# Patient Record
Sex: Female | Born: 1960
Health system: Southern US, Community
[De-identification: ages and names within clinical notes are randomized; demographics above are authoritative.]

## PROBLEM LIST (undated history)

## (undated) DIAGNOSIS — E079 Disorder of thyroid, unspecified: Secondary | ICD-10-CM

## (undated) DIAGNOSIS — I10 Essential (primary) hypertension: Secondary | ICD-10-CM

## (undated) DIAGNOSIS — I639 Cerebral infarction, unspecified: Secondary | ICD-10-CM

## (undated) DIAGNOSIS — B029 Zoster without complications: Secondary | ICD-10-CM

## (undated) HISTORY — DX: Zoster without complications: B02.9

## (undated) HISTORY — DX: Disorder of thyroid, unspecified: E07.9

## (undated) HISTORY — DX: Essential (primary) hypertension: I10

## (undated) HISTORY — DX: Cerebral infarction, unspecified: I63.9

---

## 2000-09-14 ENCOUNTER — Encounter: Admission: RE | Admit: 2000-09-14 | Discharge: 2000-09-14 | Payer: Self-pay | Admitting: Obstetrics and Gynecology

## 2000-09-14 ENCOUNTER — Encounter: Payer: Self-pay | Admitting: Obstetrics and Gynecology

## 2004-09-22 ENCOUNTER — Encounter: Admission: RE | Admit: 2004-09-22 | Discharge: 2004-09-22 | Payer: Self-pay | Admitting: Obstetrics and Gynecology

## 2005-12-15 ENCOUNTER — Encounter: Admission: RE | Admit: 2005-12-15 | Discharge: 2005-12-15 | Payer: Self-pay | Admitting: Obstetrics and Gynecology

## 2006-01-05 ENCOUNTER — Encounter: Admission: RE | Admit: 2006-01-05 | Discharge: 2006-01-05 | Payer: Self-pay | Admitting: Obstetrics and Gynecology

## 2009-04-22 ENCOUNTER — Encounter: Admission: RE | Admit: 2009-04-22 | Discharge: 2009-04-22 | Payer: Self-pay | Admitting: Obstetrics and Gynecology

## 2010-05-06 ENCOUNTER — Encounter: Admission: RE | Admit: 2010-05-06 | Discharge: 2010-05-06 | Payer: Self-pay | Admitting: Obstetrics and Gynecology

## 2011-01-11 ENCOUNTER — Encounter: Payer: Self-pay | Admitting: Obstetrics and Gynecology

## 2013-01-18 ENCOUNTER — Other Ambulatory Visit: Payer: Self-pay | Admitting: Family Medicine

## 2013-01-18 DIAGNOSIS — Z1231 Encounter for screening mammogram for malignant neoplasm of breast: Secondary | ICD-10-CM

## 2013-03-27 ENCOUNTER — Ambulatory Visit
Admission: RE | Admit: 2013-03-27 | Discharge: 2013-03-27 | Disposition: A | Discharge status: Home / Self Care | Payer: BC Managed Care – PPO | Source: Ambulatory Visit | Attending: Family Medicine | Admitting: Family Medicine

## 2013-03-27 DIAGNOSIS — Z1231 Encounter for screening mammogram for malignant neoplasm of breast: Secondary | ICD-10-CM

## 2013-04-11 ENCOUNTER — Encounter: Payer: Self-pay | Admitting: Nurse Practitioner

## 2013-04-11 ENCOUNTER — Ambulatory Visit (INDEPENDENT_AMBULATORY_CARE_PROVIDER_SITE_OTHER): Payer: BC Managed Care – PPO | Admitting: Nurse Practitioner

## 2013-04-11 VITALS — BP 153/98 | HR 98 | Temp 97.0°F | Ht 62.0 in | Wt 176.0 lb

## 2013-04-11 DIAGNOSIS — Z124 Encounter for screening for malignant neoplasm of cervix: Secondary | ICD-10-CM

## 2013-04-11 DIAGNOSIS — Z139 Encounter for screening, unspecified: Secondary | ICD-10-CM

## 2013-04-11 DIAGNOSIS — Z0000 Encounter for general adult medical examination without abnormal findings: Secondary | ICD-10-CM

## 2013-04-11 DIAGNOSIS — I10 Essential (primary) hypertension: Secondary | ICD-10-CM | POA: Insufficient documentation

## 2013-04-11 LAB — POCT CBC
Granulocyte percent: 72.5 %G (ref 37–80)
HCT, POC: 45 % (ref 37.7–47.9)
Hemoglobin: 15.4 g/dL (ref 12.2–16.2)
MCV: 88.4 fL (ref 80–97)
POC Granulocyte: 7.2 — AB (ref 2–6.9)
RBC: 5.1 M/uL (ref 4.04–5.48)
WBC: 10 10*3/uL (ref 4.6–10.2)

## 2013-04-11 LAB — COMPLETE METABOLIC PANEL WITH GFR
Albumin: 4.7 g/dL (ref 3.5–5.2)
BUN: 11 mg/dL (ref 6–23)
CO2: 28 mEq/L (ref 19–32)
Calcium: 10.2 mg/dL (ref 8.4–10.5)
Chloride: 96 mEq/L (ref 96–112)
GFR, Est Non African American: 81 mL/min
Glucose, Bld: 106 mg/dL — ABNORMAL HIGH (ref 70–99)
Potassium: 4 mEq/L (ref 3.5–5.3)
Sodium: 136 mEq/L (ref 135–145)
Total Protein: 8 g/dL (ref 6.0–8.3)

## 2013-04-11 LAB — THYROID PANEL WITH TSH
Free Thyroxine Index: 1.3 (ref 1.0–3.9)
T3 Uptake: 24.2 % (ref 22.5–37.0)

## 2013-04-11 LAB — POCT UA - MICROSCOPIC ONLY
Crystals, Ur, HPF, POC: NEGATIVE
Mucus, UA: NEGATIVE

## 2013-04-11 LAB — POCT URINALYSIS DIPSTICK
Glucose, UA: NEGATIVE
Ketones, UA: NEGATIVE
Spec Grav, UA: <=1.005
Urobilinogen, UA: NEGATIVE

## 2013-04-11 MED ORDER — HYDROCHLOROTHIAZIDE 25 MG PO TABS: 25.0000 mg | ORAL_TABLET | Freq: Every day | ORAL | Status: DC

## 2013-04-11 NOTE — Patient Instructions (Signed)

## 2013-04-11 NOTE — Progress Notes (Signed)
Subjective:    Patient ID: Brooke Newton, female    DOB: [birth year], 52 y.o.   MRN: [id number]  HPIPatient here today for a CPE. She has had Hypertension in the past and was taking Diazide. Patient lost a bunch of weight several years ago and stopped taking. Has gained some of that weight back B/P has now gone back up.     Review of Systems  Constitutional: Negative.   HENT: Negative.   Eyes:       Lesion right inner canthus \. Has been there for 4 months but has not changed in size.  Respiratory: Negative.   Cardiovascular: Negative.   Genitourinary: Negative.   Musculoskeletal: Negative.   Neurological: Negative.   Psychiatric/Behavioral: Negative.        Objective:   Physical Exam  Constitutional: She is oriented to person, place, and time. She appears well-developed and well-nourished.  HENT:  Head: Normocephalic.  Right Ear: Hearing, tympanic membrane, external ear and ear canal normal.  Left Ear: Hearing, tympanic membrane, external ear and ear canal normal.  Nose: Nose normal.  Mouth/Throat: Uvula is midline and oropharynx is clear and moist.  Eyes: Conjunctivae and EOM are normal. Pupils are equal, round, and reactive to light.  Neck: Normal range of motion and full passive range of motion without pain. Neck supple. No JVD present. Carotid bruit is not present. No mass and no thyromegaly present.  Cardiovascular: Normal rate, normal heart sounds and intact distal pulses.   No murmur heard. Pulmonary/Chest: Effort normal and breath sounds normal. Right breast exhibits no inverted nipple, no mass, no nipple discharge, no skin change and no tenderness. Left breast exhibits no inverted nipple, no mass, no nipple discharge, no skin change and no tenderness.  Abdominal: Soft. Bowel sounds are normal. She exhibits no mass. There is no tenderness.  Genitourinary: Vagina normal and uterus normal. No breast swelling, tenderness, discharge or bleeding.  bimanual exam-No adnexal  masses or tenderness.  Cervical stenosis and pink, no discharge  Musculoskeletal: Normal range of motion.  Lymphadenopathy:    She has no cervical adenopathy.  Neurological: She is alert and oriented to person, place, and time.  Skin: Skin is warm and dry.  Psychiatric: She has a normal mood and affect. Her behavior is normal. Judgment and thought content normal.    BP 153/98  Pulse 98  Temp(Src) 97 F (36.1 C) (Oral)  Ht 5\' 2"  (1.575 m)  Wt 176 lb (79.833 kg)  BMI 32.18 kg/m2 Results for orders placed in visit on 04/11/13  POCT URINALYSIS DIPSTICK      Result Value Range   Color, UA Clear     Clarity, UA Clear     Glucose, UA neg     Bilirubin, UA neg     Ketones, UA neg     Spec Grav, UA <=1.005     Blood, UA trace     pH, UA 7.0     Protein, UA neg     Urobilinogen, UA negative     Nitrite, UA neg     Leukocytes, UA large (3+)    POCT UA - MICROSCOPIC ONLY      Result Value Range   WBC, Ur, HPF, POC 1-5     RBC, urine, microscopic 1-3     Bacteria, U Microscopic occ     Mucus, UA neg     Epithelial cells, urine per micros few     Crystals, Ur, HPF, POC neg  Casts, Ur, LPF, POC neg     Yeast, UA neg           Assessment & Plan:  1. Annual physical exam Diet and exercise encouraged - POCT urinalysis dipstick - POCT UA - Microscopic Only - POCT CBC - COMPLETE METABOLIC PANEL WITH GFR - NMR Lipoprofile with Lipids - Thyroid Panel With TSH - Pap IG w/ reflex to HPV when ASC-U  2. Essential hypertension, benign Low Na+ diet - hydrochlorothiazide (HYDRODIURIL) 25 MG tablet; Take 1 tablet (25 mg total) by mouth daily.  Dispense: 90 tablet; Refill: 1 Colonoscopy scheduled for May 2014 Will schedule Dexa on way out todya Bennie Pierini, FNP

## 2013-04-12 LAB — NMR LIPOPROFILE WITH LIPIDS
HDL Size: 8.6 nm — ABNORMAL LOW (ref >=9.2)
LDL Particle Number: 2199 nmol/L — ABNORMAL HIGH (ref <1000)
Large HDL-P: 3.3 umol/L — ABNORMAL LOW (ref >=4.8)
Large VLDL-P: 4 nmol/L — ABNORMAL HIGH (ref <=2.7)
Small LDL Particle Number: 1155 nmol/L — ABNORMAL HIGH (ref <=527)
Triglycerides: 108 mg/dL (ref <150)
VLDL Size: 49.5 nm — ABNORMAL HIGH (ref <=46.6)

## 2013-04-13 LAB — PAP IG W/ RFLX HPV ASCU

## 2013-04-17 ENCOUNTER — Other Ambulatory Visit: Payer: Self-pay | Admitting: Nurse Practitioner

## 2013-04-17 LAB — HUMAN PAPILLOMAVIRUS, HIGH RISK: HPV DNA High Risk: NOT DETECTED

## 2013-04-17 MED ORDER — LEVOTHYROXINE SODIUM 75 MCG PO TABS: 75.0000 ug | ORAL_TABLET | Freq: Every day | ORAL | Status: DC

## 2013-05-16 ENCOUNTER — Encounter: Payer: Self-pay | Admitting: Physician Assistant

## 2013-05-16 ENCOUNTER — Ambulatory Visit (INDEPENDENT_AMBULATORY_CARE_PROVIDER_SITE_OTHER): Payer: BC Managed Care – PPO | Admitting: Physician Assistant

## 2013-05-16 VITALS — BP 156/87 | HR 81 | Temp 97.2°F | Ht 62.0 in | Wt 180.2 lb

## 2013-05-16 DIAGNOSIS — H9202 Otalgia, left ear: Secondary | ICD-10-CM

## 2013-05-16 DIAGNOSIS — E039 Hypothyroidism, unspecified: Secondary | ICD-10-CM

## 2013-05-16 DIAGNOSIS — H9209 Otalgia, unspecified ear: Secondary | ICD-10-CM

## 2013-05-16 NOTE — Patient Instructions (Signed)
Serous Otitis Media   Serous otitis media is also known as otitis media with effusion (OME). It means there is fluid in the middle ear space. This space contains the bones for hearing and air. Air in the middle ear space helps to transmit sound.   The air gets there through the eustachian tube. This tube goes from the back of the throat to the middle ear space. It keeps the pressure in the middle ear the same as the outside world. It also helps to drain fluid from the middle ear space.  CAUSES   OME occurs when the eustachian tube gets blocked. Blockage can come from:  · Ear infections.  · Colds and other upper respiratory infections.  · Allergies.  · Irritants such as cigarette smoke.  · Sudden changes in air pressure (such as descending in an airplane).  · Enlarged adenoids.  During colds and upper respiratory infections, the middle ear space can become temporarily filled with fluid. This can happen after an ear infection also. Once the infection clears, the fluid will generally drain out of the ear through the eustachian tube. If it does not, then OME occurs.  SYMPTOMS   · Hearing loss.  · A feeling of fullness in the ear  but no pain.  · Young children may not show any symptoms.  DIAGNOSIS   · Diagnosis of OME is made by an ear exam.  · Tests may be done to check on the movement of the eardrum.  · Hearing exams may be done.  TREATMENT   · The fluid most often goes away without treatment.  · If allergy is the cause, allergy treatment may be helpful.  · Fluid that persists for several months may require minor surgery. A small tube is placed in the ear drum to:  · Drain the fluid.  · Restore the air in the middle ear space.  · In certain situations, antibiotics are used to avoid surgery.  · Surgery may be done to remove enlarged adenoids (if this is the cause).  HOME CARE INSTRUCTIONS   · Keep children away from tobacco smoke.  · Be sure to keep follow up appointments, if any.  SEEK MEDICAL CARE IF:   · Hearing is  not better in 3 months.  · Hearing is worse.  · Ear pain.  · Drainage from the ear.  · Dizziness.  Document Released: 02/27/2004 Document Revised: 02/29/2012 Document Reviewed: 12/27/2008  [employer]® Patient Information ©2014 [employer].

## 2013-05-16 NOTE — Progress Notes (Signed)
Subjective:     Patient ID: Brooke Newton, female   DOB: Mar 20, 1961, 52 y.o.   MRN: [id number]  HPI Pt with intermit pain to the L and R ear L>R She states sx happen same time each yr Also with PND, scratchy throat, and dry cough No fever/chills No drainage from the ear  Review of Systems  All other systems reviewed and are negative.       Objective:   Physical Exam  Nursing note and vitals reviewed. HENT:  Right Ear: External ear normal. Tympanic membrane is not retracted and not bulging. A middle ear effusion is present.  Left Ear: External ear normal. Tympanic membrane is not retracted and not bulging. A middle ear effusion is present.  Nose: Nose normal.  Mouth/Throat: Uvula is midline and oropharynx is clear and moist. No oral lesions. No edematous. No oropharyngeal exudate, posterior oropharyngeal edema or posterior oropharyngeal erythema.  Cardiovascular: Normal rate, regular rhythm and normal heart sounds.   Pulmonary/Chest: Effort normal and breath sounds normal.  Lymphadenopathy:    She has no cervical adenopathy.       Assessment:     1. Ear ache, left        Plan:     OTC antihist Fluids rest Short course of Afrin Hold oral decongestants due to HTN F/U prn

## 2013-06-27 ENCOUNTER — Telehealth: Payer: Self-pay | Admitting: Nurse Practitioner

## 2013-06-27 DIAGNOSIS — Z13820 Encounter for screening for osteoporosis: Secondary | ICD-10-CM

## 2013-06-27 NOTE — Telephone Encounter (Signed)
Referral made- someone should be calling you

## 2013-06-28 ENCOUNTER — Other Ambulatory Visit: Payer: BC Managed Care – PPO

## 2013-06-28 ENCOUNTER — Ambulatory Visit: Payer: BC Managed Care – PPO

## 2013-07-05 ENCOUNTER — Encounter: Payer: Self-pay | Admitting: *Deleted

## 2013-07-26 ENCOUNTER — Ambulatory Visit: Payer: BC Managed Care – PPO

## 2013-07-26 ENCOUNTER — Other Ambulatory Visit: Payer: BC Managed Care – PPO

## 2013-08-02 ENCOUNTER — Ambulatory Visit: Payer: Self-pay

## 2013-08-02 ENCOUNTER — Other Ambulatory Visit: Payer: Self-pay

## 2013-08-09 ENCOUNTER — Other Ambulatory Visit: Payer: Self-pay

## 2013-08-09 ENCOUNTER — Ambulatory Visit: Payer: Self-pay

## 2013-09-18 ENCOUNTER — Other Ambulatory Visit: Payer: Self-pay | Admitting: Nurse Practitioner

## 2013-09-26 ENCOUNTER — Other Ambulatory Visit: Payer: Self-pay | Admitting: Nurse Practitioner

## 2013-09-27 NOTE — Telephone Encounter (Signed)
Last TSH on 04/17/13 says to recheck in 6 wks

## 2013-09-28 ENCOUNTER — Inpatient Hospital Stay (HOSPITAL_COMMUNITY)
Admission: EM | Admit: 2013-09-28 | Discharge: 2013-09-30 | DRG: 014 | Disposition: A | Discharge status: Home Health | Payer: BC Managed Care – PPO | Attending: Internal Medicine | Admitting: Internal Medicine

## 2013-09-28 ENCOUNTER — Emergency Department (HOSPITAL_COMMUNITY): Payer: BC Managed Care – PPO

## 2013-09-28 ENCOUNTER — Encounter (HOSPITAL_COMMUNITY): Payer: Self-pay | Admitting: Emergency Medicine

## 2013-09-28 ENCOUNTER — Observation Stay (HOSPITAL_COMMUNITY): Payer: BC Managed Care – PPO

## 2013-09-28 DIAGNOSIS — M6281 Muscle weakness (generalized): Secondary | ICD-10-CM

## 2013-09-28 DIAGNOSIS — I10 Essential (primary) hypertension: Secondary | ICD-10-CM | POA: Diagnosis present

## 2013-09-28 DIAGNOSIS — I639 Cerebral infarction, unspecified: Secondary | ICD-10-CM

## 2013-09-28 DIAGNOSIS — E669 Obesity, unspecified: Secondary | ICD-10-CM | POA: Diagnosis present

## 2013-09-28 DIAGNOSIS — G8190 Hemiplegia, unspecified affecting unspecified side: Secondary | ICD-10-CM | POA: Diagnosis present

## 2013-09-28 DIAGNOSIS — Z8673 Personal history of transient ischemic attack (TIA), and cerebral infarction without residual deficits: Secondary | ICD-10-CM | POA: Diagnosis present

## 2013-09-28 DIAGNOSIS — T502X5A Adverse effect of carbonic-anhydrase inhibitors, benzothiadiazides and other diuretics, initial encounter: Secondary | ICD-10-CM | POA: Diagnosis present

## 2013-09-28 DIAGNOSIS — E785 Hyperlipidemia, unspecified: Secondary | ICD-10-CM | POA: Diagnosis present

## 2013-09-28 DIAGNOSIS — I6350 Cerebral infarction due to unspecified occlusion or stenosis of unspecified cerebral artery: Principal | ICD-10-CM | POA: Diagnosis present

## 2013-09-28 DIAGNOSIS — I6930 Unspecified sequelae of cerebral infarction: Secondary | ICD-10-CM | POA: Diagnosis present

## 2013-09-28 DIAGNOSIS — R531 Weakness: Secondary | ICD-10-CM

## 2013-09-28 DIAGNOSIS — Z79899 Other long term (current) drug therapy: Secondary | ICD-10-CM

## 2013-09-28 DIAGNOSIS — I498 Other specified cardiac arrhythmias: Secondary | ICD-10-CM | POA: Diagnosis present

## 2013-09-28 DIAGNOSIS — E039 Hypothyroidism, unspecified: Secondary | ICD-10-CM | POA: Diagnosis present

## 2013-09-28 DIAGNOSIS — E876 Hypokalemia: Secondary | ICD-10-CM | POA: Diagnosis present

## 2013-09-28 LAB — POCT I-STAT TROPONIN I

## 2013-09-28 LAB — DIFFERENTIAL
Eosinophils Relative: 1 % (ref 0–5)
Lymphocytes Relative: 17 % (ref 12–46)
Lymphs Abs: 2 10*3/uL (ref 0.7–4.0)
Monocytes Absolute: 0.6 10*3/uL (ref 0.1–1.0)
Monocytes Relative: 5 % (ref 3–12)
Neutro Abs: 9 10*3/uL — ABNORMAL HIGH (ref 1.7–7.7)

## 2013-09-28 LAB — COMPREHENSIVE METABOLIC PANEL
ALT: 35 U/L (ref 0–35)
Albumin: 3.8 g/dL (ref 3.5–5.2)
Alkaline Phosphatase: 88 U/L (ref 39–117)
BUN: 8 mg/dL (ref 6–23)
CO2: 26 mEq/L (ref 19–32)
Chloride: 95 mEq/L — ABNORMAL LOW (ref 96–112)
GFR calc non Af Amer: >90 mL/min (ref >90)
Glucose, Bld: 118 mg/dL — ABNORMAL HIGH (ref 70–99)
Potassium: 2.7 mEq/L — CL (ref 3.5–5.1)
Sodium: 135 mEq/L (ref 135–145)
Total Protein: 7.7 g/dL (ref 6.0–8.3)

## 2013-09-28 LAB — CBC
HCT: 38.2 % (ref 36.0–46.0)
Hemoglobin: 12.9 g/dL (ref 12.0–15.0)
MCV: 86.6 fL (ref 78.0–100.0)
WBC: 11.6 10*3/uL — ABNORMAL HIGH (ref 4.0–10.5)

## 2013-09-28 LAB — APTT: aPTT: 26 seconds (ref 24–37)

## 2013-09-28 MED ORDER — ENOXAPARIN SODIUM 40 MG/0.4ML ~~LOC~~ SOLN
40.0000 mg | SUBCUTANEOUS | Status: DC
  Filled 2013-09-28: qty 0.4

## 2013-09-28 MED ORDER — ASPIRIN 325 MG PO TABS
325.0000 mg | ORAL_TABLET | Freq: Every day | ORAL | Status: DC
  Administered 2013-09-29 – 2013-09-30 (×2): 325 mg via ORAL
  Filled 2013-09-28 (×2): qty 1

## 2013-09-28 MED ORDER — ASPIRIN 300 MG RE SUPP: 300.0000 mg | Freq: Every day | RECTAL | Status: DC

## 2013-09-28 MED ORDER — ASPIRIN 325 MG PO TABS
325.0000 mg | ORAL_TABLET | Freq: Once | ORAL | Status: AC
  Administered 2013-09-28: 325 mg via ORAL
  Filled 2013-09-28: qty 1

## 2013-09-28 MED ORDER — POTASSIUM CHLORIDE CRYS ER 20 MEQ PO TBCR
40.0000 meq | EXTENDED_RELEASE_TABLET | Freq: Once | ORAL | Status: AC
  Administered 2013-09-28: 40 meq via ORAL
  Filled 2013-09-28: qty 2

## 2013-09-28 MED ORDER — ASPIRIN 300 MG RE SUPP
300.0000 mg | Freq: Every day | RECTAL | Status: DC
  Filled 2013-09-28 (×3): qty 1

## 2013-09-28 MED ORDER — STUDY - INVESTIGATIONAL DRUG SIMPLE RECORD
75.0000 mg | Freq: Every day | Status: DC
  Administered 2013-09-29 – 2013-09-30 (×2): 75 mg via ORAL
  Filled 2013-09-28 (×2): qty 75

## 2013-09-28 MED ORDER — STUDY - INVESTIGATIONAL DRUG SIMPLE RECORD
600.0000 mg | Status: AC
  Administered 2013-09-28: 600 mg via ORAL
  Filled 2013-09-28: qty 600

## 2013-09-28 MED ORDER — HYDROCHLOROTHIAZIDE 25 MG PO TABS
25.0000 mg | ORAL_TABLET | Freq: Every day | ORAL | Status: DC
  Administered 2013-09-29: 25 mg via ORAL
  Filled 2013-09-28 (×3): qty 1

## 2013-09-28 MED ORDER — SODIUM CHLORIDE 0.9 % IV BOLUS (SEPSIS)
1000.0000 mL | Freq: Once | INTRAVENOUS | Status: AC
  Administered 2013-09-28: 1000 mL via INTRAVENOUS

## 2013-09-28 MED ORDER — ACETAMINOPHEN 650 MG RE SUPP: 650.0000 mg | RECTAL | Status: DC | PRN

## 2013-09-28 MED ORDER — POTASSIUM CHLORIDE 10 MEQ/100ML IV SOLN
10.0000 meq | INTRAVENOUS | Status: AC
  Administered 2013-09-28: 10 meq via INTRAVENOUS
  Filled 2013-09-28: qty 100

## 2013-09-28 MED ORDER — ACETAMINOPHEN 325 MG PO TABS: 650.0000 mg | ORAL_TABLET | ORAL | Status: DC | PRN

## 2013-09-28 MED ORDER — LEVOTHYROXINE SODIUM 75 MCG PO TABS
75.0000 ug | ORAL_TABLET | Freq: Every day | ORAL | Status: DC
  Administered 2013-09-29 – 2013-09-30 (×2): 75 ug via ORAL
  Filled 2013-09-28 (×3): qty 1

## 2013-09-28 MED ORDER — SENNOSIDES-DOCUSATE SODIUM 8.6-50 MG PO TABS: 1.0000 | ORAL_TABLET | Freq: Every evening | ORAL | Status: DC | PRN

## 2013-09-28 MED ORDER — SODIUM CHLORIDE 0.9 % IV SOLN
INTRAVENOUS | Status: DC
  Administered 2013-09-28: 22:00:00 via INTRAVENOUS

## 2013-09-28 NOTE — ED Notes (Signed)
Pt stated her arm hurt from the K infusion, rate changed to 31ml/hr.

## 2013-09-28 NOTE — ED Notes (Signed)
CT read and interpreted by neurologist.

## 2013-09-28 NOTE — ED Notes (Signed)
Neurologist arrived while pt in CT

## 2013-09-28 NOTE — ED Notes (Signed)
Pt arrived in CT 

## 2013-09-28 NOTE — ED Notes (Signed)
Phlebotomist arrived at ED bridge.

## 2013-09-28 NOTE — ED Notes (Signed)
Stroke nurse arrived in pt room

## 2013-09-28 NOTE — ED Notes (Signed)
Pt arrived in room

## 2013-09-28 NOTE — H&P (Signed)
Patient's PCP: Rudi Heap, MD  Chief Complaint: Left-sided weakness and heaviness  History of Present Illness: Brooke Newton is a 52 y.o. Caucasian female with history of hypertension, obesity, and hypothyroidism who presents with the above complaints.  Patient reports that she was in her usual state of health until 4 p.m. today when she noted left-sided weakness and heaviness.  Code stroke was called and she was brought to the emergency department.  Patient was evaluated by neurology, TPA was not given due to her minimal symptoms.  During the emergency department stay, her left-sided weakness and heaviness have improved with still persistent.  Hospitalist service was asked to admit the patient for further care and management.  She denies any recent fevers, chills, nausea, vomiting, chest pain, shortness of breath, abdominal pain, diarrhea, headaches or vision changes.  Patient did not have any slurred speech or difficulty swallowing.  Review of Systems: All systems reviewed with the patient and positive as per history of present illness, otherwise all other systems are negative.  Past Medical History  Diagnosis Date  . Hypertension   . Thyroid disease     hypothyroidism  . Shingles At age 34   Past Surgical History  Procedure Laterality Date  . Cesarean section     Family History  Problem Relation Age of Onset  . Hypertension Mother   . Stroke Mother   . Hypertension Brother    History   Social History  . Marital Status: Married    Spouse Name: N/A    Number of Children: N/A  . Years of Education: N/A   Occupational History  . Not on file.   Social History Main Topics  . Smoking status: Never Smoker   . Smokeless tobacco: Never Used  . Alcohol Use: No  . Drug Use: No  . Sexual Activity: Yes   Other Topics Concern  . Not on file   Social History Narrative  . No narrative on file   Allergies: Review of patient's allergies indicates no known allergies.  Home  Meds: Prior to Admission medications   Medication Sig Start Date End Date Taking? Authorizing Provider  hydrochlorothiazide (HYDRODIURIL) 25 MG tablet TAKE 1 TABLET DAILY 09/18/13   Mary-Margaret Daphine Deutscher, FNP  levothyroxine (SYNTHROID, LEVOTHROID) 75 MCG tablet TAKE 1 TABLET DAILY 09/26/13   Mary-Margaret Daphine Deutscher, FNP    Physical Exam: Blood pressure 130/76, pulse 105, temperature 98.3 F (36.8 C), resp. rate 17, SpO2 98.00%. General: Awake, Oriented x3, No acute distress. HEENT: EOMI, Moist mucous membranes Neck: Supple CV: S1 and S2, slightly tachycardic. Lungs: Clear to ascultation bilaterally Abdomen: Soft, Nontender, Nondistended, +bowel sounds. Ext: Good pulses. Trace edema. No clubbing or cyanosis noted. Neuro: Cranial Nerves II-XII grossly intact. Has 5/5 motor strength in upper and lower extremities, except patient complaining of mild "heaviness"on the left.  Lab results:  Recent Labs  09/28/13 1802  NA 135  K 2.7*  CL 95*  CO2 26  GLUCOSE 118*  BUN 8  CREATININE 0.71  CALCIUM 9.5    Recent Labs  09/28/13 1802  AST 31  ALT 35  ALKPHOS 88  BILITOT 0.3  PROT 7.7  ALBUMIN 3.8   No results found for this basename: LIPASE, AMYLASE,  in the last 72 hours  Recent Labs  09/28/13 1802  WBC 11.6*  NEUTROABS 9.0*  HGB 12.9  HCT 38.2  MCV 86.6  PLT 313    Recent Labs  09/28/13 1743  TROPONINI <0.30   No components found with  this basename: POCBNP,  No results found for this basename: DDIMER,  in the last 72 hours No results found for this basename: HGBA1C,  in the last 72 hours No results found for this basename: CHOL, HDL, LDLCALC, TRIG, CHOLHDL, LDLDIRECT,  in the last 72 hours No results found for this basename: TSH, T4TOTAL, FREET3, T3FREE, THYROIDAB,  in the last 72 hours No results found for this basename: VITAMINB12, FOLATE, FERRITIN, TIBC, IRON, RETICCTPCT,  in the last 72 hours Imaging results:  Ct Head (brain) Wo Contrast  09/28/2013   CLINICAL  DATA:  Left-sided weakness.  Code stroke.  EXAM: CT HEAD WITHOUT CONTRAST  TECHNIQUE: Contiguous axial images were obtained from the base of the skull through the vertex without intravenous contrast.  COMPARISON:  None.  FINDINGS: The ventricles are normal in size and configuration. There are no parenchymal masses or mass effect. Minor periventricular white matter hypoattenuation is noted most consistent with chronic microvascular ischemic change.  There is no evidence of a recent transcortical infarct.  No extra-axial masses or abnormal fluid collections.  There is no intracranial hemorrhage.  The visualized sinuses and mastoid air cells are clear.  IMPRESSION: 1. No acute intracranial abnormalities. No intracranial hemorrhage. 2. Minor chronic microvascular ischemic change. Exam otherwise unremarkable. Critical Value/emergent results were called by telephone at the time of interpretation on 09/28/2013 at 5:47 PM to Dr.Yao, who verbally acknowledged these results.   Electronically Signed   By: Amie Portland M.D.   On: 09/28/2013 17:48   Other results: EKG: Sinus tachycardia with heart rate of 109.  Assessment & Plan by Problem: Left-sided weakness and heaviness Unclear etiology.  Will initiate stroke workup, will get an MRI of the brain, carotid Dopplers, 2-D echocardiogram.  Request physical therapy, occupational therapy and speech therapy consultation.  Start patient on aspirin.  Check lipid panel and hemoglobin A1c.  Continue neuro checks.  Hypertension Blood pressure slightly elevated, allow for permissive hypertension.  Will gently hydrate the patient with normal saline.  Continue hydrochlorothiazide for now.  Tachycardia Likely related to anxiety.  Continue to monitor.  Hypothyroidism Continue levothyroxine.  Check TSH and free T4 in the morning.  Hypokalemia Etiology unclear.  Replace as needed.  May be related to hydrochlorothiazide.  Check magnesium in the  morning.  Prophylaxis Lovenox.  CODE STATUS Full code  Disposition Admit the patient to telemetry as observation.  Time spent on admission, talking to the patient, and coordinating care was: 50 mins.  Dawnna Gritz A, MD 09/28/2013, 7:35 PM

## 2013-09-28 NOTE — ED Provider Notes (Signed)
CSN: [id number]     Arrival date & time 09/28/13  1725 History   First MD Initiated Contact with Patient 09/28/13 1727     Chief Complaint  Patient presents with  . Code Stroke   (Consider location/radiation/quality/duration/timing/severity/associated sxs/prior Treatment) The history is provided by the patient and the EMS personnel.  Brooke Newton is a 52 y.o. female hx of HTN, hypothyroidism here with L sided weakness and numbness. L arm weakness and numbness started acutely at 4:05 pm while she was working. Denies any speech problems. Denies blurry vision. Came in as code stroke. Has family history of stroke but no personal history of stroke.    Past Medical History  Diagnosis Date  . Hypertension   . Thyroid disease     hypothyroidism  . Shingles At age 89   Past Surgical History  Procedure Laterality Date  . Cesarean section     Family History  Problem Relation Age of Onset  . Hypertension Mother   . Stroke Mother   . Hypertension Brother    History  Substance Use Topics  . Smoking status: Never Smoker   . Smokeless tobacco: Never Used  . Alcohol Use: No   OB History   Grav Para Term Preterm Abortions TAB SAB Ect Mult Living                 Review of Systems  Neurological: Positive for weakness and numbness.  All other systems reviewed and are negative.    Allergies  Review of patient's allergies indicates no known allergies.  Home Medications   Current Outpatient Rx  Name  Route  Sig  Dispense  Refill  . hydrochlorothiazide (HYDRODIURIL) 25 MG tablet      TAKE 1 TABLET DAILY   90 tablet   1   . levothyroxine (SYNTHROID, LEVOTHROID) 75 MCG tablet      TAKE 1 TABLET DAILY   90 tablet   0    BP 130/76  Pulse 105  Temp(Src) 98.3 F (36.8 C)  Resp 17  SpO2 98% Physical Exam  Nursing note and vitals reviewed. Constitutional: She is oriented to person, place, and time. She appears well-developed and well-nourished.  HENT:  Head:  Normocephalic.  Mouth/Throat: Oropharynx is clear and moist.  Eyes: Conjunctivae are normal. Pupils are equal, round, and reactive to light.  Neck: Normal range of motion. Neck supple.  Cardiovascular: Normal rate, regular rhythm and normal heart sounds.   Pulmonary/Chest: Effort normal and breath sounds normal. No respiratory distress. She has no wheezes. She has no rales.  Abdominal: Soft. Bowel sounds are normal. She exhibits no distension. There is no tenderness. There is no rebound and no guarding.  Musculoskeletal: Normal range of motion.  Neurological: She is alert and oriented to person, place, and time.  No obvious facial droop. L sided weakness. + L pronator drift. Abnormal finger to nose on L side.   Skin: Skin is warm and dry.  Psychiatric: She has a normal mood and affect. Her behavior is normal. Judgment and thought content normal.    ED Course  Procedures (including critical care time) Labs Review Labs Reviewed  CBC - Abnormal; Notable for the following:    WBC 11.6 (*)    All other components within normal limits  DIFFERENTIAL - Abnormal; Notable for the following:    Neutro Abs 9.0 (*)    All other components within normal limits  COMPREHENSIVE METABOLIC PANEL - Abnormal; Notable for the following:  Potassium 2.7 (*)    Chloride 95 (*)    Glucose, Bld 118 (*)    All other components within normal limits  GLUCOSE, CAPILLARY - Abnormal; Notable for the following:    Glucose-Capillary 110 (*)    All other components within normal limits  PROTIME-INR  APTT  TROPONIN I  LIPID PANEL  HEMOGLOBIN A1C  POCT I-STAT TROPONIN I   Imaging Review Ct Head (brain) Wo Contrast  09/28/2013   CLINICAL DATA:  Left-sided weakness.  Code stroke.  EXAM: CT HEAD WITHOUT CONTRAST  TECHNIQUE: Contiguous axial images were obtained from the base of the skull through the vertex without intravenous contrast.  COMPARISON:  None.  FINDINGS: The ventricles are normal in size and  configuration. There are no parenchymal masses or mass effect. Minor periventricular white matter hypoattenuation is noted most consistent with chronic microvascular ischemic change.  There is no evidence of a recent transcortical infarct.  No extra-axial masses or abnormal fluid collections.  There is no intracranial hemorrhage.  The visualized sinuses and mastoid air cells are clear.  IMPRESSION: 1. No acute intracranial abnormalities. No intracranial hemorrhage. 2. Minor chronic microvascular ischemic change. Exam otherwise unremarkable. Critical Value/emergent results were called by telephone at the time of interpretation on 09/28/2013 at 5:47 PM to Dr.Yao, who verbally acknowledged these results.   Electronically Signed   By: Amie Portland M.D.   On: 09/28/2013 17:48    EKG Interpretation   None       Date: 09/28/2013  Rate: 109   Rhythm: sinus tachycardia  QRS Axis: normal  Intervals: normal  ST/T Wave abnormalities: nonspecific ST changes  Conduction Disutrbances:none  Narrative Interpretation:   Old EKG Reviewed: none available    MDM  No diagnosis found. Brooke Newton is a 52 y.o. female here with L sided weakness. Will need to r/o stroke. Neuro at bedside, CT head unremarkable.   7:34 PM Neuro recommend admission, doesn't think she needs TPA. Admit to Dr. Betti Cruz under tele.     Richardean Canal, MD 09/28/13 Barry Brunner

## 2013-09-28 NOTE — Consult Note (Signed)
Referring Physician: ED    Chief Complaint: CODE STROKE, LEFT SIDED PARESTHESIAS-WEAKNESS.  HPI:                                                                                                                                         Brooke Newton is an 52 y.o. female, right handed, with a past medical history significant for HTN, hypothyroidism, shingles, brought to [facility] ED as a code stroke due to acute onset left arm-leg paresthesias and weakness. Never had similar symptoms before. She was last known well at 4:05 pm today when she was at work and developed sudden onset of left arm tingling-numbness that rapidly traveled to the left leg. She said that the left arm and leg were " heavy and weak but was still able to use my hand". No HA, vertigo, double vision, difficulty swallowing, slurred speech, confusion, imbalance, language or vision impairment. NIHSS 1. CT brain unremarkable.   Date last known well: 09/28/13 Time last known well: 405 pm tPA Given: no, NIHSS 1 NIHSS: 1 MRS: 0  Past Medical History  Diagnosis Date  . Hypertension   . Thyroid disease     hypothyroidism  . Shingles At age 1    Past Surgical History  Procedure Laterality Date  . Cesarean section      Family History  Problem Relation Age of Onset  . Hypertension Mother   . Hypertension Brother    Social History:  reports that she has never smoked. She has never used smokeless tobacco. She reports that she does not drink alcohol or use illicit drugs.  Allergies: No Known Allergies  Medications:                                                                                                                           I have reviewed the patient's current medications.  ROS:  History obtained from the patient  General ROS: negative for - chills, fatigue, fever, night sweats,  weight gain or weight loss Psychological ROS: negative for - behavioral disorder, hallucinations, memory difficulties, mood swings or suicidal ideation Ophthalmic ROS: negative for - blurry vision, double vision, eye pain or loss of vision ENT ROS: negative for - epistaxis, nasal discharge, oral lesions, sore throat, tinnitus or vertigo Allergy and Immunology ROS: negative for - hives or itchy/watery eyes Hematological and Lymphatic ROS: negative for - bleeding problems, bruising or swollen lymph nodes Endocrine ROS: negative for - galactorrhea, hair pattern changes, polydipsia/polyuria or temperature intolerance Respiratory ROS: negative for - cough, hemoptysis, shortness of breath or wheezing Cardiovascular ROS: negative for - chest pain, dyspnea on exertion, edema or irregular heartbeat Gastrointestinal ROS: negative for - abdominal pain, diarrhea, hematemesis, nausea/vomiting or stool incontinence Genito-Urinary ROS: negative for - dysuria, hematuria, incontinence or urinary frequency/urgency Musculoskeletal ROS: negative for - joint swelling or muscular weakness Neurological ROS: as noted in HPI Dermatological ROS: negative for rash and skin lesion changes   Physical exam: pleasant female in no apparent distress. Blood pressure 169/99, pulse 111, resp. rate 21, SpO2 97.00%. Head: normocephalic. Neck: supple, no bruits, no JVD. Cardiac: no murmurs. Lungs: clear. Abdomen: soft, no tender, no mass. Extremities: no edema.    Neurologic Examination:                                                                                                      Mental Status: Alert, oriented, thought content appropriate.  Speech fluent without evidence of aphasia.  Able to follow 3 step commands without difficulty. Cranial Nerves: II: Discs flat bilaterally; Visual fields grossly normal, pupils equal, round, reactive to light and accommodation III,IV, VI: ptosis not present, extra-ocular motions  intact bilaterally V,VII: smile symmetric, facial light touch sensation normal bilaterally VIII: hearing normal bilaterally IX,X: gag reflex present XI: bilateral shoulder shrug XII: midline tongue extension Motor: Right : Upper extremity   5/5    Left:     Upper extremity   5/5  Lower extremity   5/5     Lower extremity   5/5 Tone and bulk:normal tone throughout; no atrophy noted Sensory: Pinprick and light touch intact throughout, bilaterally Deep Tendon Reflexes:  Right: Upper Extremity   Left: Upper extremity   biceps (C-5 to C-6) 2/4   biceps (C-5 to C-6) 2/4 tricep (C7) 2/4    triceps (C7) 2/4 Brachioradialis (C6) 2/4  Brachioradialis (C6) 2/4  Lower Extremity Lower Extremity  quadriceps (L-2 to L-4) 2/4   quadriceps (L-2 to L-4) 2/4 Achilles (S1) 2/4   Achilles (S1) 2/4  Plantars: Right: downgoing   Left: downgoing Cerebellar: normal finger-to-nose and normal heel-to-shin test in the right. Ataxic left upper limb. Gait:  No ataxia. CV: pulses palpable throughout    Results for orders placed during the hospital encounter of 09/28/13 (from the past 48 hour(s))  GLUCOSE, CAPILLARY     Status: Abnormal   Collection Time    09/28/13  5:45 PM      Result Value Range  Glucose-Capillary 110 (*) 70 - 99 mg/dL  POCT I-STAT TROPONIN I     Status: None   Collection Time    09/28/13  5:46 PM      Result Value Range   Troponin i, poc 0.00  0.00 - 0.08 ng/mL   Comment 3            Comment: Due to the release kinetics of cTnI,     a negative result within the first hours     of the onset of symptoms does not rule out     myocardial infarction with certainty.     If myocardial infarction is still suspected,     repeat the test at appropriate intervals.   Ct Head (brain) Wo Contrast  09/28/2013   CLINICAL DATA:  Left-sided weakness.  Code stroke.  EXAM: CT HEAD WITHOUT CONTRAST  TECHNIQUE: Contiguous axial images were obtained from the base of the skull through the vertex  without intravenous contrast.  COMPARISON:  None.  FINDINGS: The ventricles are normal in size and configuration. There are no parenchymal masses or mass effect. Minor periventricular white matter hypoattenuation is noted most consistent with chronic microvascular ischemic change.  There is no evidence of a recent transcortical infarct.  No extra-axial masses or abnormal fluid collections.  There is no intracranial hemorrhage.  The visualized sinuses and mastoid air cells are clear.  IMPRESSION: 1. No acute intracranial abnormalities. No intracranial hemorrhage. 2. Minor chronic microvascular ischemic change. Exam otherwise unremarkable. Critical Value/emergent results were called by telephone at the time of interpretation on 09/28/2013 at 5:47 PM to Dr.Yao, who verbally acknowledged these results.   Electronically Signed   By: Amie Portland M.D.   On: 09/28/2013 17:48     Assessment: 52 y.o. female with acute onset left sided paresthesias and weakness but NIHSS 1 (ataxic left upper limb). CT brain unremarkable. Discussed with research nurse on call: deemed a better candidate for POINT-TRIAL. Admit to medicine and complete stroke work up. Stroke team to resume care in am.  Stroke Risk Factors - HTN  Plan: 1. HgbA1c, fasting lipid panel 2. MRI, MRA  of the brain without contrast 3. Echocardiogram 4. Carotid dopplers 5. Prophylactic therapy-Antiplatelet med: Aspirin 81 MG DAILY 6. Risk factor modification 7. Telemetry monitoring 8. Frequent neuro checks 9. PT/OT SLP    Wyatt Portela, MD [facility] [phone]  09/28/2013, 6:03 PM

## 2013-09-28 NOTE — ED Notes (Addendum)
Attempted to give report nurse unanavilable at this time.

## 2013-09-28 NOTE — ED Notes (Signed)
Per ems, pt at work and c/o left side weakness and numbness in both leg and arm. Left arm drift, pt unable to complete finger to nose. Pt c/o abnormal gait. No facial droop. Pt AOx4. Pt not on blood thinners. Denies dizziness or blurred vision.

## 2013-09-28 NOTE — ED Notes (Signed)
EDP exam/ pt cleared for CT

## 2013-09-29 DIAGNOSIS — I6350 Cerebral infarction due to unspecified occlusion or stenosis of unspecified cerebral artery: Principal | ICD-10-CM

## 2013-09-29 DIAGNOSIS — I6330 Cerebral infarction due to thrombosis of unspecified cerebral artery: Secondary | ICD-10-CM

## 2013-09-29 DIAGNOSIS — I517 Cardiomegaly: Secondary | ICD-10-CM

## 2013-09-29 DIAGNOSIS — I6930 Unspecified sequelae of cerebral infarction: Secondary | ICD-10-CM | POA: Diagnosis present

## 2013-09-29 DIAGNOSIS — Z8673 Personal history of transient ischemic attack (TIA), and cerebral infarction without residual deficits: Secondary | ICD-10-CM | POA: Diagnosis present

## 2013-09-29 LAB — HEMOGLOBIN A1C: Hgb A1c MFr Bld: 6 % — ABNORMAL HIGH (ref <5.7)

## 2013-09-29 LAB — BASIC METABOLIC PANEL
Calcium: 9.7 mg/dL (ref 8.4–10.5)
Chloride: 102 mEq/L (ref 96–112)
Creatinine, Ser: 0.7 mg/dL (ref 0.50–1.10)
GFR calc Af Amer: >90 mL/min (ref >90)
GFR calc non Af Amer: >90 mL/min (ref >90)

## 2013-09-29 LAB — TSH: TSH: 5.986 u[IU]/mL — ABNORMAL HIGH (ref 0.350–4.500)

## 2013-09-29 LAB — LIPID PANEL
HDL: 46 mg/dL (ref >39)
LDL Cholesterol: 126 mg/dL — ABNORMAL HIGH (ref 0–99)
Total CHOL/HDL Ratio: 4.2 RATIO
Triglycerides: 97 mg/dL (ref <150)

## 2013-09-29 LAB — MAGNESIUM: Magnesium: 2 mg/dL (ref 1.5–2.5)

## 2013-09-29 MED ORDER — INFLUENZA VAC SPLIT QUAD 0.5 ML IM SUSP
0.5000 mL | INTRAMUSCULAR | Status: AC
Start: 2013-09-29 — End: 2013-09-29
  Administered 2013-09-29: 0.5 mL via INTRAMUSCULAR
  Filled 2013-09-29 (×2): qty 0.5

## 2013-09-29 MED ORDER — SIMVASTATIN 40 MG PO TABS
40.0000 mg | ORAL_TABLET | Freq: Every day | ORAL | Status: DC
  Administered 2013-09-29: 40 mg via ORAL
  Filled 2013-09-29 (×3): qty 1

## 2013-09-29 NOTE — Progress Notes (Signed)
[facility] PROGRESS NOTE  NYOKA ALCOSER RUE:[id number] DOB: February 20, 1961 DOA: 09/28/2013 PCP: Rudi Heap, MD  Assessment/Plan: 52 y.o. Caucasian female with history of hypertension, obesity, and hypothyroidism who presents with the L sided weakness found to have acute CVA   1. Acute CVA; MRI: Acute right centrum semiovale deep white matter and right lateral  thalamic acute infarction without hemorrhage -improving; started ASA; statin; pend echo, carotids; HA1C-6.0; tele no arrythmia  -appreciate neurology input   2. Hypo K likely diuretic related; replaced K; hold diuretic   3. Hypo thyroidism, pend TSH; cont synthroid   4. HTN hold hctz; permissive htn with CVA   Code Status: full  Family Communication: husband at the bedside  (indicate person spoken with, relationship, and if by phone, the number) Disposition Plan: pend PT eval    Consultants:  Neurology   Procedures:  MRI   Antibiotics:  None   (indicate start date, and stop date if known)  HPI/Subjective: alert  Objective: Filed Vitals:   09/29/13 0700  BP: 114/64  Pulse: 88  Temp: 97.9 F (36.6 C)  Resp: 18    Intake/Output Summary (Last 24 hours) at 09/29/13 0806 Last data filed at 09/28/13 2109  Gross per 24 hour  Intake   1100 ml  Output      0 ml  Net   1100 ml   Filed Weights   09/28/13 2300  Weight: 79.833 kg (176 lb)    Exam:   General:  Alert   Cardiovascular: s1,s2 rrr  Respiratory: cta bl   Abdomen: soft, nt nd   Musculoskeletal: no edema    Data Reviewed: Basic Metabolic Panel:  Recent Labs Lab 09/28/13 1802 09/29/13 0645  NA 135 139  K 2.7* 3.5  CL 95* 102  CO2 26 24  GLUCOSE 118* 102*  BUN 8 6  CREATININE 0.71 0.70  CALCIUM 9.5 9.7  MG  --  2.0   Liver Function Tests:  Recent Labs Lab 09/28/13 1802  AST 31  ALT 35  ALKPHOS 88  BILITOT 0.3  PROT 7.7  ALBUMIN 3.8   No results found for this basename: LIPASE, AMYLASE,  in the last 168  hours No results found for this basename: AMMONIA,  in the last 168 hours CBC:  Recent Labs Lab 09/28/13 1802  WBC 11.6*  NEUTROABS 9.0*  HGB 12.9  HCT 38.2  MCV 86.6  PLT 313   Cardiac Enzymes:  Recent Labs Lab 09/28/13 1743  TROPONINI <0.30   BNP (last 3 results) No results found for this basename: PROBNP,  in the last 8760 hours CBG:  Recent Labs Lab 09/28/13 1745  GLUCAP 110*    No results found for this or any previous visit (from the past 240 hour(s)).   Studies: Dg Chest 2 View  09/29/2013   CLINICAL DATA:  Stroke, left-sided weakness  EXAM: CHEST  2 VIEW  COMPARISON:  None.  FINDINGS: Mild central bronchitic changes. Minimal left basilar atelectasis. The lungs are otherwise clear. Cardiac and mediastinal contours are within normal limits. No pneumothorax or pleural effusion. No edema. No acute osseous abnormality.  IMPRESSION: Subsegmental left basilar atelectasis. Central bronchitic changes. Otherwise, no acute cardiopulmonary process.   Electronically Signed   By: Malachy Moan M.D.   On: 09/29/2013 01:29   Ct Head (brain) Wo Contrast  09/28/2013   CLINICAL DATA:  Left-sided weakness.  Code stroke.  EXAM: CT HEAD WITHOUT CONTRAST  TECHNIQUE: Contiguous axial images were obtained from the base  of the skull through the vertex without intravenous contrast.  COMPARISON:  None.  FINDINGS: The ventricles are normal in size and configuration. There are no parenchymal masses or mass effect. Minor periventricular white matter hypoattenuation is noted most consistent with chronic microvascular ischemic change.  There is no evidence of a recent transcortical infarct.  No extra-axial masses or abnormal fluid collections.  There is no intracranial hemorrhage.  The visualized sinuses and mastoid air cells are clear.  IMPRESSION: 1. No acute intracranial abnormalities. No intracranial hemorrhage. 2. Minor chronic microvascular ischemic change. Exam otherwise unremarkable.  Critical Value/emergent results were called by telephone at the time of interpretation on 09/28/2013 at 5:47 PM to Dr.Yao, who verbally acknowledged these results.   Electronically Signed   By: Amie Portland M.D.   On: 09/28/2013 17:48   Mr Maxine Glenn Head Wo Contrast  09/28/2013   CLINICAL DATA:  Left-sided weakness and numbness. No speech problems. Stroke risk factors include hypertension.  EXAM: MRI HEAD WITHOUT CONTRAST  MRA HEAD WITHOUT CONTRAST  TECHNIQUE: Multiplanar, multiecho pulse sequences of the brain and surrounding structures were obtained without intravenous contrast. Angiographic images of the head were obtained using MRA technique without contrast.  COMPARISON:  CT head 09/28/2013.  FINDINGS: MRI HEAD FINDINGS  Acute right centrum semiovale deep white matter and right lateral thalamus acute infarction lies within the right MCA lenticulostriate territory. No visible acute hemorrhage.  Normal cerebral volume. Prominent perivascular spaces. No large vessel infarct.  Premature white matter signal abnormality, periventricular greater than subcortical in location, most consistent with chronic microvascular ischemic change in this patient with hypertension. No foci of chronic hemorrhage.  Calvarium, skull base, and upper cervical region unremarkable. Scalp and extracranial soft tissues within normal limits  Negative orbits. Mild paranasal sinus disease in the ethmoids without air-fluid level. No acute mastoid fluid.  Compared with the prior CT, the infarct is not visible.  MRA HEAD FINDINGS  The skull base, petrous, cavernous, and supraclinoid internal carotid arteries are widely patent. The right A1 anterior cerebral artery is diffusely diseased. The left A1 ACA is widely patent.  Both proximal middle cerebral arteries are widely patent without stenosis or irregularity. No MCA branch occlusion.  The basilar artery is widely patent with the left vertebral dominant. Right vertebral ends in PICA.  The right  posterior cerebral artery originates from the basilar, but the left originates from the carotid resulting in mild basilar hypoplasia.  Mild irregularity of the distal ACA, MCA, and PCA vessels consistent with intracranial atherosclerotic change.  No intracranial aneurysm. No cerebellar branch occlusion.  IMPRESSION: MRI HEAD IMPRESSION  Acute right centrum semiovale deep white matter and right lateral thalamic acute infarction without hemorrhage.  Premature white matter T2 signal abnormality, likely small vessel disease.  MRA HEAD IMPRESSION  Mild intracranial atherosclerotic change without visible proximal stenosis; specifically right ICA and right MCA appear normal.   Electronically Signed   By: Davonna Belling M.D.   On: 09/28/2013 20:21   Mr Brain Wo Contrast  09/28/2013   CLINICAL DATA:  Left-sided weakness and numbness. No speech problems. Stroke risk factors include hypertension.  EXAM: MRI HEAD WITHOUT CONTRAST  MRA HEAD WITHOUT CONTRAST  TECHNIQUE: Multiplanar, multiecho pulse sequences of the brain and surrounding structures were obtained without intravenous contrast. Angiographic images of the head were obtained using MRA technique without contrast.  COMPARISON:  CT head 09/28/2013.  FINDINGS: MRI HEAD FINDINGS  Acute right centrum semiovale deep white matter and right lateral thalamus acute infarction  lies within the right MCA lenticulostriate territory. No visible acute hemorrhage.  Normal cerebral volume. Prominent perivascular spaces. No large vessel infarct.  Premature white matter signal abnormality, periventricular greater than subcortical in location, most consistent with chronic microvascular ischemic change in this patient with hypertension. No foci of chronic hemorrhage.  Calvarium, skull base, and upper cervical region unremarkable. Scalp and extracranial soft tissues within normal limits  Negative orbits. Mild paranasal sinus disease in the ethmoids without air-fluid level. No acute mastoid  fluid.  Compared with the prior CT, the infarct is not visible.  MRA HEAD FINDINGS  The skull base, petrous, cavernous, and supraclinoid internal carotid arteries are widely patent. The right A1 anterior cerebral artery is diffusely diseased. The left A1 ACA is widely patent.  Both proximal middle cerebral arteries are widely patent without stenosis or irregularity. No MCA branch occlusion.  The basilar artery is widely patent with the left vertebral dominant. Right vertebral ends in PICA.  The right posterior cerebral artery originates from the basilar, but the left originates from the carotid resulting in mild basilar hypoplasia.  Mild irregularity of the distal ACA, MCA, and PCA vessels consistent with intracranial atherosclerotic change.  No intracranial aneurysm. No cerebellar branch occlusion.  IMPRESSION: MRI HEAD IMPRESSION  Acute right centrum semiovale deep white matter and right lateral thalamic acute infarction without hemorrhage.  Premature white matter T2 signal abnormality, likely small vessel disease.  MRA HEAD IMPRESSION  Mild intracranial atherosclerotic change without visible proximal stenosis; specifically right ICA and right MCA appear normal.   Electronically Signed   By: Davonna Belling M.D.   On: 09/28/2013 20:21    Scheduled Meds: . aspirin  300 mg Rectal Daily   Or  . aspirin  325 mg Oral Daily  . hydrochlorothiazide  25 mg Oral Daily  . influenza vac split quadrivalent PF  0.5 mL Intramuscular Tomorrow-1000  . levothyroxine  75 mcg Oral QAC breakfast  . POINT study - Placebo / clopidogrel (daily dose)  (PI-Sethi)  75 mg Oral Q breakfast   Continuous Infusions: . sodium chloride 75 mL/hr at 09/28/13 2224    Principal Problem:   Left-sided weakness Active Problems:   Essential hypertension, benign   Unspecified hypothyroidism   Hypokalemia    Time spent: > 35 minutes     Esperanza Sheets  [facility] Pager [phone]. If 7PM-7AM, please contact night-coverage  at [email or web address], password [email or web address] 09/29/2013, 8:06 AM  LOS: 1 day

## 2013-09-29 NOTE — Evaluation (Addendum)
Physical Therapy Evaluation Patient Details Name: Brooke Newton MRN: [id number] DOB: 10-13-1961 Today's Date: 09/29/2013 Time: 1041-1100 PT Time Calculation (min): 19 min  PT Assessment / Plan / Recommendation History of Present Illness  pt presents with R Centrum Semiovale and R Lateral Thalmic Infarcts.    Clinical Impression  Pt presents with L sided deficits.  Pt would be great candidate for CIR to maximize independence prior to returning home with family.  Gave pt L LE there ex and coordination exercises.  Will continue to follow.      PT Assessment  Patient needs continued PT services    Follow Up Recommendations  CIR    Does the patient have the potential to tolerate intense rehabilitation      Barriers to Discharge        Equipment Recommendations  None recommended by PT    Recommendations for Other Services Rehab consult   Frequency Min 4X/week    Precautions / Restrictions Precautions Precautions: Fall Restrictions Weight Bearing Restrictions: No   Pertinent Vitals/Pain Denied pain.        Mobility  Bed Mobility Bed Mobility: Supine to Sit;Sitting - Scoot to Edge of Bed Supine to Sit: 5: Supervision;With rails;HOB elevated Sitting - Scoot to Edge of Bed: 5: Supervision Details for Bed Mobility Assistance: pt requires increased time and use of rail to complete bed mobility without physical A.   Transfers Transfers: Sit to Stand;Stand to Sit Sit to Stand: 4: Min assist;With upper extremity assist;From bed Stand to Sit: 4: Min assist;With upper extremity assist;To chair/3-in-1 Details for Transfer Assistance: cues for use of Bil UEs and positioning bil LEs.   Ambulation/Gait Ambulation/Gait Assistance: 3: Mod assist Ambulation Distance (Feet): 30 Feet Assistive device: 1 person hand held assist Ambulation/Gait Assistance Details: pt L LE buckling and fatigues quickly.  L foot supinates and decreased heel strike.   Gait Pattern: Step-to  pattern;Decreased step length - right;Decreased stance time - left;Decreased dorsiflexion - left;Decreased weight shift to left;Ataxic Stairs: No Wheelchair Mobility Wheelchair Mobility: No Modified Rankin (Stroke Patients Only) Pre-Morbid Rankin Score: No symptoms Modified Rankin: Moderately severe disability    Exercises     PT Diagnosis: Difficulty walking;Hemiplegia non-dominant side  PT Problem List: Decreased strength;Decreased activity tolerance;Decreased balance;Decreased mobility;Decreased coordination;Decreased knowledge of use of DME PT Treatment Interventions: DME instruction;Gait training;Functional mobility training;Stair training;Therapeutic activities;Therapeutic exercise;Balance training;Neuromuscular re-education;Patient/family education     PT Goals(Current goals can be found in the care plan section) Acute Rehab PT Goals Patient Stated Goal: Home with grandchild PT Goal Formulation: With patient Time For Goal Achievement: 10/13/13 Potential to Achieve Goals: Good  Visit Information  Last PT Received On: 09/29/13 Assistance Needed: +1 Reason Eval/Treat Not Completed: Patient at procedure or test/unavailable History of Present Illness: pt presents with R Centrum Semiovale and R Lateral Thalmic Infarcts.         Prior Functioning  Home Living Family/patient expects to be discharged to:: Private residence Living Arrangements: Spouse/significant other;Children Available Help at Discharge: Family;Available 24 hours/day Type of Home: House Home Access: Stairs to enter [employer] of Steps: 3 Entrance Stairs-Rails: None Home Layout: Two level (split level) Alternate Level Stairs-Number of Steps: 6 Alternate Level Stairs-Rails: Right Home Equipment: None Prior Function Level of Independence: Independent Communication Communication: No difficulties Dominant Hand: Right    Cognition  Cognition Arousal/Alertness: Awake/alert Behavior During  Therapy: WFL for tasks assessed/performed Overall Cognitive Status: Within Functional Limits for tasks assessed    Extremity/Trunk Assessment Upper Extremity Assessment Upper Extremity  Assessment: Defer to OT evaluation Lower Extremity Assessment Lower Extremity Assessment: LLE deficits/detail LLE Deficits / Details: Hip/knee strength grossly 4-/5 with ankle/foot 3/5.   LLE Coordination: decreased fine motor;decreased gross motor   Balance Balance Balance Assessed: Yes Static Standing Balance Static Standing - Balance Support: No upper extremity supported Static Standing - Level of Assistance: 4: Min assist  End of Session PT - End of Session Equipment Utilized During Treatment: Gait belt Activity Tolerance: Patient tolerated treatment well Patient left: in chair;with call bell/phone within reach;with family/visitor present Nurse Communication: Mobility status  GP     Sunny Schlein, [place] [phone] 09/29/2013, 11:10 AM

## 2013-09-29 NOTE — Progress Notes (Signed)
Utilization review completed. Madge Therrien, RN, BSN. 

## 2013-09-29 NOTE — Progress Notes (Cosign Needed)
PT/OT Cancellation Note  Patient Details Name: Brooke Newton MRN: [id number] DOB: 11-29-1961   Cancelled Treatment:    Reason Eval/Treat Not Completed: Patient at procedure or test/unavailable.  Pt currently at Echo.  Will try another time.     Sunny Schlein, [place] [phone] 09/29/2013, 10:06 AM

## 2013-09-29 NOTE — Progress Notes (Addendum)
Stroke Team Progress Note  HISTORY Brooke Newton is an 52 y.o. female, right handed, with a past medical history significant for HTN, hypothyroidism, shingles, brought to [facility] ED as a code stroke due to acute onset left arm-leg paresthesias and weakness.  Never had similar symptoms before.   She was last known well at 4:05 pm today when she was at work and developed sudden onset of left arm tingling-numbness that rapidly traveled to the left leg. She said that the left arm and leg were " heavy and weak but was still able to use my hand".  No HA, vertigo, double vision, difficulty swallowing, slurred speech, confusion, imbalance, language or vision impairment.  NIHSS 1.   CT brain unremarkable.   Date last known well: 09/28/13  Time last known well: 405 pm  tPA Given: no, NIHSS 1  NIHSS: 1  MRS: 0   Patient was not a TPA candidate secondary to rapidly improving symptoms. She was admitted for further evaluation and treatment.  SUBJECTIVE Her family is at the bedside.  Overall she feels her condition is rapidly improving.   OBJECTIVE Most recent Vital Signs: Filed Vitals:   09/28/13 2239 09/28/13 2300 09/29/13 0200 09/29/13 0700  BP: 152/92  126/79 114/64  Pulse: 103  73 88  Temp: 98.3 F (36.8 C)  98.4 F (36.9 C) 97.9 F (36.6 C)  TempSrc: Oral  Oral Oral  Resp: 18  19 18   Height:  5\' 2"  (1.575 m)    Weight:  79.833 kg (176 lb)    SpO2: 96%  97% 98%   CBG (last 3)   Recent Labs  09/28/13 1745  GLUCAP 110*    IV Fluid Intake:     MEDICATIONS  . aspirin  300 mg Rectal Daily   Or  . aspirin  325 mg Oral Daily  . influenza vac split quadrivalent PF  0.5 mL Intramuscular Tomorrow-1000  . levothyroxine  75 mcg Oral QAC breakfast  . POINT study - Placebo / clopidogrel (daily dose)  (PI-Sethi)  75 mg Oral Q breakfast  . simvastatin  40 mg Oral q1800   PRN:  acetaminophen, acetaminophen, senna-docusate  Diet:  Cardiac thin liquids Activity: out of bed with  assist DVT Prophylaxis:  SCD  CLINICALLY SIGNIFICANT STUDIES Basic Metabolic Panel:  Recent Labs Lab 09/28/13 1802 09/29/13 0645  NA 135 139  K 2.7* 3.5  CL 95* 102  CO2 26 24  GLUCOSE 118* 102*  BUN 8 6  CREATININE 0.71 0.70  CALCIUM 9.5 9.7  MG  --  2.0   Liver Function Tests:  Recent Labs Lab 09/28/13 1802  AST 31  ALT 35  ALKPHOS 88  BILITOT 0.3  PROT 7.7  ALBUMIN 3.8   CBC:  Recent Labs Lab 09/28/13 1802  WBC 11.6*  NEUTROABS 9.0*  HGB 12.9  HCT 38.2  MCV 86.6  PLT 313   Coagulation:  Recent Labs Lab 09/28/13 1802  LABPROT 13.0  INR 1.00   Cardiac Enzymes:  Recent Labs Lab 09/28/13 1743  TROPONINI <0.30   Urinalysis: No results found for this basename: COLORURINE, APPERANCEUR, LABSPEC, PHURINE, GLUCOSEU, HGBUR, BILIRUBINUR, KETONESUR, PROTEINUR, UROBILINOGEN, NITRITE, LEUKOCYTESUR,  in the last 168 hours Lipid Panel    Component Value Date/Time   CHOL 191 09/29/2013 0645   TRIG 97 09/29/2013 0645   TRIG 108 04/11/2013 1205   HDL 46 09/29/2013 0645   CHOLHDL 4.2 09/29/2013 0645   VLDL 19 09/29/2013 0645   LDLCALC 126* 09/29/2013  0645   LDLCALC 159* 04/11/2013 1205   HgbA1C  Lab Results  Component Value Date   HGBA1C 6.0* 09/28/2013    Urine Drug Screen:   No results found for this basename: labopia, cocainscrnur, labbenz, amphetmu, thcu, labbarb    Alcohol Level: No results found for this basename: ETH,  in the last 168 hours  Dg Chest 2 View  09/29/2013   CLINICAL DATA:  Stroke, left-sided weakness  EXAM: CHEST  2 VIEW  COMPARISON:  None.  FINDINGS: Mild central bronchitic changes. Minimal left basilar atelectasis. The lungs are otherwise clear. Cardiac and mediastinal contours are within normal limits. No pneumothorax or pleural effusion. No edema. No acute osseous abnormality.  IMPRESSION: Subsegmental left basilar atelectasis. Central bronchitic changes. Otherwise, no acute cardiopulmonary process.   Electronically Signed   By:  Malachy Moan M.D.   On: 09/29/2013 01:29   Ct Head (brain) Wo Contrast  09/28/2013   CLINICAL DATA:  Left-sided weakness.  Code stroke.  EXAM: CT HEAD WITHOUT CONTRAST  TECHNIQUE: Contiguous axial images were obtained from the base of the skull through the vertex without intravenous contrast.  COMPARISON:  None.  FINDINGS: The ventricles are normal in size and configuration. There are no parenchymal masses or mass effect. Minor periventricular white matter hypoattenuation is noted most consistent with chronic microvascular ischemic change.  There is no evidence of a recent transcortical infarct.  No extra-axial masses or abnormal fluid collections.  There is no intracranial hemorrhage.  The visualized sinuses and mastoid air cells are clear.  IMPRESSION: 1. No acute intracranial abnormalities. No intracranial hemorrhage. 2. Minor chronic microvascular ischemic change. Exam otherwise unremarkable. Critical Value/emergent results were called by telephone at the time of interpretation on 09/28/2013 at 5:47 PM to Dr.Yao, who verbally acknowledged these results.   Electronically Signed   By: Amie Portland M.D.   On: 09/28/2013 17:48   Mr Maxine Glenn Head Wo Contrast  09/28/2013   CLINICAL DATA:  Left-sided weakness and numbness. No speech problems. Stroke risk factors include hypertension.  EXAM: MRI HEAD WITHOUT CONTRAST  MRA HEAD WITHOUT CONTRAST  TECHNIQUE: Multiplanar, multiecho pulse sequences of the brain and surrounding structures were obtained without intravenous contrast. Angiographic images of the head were obtained using MRA technique without contrast.  COMPARISON:  CT head 09/28/2013.  FINDINGS: MRI HEAD FINDINGS  Acute right centrum semiovale deep white matter and right lateral thalamus acute infarction lies within the right MCA lenticulostriate territory. No visible acute hemorrhage.  Normal cerebral volume. Prominent perivascular spaces. No large vessel infarct.  Premature white matter signal  abnormality, periventricular greater than subcortical in location, most consistent with chronic microvascular ischemic change in this patient with hypertension. No foci of chronic hemorrhage.  Calvarium, skull base, and upper cervical region unremarkable. Scalp and extracranial soft tissues within normal limits  Negative orbits. Mild paranasal sinus disease in the ethmoids without air-fluid level. No acute mastoid fluid.  Compared with the prior CT, the infarct is not visible.  MRA HEAD FINDINGS  The skull base, petrous, cavernous, and supraclinoid internal carotid arteries are widely patent. The right A1 anterior cerebral artery is diffusely diseased. The left A1 ACA is widely patent.  Both proximal middle cerebral arteries are widely patent without stenosis or irregularity. No MCA branch occlusion.  The basilar artery is widely patent with the left vertebral dominant. Right vertebral ends in PICA.  The right posterior cerebral artery originates from the basilar, but the left originates from the carotid resulting in mild basilar  hypoplasia.  Mild irregularity of the distal ACA, MCA, and PCA vessels consistent with intracranial atherosclerotic change.  No intracranial aneurysm. No cerebellar branch occlusion.  IMPRESSION: MRI HEAD IMPRESSION  Acute right centrum semiovale deep white matter and right lateral thalamic acute infarction without hemorrhage.  Premature white matter T2 signal abnormality, likely small vessel disease.  MRA HEAD IMPRESSION  Mild intracranial atherosclerotic change without visible proximal stenosis; specifically right ICA and right MCA appear normal.   Electronically Signed   By: Davonna Belling M.D.   On: 09/28/2013 20:21   Mr Brain Wo Contrast  09/28/2013   CLINICAL DATA:  Left-sided weakness and numbness. No speech problems. Stroke risk factors include hypertension.  EXAM: MRI HEAD WITHOUT CONTRAST  MRA HEAD WITHOUT CONTRAST  TECHNIQUE: Multiplanar, multiecho pulse sequences of the brain  and surrounding structures were obtained without intravenous contrast. Angiographic images of the head were obtained using MRA technique without contrast.  COMPARISON:  CT head 09/28/2013.  FINDINGS: MRI HEAD FINDINGS  Acute right centrum semiovale deep white matter and right lateral thalamus acute infarction lies within the right MCA lenticulostriate territory. No visible acute hemorrhage.  Normal cerebral volume. Prominent perivascular spaces. No large vessel infarct.  Premature white matter signal abnormality, periventricular greater than subcortical in location, most consistent with chronic microvascular ischemic change in this patient with hypertension. No foci of chronic hemorrhage.  Calvarium, skull base, and upper cervical region unremarkable. Scalp and extracranial soft tissues within normal limits  Negative orbits. Mild paranasal sinus disease in the ethmoids without air-fluid level. No acute mastoid fluid.  Compared with the prior CT, the infarct is not visible.  MRA HEAD FINDINGS  The skull base, petrous, cavernous, and supraclinoid internal carotid arteries are widely patent. The right A1 anterior cerebral artery is diffusely diseased. The left A1 ACA is widely patent.  Both proximal middle cerebral arteries are widely patent without stenosis or irregularity. No MCA branch occlusion.  The basilar artery is widely patent with the left vertebral dominant. Right vertebral ends in PICA.  The right posterior cerebral artery originates from the basilar, but the left originates from the carotid resulting in mild basilar hypoplasia.  Mild irregularity of the distal ACA, MCA, and PCA vessels consistent with intracranial atherosclerotic change.  No intracranial aneurysm. No cerebellar branch occlusion.  IMPRESSION: MRI HEAD IMPRESSION  Acute right centrum semiovale deep white matter and right lateral thalamic acute infarction without hemorrhage.  Premature white matter T2 signal abnormality, likely small vessel  disease.  MRA HEAD IMPRESSION  Mild intracranial atherosclerotic change without visible proximal stenosis; specifically right ICA and right MCA appear normal.   Electronically Signed   By: Davonna Belling M.D.   On: 09/28/2013 20:21   2D Echocardiogram    Carotid Doppler  Bilateral carotid artery duplex: 1-39% ICA stenosis. Vertebral artery flow is antegrade.   EKG  normal sinus rhythm.   Therapy Recommendations CIR  Physical Exam   Mental Status:  Alert, oriented, thought content appropriate. Speech fluent without evidence of aphasia. Able to follow 3 step commands without difficulty.  Cranial Nerves:  II: Discs flat bilaterally; Visual fields grossly normal, pupils equal, round, reactive to light and accommodation  III,IV, VI: ptosis not present, extra-ocular motions intact bilaterally  V,VII: smile symmetric, facial light touch sensation normal bilaterally  VIII: hearing normal bilaterally  IX,X: gag reflex present  XI: bilateral shoulder shrug  XII: midline tongue extension  Motor:  Right : Upper extremity 5/5 Left: Upper extremity 5/5  Lower  extremity 5/5 Lower extremity 5/5  Tone and bulk:normal tone throughout; no atrophy noted  Sensory: Pinprick and light touch intact throughout, bilaterally  Deep Tendon Reflexes:  Right: Upper Extremity Left: Upper extremity  biceps (C-5 to C-6) 2/4 biceps (C-5 to C-6) 2/4  tricep (C7) 2/4 triceps (C7) 2/4  Brachioradialis (C6) 2/4 Brachioradialis (C6) 2/4  Lower Extremity Lower Extremity  quadriceps (L-2 to L-4) 2/4 quadriceps (L-2 to L-4) 2/4  Achilles (S1) 2/4 Achilles (S1) 2/4  Plantars:  Right: downgoing Left: downgoing  Cerebellar:  normal finger-to-nose and normal heel-to-shin test in the right. Ataxic left upper limb.  Gait:  No ataxia.  CV: pulses palpable throughout   NIHSS 1  ASSESSMENT Brooke Newton is a 52 y.o. female presenting with left hemiparesis/hemi-sensory loss. Imaging confirms a right thalamic infarct.  Infarct felt to be embolic secondary to unknown source.  On no antithrombotics prior to admission. Now on aspirin 325 mg orally every day and POINT study for secondary stroke prevention. Patient with resultant left hemiparesis. Work up underway.   Hyperlipidemia, LDL 159, goal < 100, add statin  HGB A1C   6.0  Hypertension, accelerated  Hospital day # 1  TREATMENT/PLAN  Continue aspirin 325 mg orally every day and POINT STUDY DRUG for secondary stroke prevention.  POINT STUDY  Statin added  Echo pending  Risk factor modification  CIR recommended  Gwendolyn Lima. Manson Passey, [facility], MBA, MHA [facility] Pager: [phone] 09/29/2013 1:57 PM  I have personally obtained a history, examined the patient, evaluated imaging results, and formulated the assessment and plan of care. I agree with the above. Delia Heady, MD

## 2013-09-29 NOTE — Progress Notes (Signed)
Pt prefers d/c home per Dr. Riley Kill. I  Will follow up Monday if pt still in house. Any inpt rehab admission would have to be approved by [facility]. [phone]

## 2013-09-29 NOTE — Evaluation (Signed)
Occupational Therapy Evaluation Patient Details Name: Brooke Newton MRN: [id number] DOB: [birth year] Today's Date: 09/29/2013 Time: 1545-1610 OT Time Calculation (min): 25 min  OT Assessment / Plan / Recommendation History of present illness pt presents with R Centrum Semiovale and R Lateral Thalmic Infarcts.     Clinical Impression   Pt admitted with above. Presents with left side weakness resulting in decreased independence with ADLs and mobility.  Pt is very motivated to work with therapy.  Will continue to follow acutely in order to address below problem list. Recommending CIR to further progress rehab before pt eventually returns home with family.    OT Assessment  Patient needs continued OT Services    Follow Up Recommendations  CIR    Barriers to Discharge      Equipment Recommendations  3 in 1 bedside comode    Recommendations for Other Services Rehab consult  Frequency  Min 3X/week    Precautions / Restrictions Precautions Precautions: Fall   Pertinent Vitals/Pain See vitals    ADL  Eating/Feeding: Performed;Modified independent Where Assessed - Eating/Feeding: Chair Grooming: Performed;Wash/dry hands;Brushing hair;Minimal assistance (using LUE) Where Assessed - Grooming: Supported standing;Unsupported sitting Upper Body Bathing: Simulated;Moderate assistance Where Assessed - Upper Body Bathing: Unsupported sitting Lower Body Bathing: Simulated;Moderate assistance Where Assessed - Lower Body Bathing: Supported sit to stand Upper Body Dressing: Simulated;Moderate assistance Where Assessed - Upper Body Dressing: Unsupported sitting Lower Body Dressing: Moderate assistance;Simulated Where Assessed - Lower Body Dressing: Supported sit to Pharmacist, hospital: Simulated;Moderate assistance Toilet Transfer Method: Stand pivot Toilet Transfer Equipment:  (bed<>chair) Equipment Used: Gait belt Transfers/Ambulation Related to ADLs: Mod assist to prevent left knee  buckling and to provide support/balance while reaching for chair with RUE. 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  Encouraged pt to continue using LUE for ADL tasks as well as for using tablet and phone.  Pt stood at sink (with chair behind her) and bil UEs supported on countertop while performing weight shifting exercises.  Pt then reached for several objects with LUE while standing at countertop.  Pt requires minimal support at proximally on LUE when reaching for items and performing gross motor tasks.    OT Diagnosis: Generalized weakness;Paresis  OT Problem List: Decreased strength;Decreased activity tolerance;Impaired balance (sitting and/or standing);Decreased knowledge of use of DME or AE;Impaired UE functional use;Impaired sensation OT Treatment Interventions: Self-care/ADL training;Therapeutic exercise;Neuromuscular education;DME and/or AE instruction;Therapeutic activities;Patient/family education;Balance training   OT Goals(Current goals can be found in the care plan section) Acute Rehab OT Goals Patient Stated Goal: Home with grandchild OT Goal Formulation: With patient Time For Goal Achievement: 10/13/13 Potential to Achieve Goals: Good  Visit Information  Last OT Received On: 09/29/13 Assistance Needed: +1 History of Present Illness: pt presents with R Centrum Semiovale and R Lateral Thalmic Infarcts.         Prior Functioning     Home Living Family/patient expects to be discharged to:: Private residence Living Arrangements: Spouse/significant other;Children Available Help at Discharge: Family;Available 24 hours/day Type of Home: House Home Access: Stairs to enter [employer] of Steps: 3 Entrance Stairs-Rails: None Home Layout: Two level Alternate Level Stairs-Number of Steps: 6 Alternate Level Stairs-Rails: Right Home Equipment: None Prior Function Level of Independence:  Independent Comments: Pt works as a Producer, television/film/video. Communication Communication: No difficulties Dominant Hand: Right         Vision/Perception Vision - History Baseline  Vision: No visual deficits Patient Visual Report: No change from baseline   Cognition  Cognition Arousal/Alertness: Awake/alert Behavior During Therapy: WFL for tasks assessed/performed Overall Cognitive Status: Within Functional Limits for tasks assessed    Extremity/Trunk Assessment Upper Extremity Assessment Upper Extremity Assessment: LUE deficits/detail LUE Deficits / Details: 3/5 in hand, wrist, elbow. 2+/5 in shoulder. LUE Sensation: decreased light touch;decreased proprioception LUE Coordination: decreased fine motor;decreased gross motor     Mobility Bed Mobility Bed Mobility: Supine to Sit;Sitting - Scoot to Edge of Bed Supine to Sit: 5: Supervision;With rails Sitting - Scoot to Edge of Bed: 5: Supervision Details for Bed Mobility Assistance: Incr time and heavy reliance on rail. Transfers Transfers: Sit to Stand;Stand to Sit Sit to Stand: 4: Min assist;From chair/3-in-1;From bed;With upper extremity assist Stand to Sit: 4: Min assist;To chair/3-in-1;With armrests;With upper extremity assist Details for Transfer Assistance: Cues for use of bil UEs. Assist to block Left knee and to prevent buckling.     Exercise     Balance Balance Balance Assessed: Yes Static Sitting Balance Static Sitting - Balance Support: No upper extremity supported;Feet supported Static Sitting - Level of Assistance: 5: Stand by assistance Static Standing Balance Static Standing - Balance Support: Bilateral upper extremity supported Static Standing - Level of Assistance: 4: Min assist Static Standing - Comment/# of Minutes: 2 minutes Dynamic Standing Balance Dynamic Standing - Balance Support: Right upper extremity supported;During functional activity Dynamic Standing - Level of Assistance: 4: Min assist Dynamic  Standing - Balance Activities: Lateral lean/weight shifting;Forward lean/weight shifting;Reaching for objects Dynamic Standing - Comments: 3 minutes   End of Session OT - End of Session Equipment Utilized During Treatment: Gait belt Activity Tolerance: Patient tolerated treatment well Patient left: in chair;with call bell/phone within reach Nurse Communication: Mobility status  GO   09/29/2013 Cipriano Mile OTR/L Pager [phone] Office [phone]   Cipriano Mile 09/29/2013, 4:52 PM

## 2013-09-29 NOTE — Progress Notes (Signed)
  Echocardiogram 2D Echocardiogram has been performed.  Brooke Newton 09/29/2013, 10:05 AM

## 2013-09-29 NOTE — Progress Notes (Signed)
Bilateral carotid artery duplex:  1-39% ICA stenosis.  Vertebral artery flow is antegrade.     

## 2013-09-29 NOTE — Progress Notes (Signed)
Rehab Admissions Coordinator Note:  Patient was screened by Clois Dupes for appropriateness for an Inpatient Acute Rehab Consult.  At this time, we are recommending Inpatient Rehab consult. I will order.  Clois Dupes 09/29/2013, 11:14 AM  I can be reached at [phone].

## 2013-09-29 NOTE — Consult Note (Signed)
Physical Medicine and Rehabilitation Consult Reason for Consult: CVA Referring Physician: [facility]   HPI: Brooke Newton is a 52 y.o. right-handed Caucasian female with history of hypertension. Admitted 09/28/2013 with left-sided weakness. MRI of the brain showed acute right centrum semi-ovale deep white matter and right lateral thalamus acute infarcts within the right MCA territory. MRA of the head without stenosis or occlusion. Carotid Dopplers with no ICA stenosis. Echocardiogram pending. Patient did not receive TPA. Neurology services consulted placed on aspirin for CVA prophylaxis. Patient is on a regular consistency diet. Physical therapy evaluation completed 09/29/2013 with recommendations for physical medicine rehabilitation consult to consider inpatient rehabilitation services.   Review of Systems  Gastrointestinal: Positive for constipation.  Musculoskeletal: Positive for myalgias.  All other systems reviewed and are negative.   Past Medical History  Diagnosis Date  . Hypertension   . Thyroid disease     hypothyroidism  . Shingles At age 64   Past Surgical History  Procedure Laterality Date  . Cesarean section     Family History  Problem Relation Age of Onset  . Hypertension Mother   . Stroke Mother   . Hypertension Brother    Social History:  reports that she has never smoked. She has never used smokeless tobacco. She reports that she does not drink alcohol or use illicit drugs. Allergies: No Known Allergies Medications Prior to Admission  Medication Sig Dispense Refill  . hydrochlorothiazide (HYDRODIURIL) 25 MG tablet TAKE 1 TABLET DAILY  90 tablet  1  . levothyroxine (SYNTHROID, LEVOTHROID) 75 MCG tablet Take 75 mcg by mouth daily before breakfast.        Home: Home Living Family/patient expects to be discharged to:: Private residence Living Arrangements: Spouse/significant other;Children Available Help at Discharge: Family;Available 24 hours/day Type of Home:  House Home Access: Stairs to enter [employer] of Steps: 3 Entrance Stairs-Rails: None Home Layout: Two level (split level) Alternate Level Stairs-Number of Steps: 6 Alternate Level Stairs-Rails: Right Home Equipment: None  Functional History:   Functional Status:  Mobility: Bed Mobility Bed Mobility: Supine to Sit;Sitting - Scoot to Edge of Bed Supine to Sit: 5: Supervision;With rails;HOB elevated Sitting - Scoot to Edge of Bed: 5: Supervision Transfers Transfers: Sit to Stand;Stand to Sit Sit to Stand: 4: Min assist;With upper extremity assist;From bed Stand to Sit: 4: Min assist;With upper extremity assist;To chair/3-in-1 Ambulation/Gait Ambulation/Gait Assistance: 3: Mod assist Ambulation Distance (Feet): 30 Feet Assistive device: 1 person hand held assist Ambulation/Gait Assistance Details: pt L LE buckling and fatigues quickly.  L foot supinates and decreased heel strike.   Gait Pattern: Step-to pattern;Decreased step length - right;Decreased stance time - left;Decreased dorsiflexion - left;Decreased weight shift to left;Ataxic Stairs: No Wheelchair Mobility Wheelchair Mobility: No  ADL:    Cognition: Cognition Overall Cognitive Status: Within Functional Limits for tasks assessed Orientation Level: Oriented X4 Cognition Arousal/Alertness: Awake/alert Behavior During Therapy: WFL for tasks assessed/performed Overall Cognitive Status: Within Functional Limits for tasks assessed  Blood pressure 147/87, pulse 77, temperature 98.4 F (36.9 C), temperature source Oral, resp. rate 18, height 5\' 2"  (1.575 m), weight 79.833 kg (176 lb), SpO2 98.00%. Physical Exam  Constitutional: She is oriented to person, place, and time. She appears well-developed.  HENT:  Head: Normocephalic.  Eyes: EOM are normal.  Neck: Normal range of motion. Neck supple. No thyromegaly present.  Cardiovascular: Normal rate and regular rhythm.   Respiratory: Effort normal and breath  sounds normal. No respiratory distress.  GI: Soft. Bowel sounds are  normal. She exhibits no distension.  Neurological: She is alert and oriented to person, place, and time. She displays normal reflexes. She exhibits normal muscle tone.  Follows commands. Speech is mildly dysarthric but fully intelligible. No focal CN deficits. Left pronator drift and ataxia of the LUE and LLE. Strength 3+ to 4- LUE and 2+ to 3/5 LLE with some apraxia noted. Cognitively has excellent insight and awareness.   Skin: Skin is warm and dry.  Psychiatric: She has a normal mood and affect. Her behavior is normal. Judgment and thought content normal.    Results for orders placed during the hospital encounter of 09/28/13 (from the past 24 hour(s))  TROPONIN I     Status: None   Collection Time    09/28/13  5:43 PM      Result Value Range   Troponin I <0.30  <0.30 ng/mL  GLUCOSE, CAPILLARY     Status: Abnormal   Collection Time    09/28/13  5:45 PM      Result Value Range   Glucose-Capillary 110 (*) 70 - 99 mg/dL  POCT I-STAT TROPONIN I     Status: None   Collection Time    09/28/13  5:46 PM      Result Value Range   Troponin i, poc 0.00  0.00 - 0.08 ng/mL   Comment 3           PROTIME-INR     Status: None   Collection Time    09/28/13  6:02 PM      Result Value Range   Prothrombin Time 13.0  11.6 - 15.2 seconds   INR 1.00  0.00 - 1.49  APTT     Status: None   Collection Time    09/28/13  6:02 PM      Result Value Range   aPTT 26  24 - 37 seconds  CBC     Status: Abnormal   Collection Time    09/28/13  6:02 PM      Result Value Range   WBC 11.6 (*) 4.0 - 10.5 K/uL   RBC 4.41  3.87 - 5.11 MIL/uL   Hemoglobin 12.9  12.0 - 15.0 g/dL   HCT 16.1  09.6 - 04.5 %   MCV 86.6  78.0 - 100.0 fL   MCH 29.3  26.0 - 34.0 pg   MCHC 33.8  30.0 - 36.0 g/dL   RDW 40.9  81.1 - 91.4 %   Platelets 313  150 - 400 K/uL  DIFFERENTIAL     Status: Abnormal   Collection Time    09/28/13  6:02 PM      Result Value Range    Neutrophils Relative % 77  43 - 77 %   Neutro Abs 9.0 (*) 1.7 - 7.7 K/uL   Lymphocytes Relative 17  12 - 46 %   Lymphs Abs 2.0  0.7 - 4.0 K/uL   Monocytes Relative 5  3 - 12 %   Monocytes Absolute 0.6  0.1 - 1.0 K/uL   Eosinophils Relative 1  0 - 5 %   Eosinophils Absolute 0.1  0.0 - 0.7 K/uL   Basophils Relative 0  0 - 1 %   Basophils Absolute 0.0  0.0 - 0.1 K/uL  COMPREHENSIVE METABOLIC PANEL     Status: Abnormal   Collection Time    09/28/13  6:02 PM      Result Value Range   Sodium 135  135 - 145 mEq/L   Potassium  2.7 (*) 3.5 - 5.1 mEq/L   Chloride 95 (*) 96 - 112 mEq/L   CO2 26  19 - 32 mEq/L   Glucose, Bld 118 (*) 70 - 99 mg/dL   BUN 8  6 - 23 mg/dL   Creatinine, Ser 4.09  0.50 - 1.10 mg/dL   Calcium 9.5  8.4 - 81.1 mg/dL   Total Protein 7.7  6.0 - 8.3 g/dL   Albumin 3.8  3.5 - 5.2 g/dL   AST 31  0 - 37 U/L   ALT 35  0 - 35 U/L   Alkaline Phosphatase 88  39 - 117 U/L   Total Bilirubin 0.3  0.3 - 1.2 mg/dL   GFR calc non Af Amer >90  >90 mL/min   GFR calc Af Amer >90  >90 mL/min  HEMOGLOBIN A1C     Status: Abnormal   Collection Time    09/28/13  6:02 PM      Result Value Range   Hemoglobin A1C 6.0 (*) <5.7 %   Mean Plasma Glucose 126 (*) <117 mg/dL  LIPID PANEL     Status: Abnormal   Collection Time    09/29/13  6:45 AM      Result Value Range   Cholesterol 191  0 - 200 mg/dL   Triglycerides 97  <914 mg/dL   HDL 46  >78 mg/dL   Total CHOL/HDL Ratio 4.2     VLDL 19  0 - 40 mg/dL   LDL Cholesterol 295 (*) 0 - 99 mg/dL  MAGNESIUM     Status: None   Collection Time    09/29/13  6:45 AM      Result Value Range   Magnesium 2.0  1.5 - 2.5 mg/dL  BASIC METABOLIC PANEL     Status: Abnormal   Collection Time    09/29/13  6:45 AM      Result Value Range   Sodium 139  135 - 145 mEq/L   Potassium 3.5  3.5 - 5.1 mEq/L   Chloride 102  96 - 112 mEq/L   CO2 24  19 - 32 mEq/L   Glucose, Bld 102 (*) 70 - 99 mg/dL   BUN 6  6 - 23 mg/dL   Creatinine, Ser 6.21  0.50 -  1.10 mg/dL   Calcium 9.7  8.4 - 30.8 mg/dL   GFR calc non Af Amer >90  >90 mL/min   GFR calc Af Amer >90  >90 mL/min   Dg Chest 2 View  09/29/2013   CLINICAL DATA:  Stroke, left-sided weakness  EXAM: CHEST  2 VIEW  COMPARISON:  None.  FINDINGS: Mild central bronchitic changes. Minimal left basilar atelectasis. The lungs are otherwise clear. Cardiac and mediastinal contours are within normal limits. No pneumothorax or pleural effusion. No edema. No acute osseous abnormality.  IMPRESSION: Subsegmental left basilar atelectasis. Central bronchitic changes. Otherwise, no acute cardiopulmonary process.   Electronically Signed   By: Malachy Moan M.D.   On: 09/29/2013 01:29   Ct Head (brain) Wo Contrast  09/28/2013   CLINICAL DATA:  Left-sided weakness.  Code stroke.  EXAM: CT HEAD WITHOUT CONTRAST  TECHNIQUE: Contiguous axial images were obtained from the base of the skull through the vertex without intravenous contrast.  COMPARISON:  None.  FINDINGS: The ventricles are normal in size and configuration. There are no parenchymal masses or mass effect. Minor periventricular white matter hypoattenuation is noted most consistent with chronic microvascular ischemic change.  There is no evidence of  a recent transcortical infarct.  No extra-axial masses or abnormal fluid collections.  There is no intracranial hemorrhage.  The visualized sinuses and mastoid air cells are clear.  IMPRESSION: 1. No acute intracranial abnormalities. No intracranial hemorrhage. 2. Minor chronic microvascular ischemic change. Exam otherwise unremarkable. Critical Value/emergent results were called by telephone at the time of interpretation on 09/28/2013 at 5:47 PM to Dr.Yao, who verbally acknowledged these results.   Electronically Signed   By: Amie Portland M.D.   On: 09/28/2013 17:48   Mr Maxine Glenn Head Wo Contrast  09/28/2013   CLINICAL DATA:  Left-sided weakness and numbness. No speech problems. Stroke risk factors include hypertension.   EXAM: MRI HEAD WITHOUT CONTRAST  MRA HEAD WITHOUT CONTRAST  TECHNIQUE: Multiplanar, multiecho pulse sequences of the brain and surrounding structures were obtained without intravenous contrast. Angiographic images of the head were obtained using MRA technique without contrast.  COMPARISON:  CT head 09/28/2013.  FINDINGS: MRI HEAD FINDINGS  Acute right centrum semiovale deep white matter and right lateral thalamus acute infarction lies within the right MCA lenticulostriate territory. No visible acute hemorrhage.  Normal cerebral volume. Prominent perivascular spaces. No large vessel infarct.  Premature white matter signal abnormality, periventricular greater than subcortical in location, most consistent with chronic microvascular ischemic change in this patient with hypertension. No foci of chronic hemorrhage.  Calvarium, skull base, and upper cervical region unremarkable. Scalp and extracranial soft tissues within normal limits  Negative orbits. Mild paranasal sinus disease in the ethmoids without air-fluid level. No acute mastoid fluid.  Compared with the prior CT, the infarct is not visible.  MRA HEAD FINDINGS  The skull base, petrous, cavernous, and supraclinoid internal carotid arteries are widely patent. The right A1 anterior cerebral artery is diffusely diseased. The left A1 ACA is widely patent.  Both proximal middle cerebral arteries are widely patent without stenosis or irregularity. No MCA branch occlusion.  The basilar artery is widely patent with the left vertebral dominant. Right vertebral ends in PICA.  The right posterior cerebral artery originates from the basilar, but the left originates from the carotid resulting in mild basilar hypoplasia.  Mild irregularity of the distal ACA, MCA, and PCA vessels consistent with intracranial atherosclerotic change.  No intracranial aneurysm. No cerebellar branch occlusion.  IMPRESSION: MRI HEAD IMPRESSION  Acute right centrum semiovale deep white matter and right  lateral thalamic acute infarction without hemorrhage.  Premature white matter T2 signal abnormality, likely small vessel disease.  MRA HEAD IMPRESSION  Mild intracranial atherosclerotic change without visible proximal stenosis; specifically right ICA and right MCA appear normal.   Electronically Signed   By: Davonna Belling M.D.   On: 09/28/2013 20:21   Mr Brain Wo Contrast  09/28/2013   CLINICAL DATA:  Left-sided weakness and numbness. No speech problems. Stroke risk factors include hypertension.  EXAM: MRI HEAD WITHOUT CONTRAST  MRA HEAD WITHOUT CONTRAST  TECHNIQUE: Multiplanar, multiecho pulse sequences of the brain and surrounding structures were obtained without intravenous contrast. Angiographic images of the head were obtained using MRA technique without contrast.  COMPARISON:  CT head 09/28/2013.  FINDINGS: MRI HEAD FINDINGS  Acute right centrum semiovale deep white matter and right lateral thalamus acute infarction lies within the right MCA lenticulostriate territory. No visible acute hemorrhage.  Normal cerebral volume. Prominent perivascular spaces. No large vessel infarct.  Premature white matter signal abnormality, periventricular greater than subcortical in location, most consistent with chronic microvascular ischemic change in this patient with hypertension. No foci of chronic hemorrhage.  Calvarium, skull base, and upper cervical region unremarkable. Scalp and extracranial soft tissues within normal limits  Negative orbits. Mild paranasal sinus disease in the ethmoids without air-fluid level. No acute mastoid fluid.  Compared with the prior CT, the infarct is not visible.  MRA HEAD FINDINGS  The skull base, petrous, cavernous, and supraclinoid internal carotid arteries are widely patent. The right A1 anterior cerebral artery is diffusely diseased. The left A1 ACA is widely patent.  Both proximal middle cerebral arteries are widely patent without stenosis or irregularity. No MCA branch occlusion.  The  basilar artery is widely patent with the left vertebral dominant. Right vertebral ends in PICA.  The right posterior cerebral artery originates from the basilar, but the left originates from the carotid resulting in mild basilar hypoplasia.  Mild irregularity of the distal ACA, MCA, and PCA vessels consistent with intracranial atherosclerotic change.  No intracranial aneurysm. No cerebellar branch occlusion.  IMPRESSION: MRI HEAD IMPRESSION  Acute right centrum semiovale deep white matter and right lateral thalamic acute infarction without hemorrhage.  Premature white matter T2 signal abnormality, likely small vessel disease.  MRA HEAD IMPRESSION  Mild intracranial atherosclerotic change without visible proximal stenosis; specifically right ICA and right MCA appear normal.   Electronically Signed   By: Davonna Belling M.D.   On: 09/28/2013 20:21    Assessment/Plan: Diagnosis: right MCA infarcts 1. Does the need for close, 24 hr/day medical supervision in concert with the patient's rehab needs make it unreasonable for this patient to be served in a less intensive setting? Yes 2. Co-Morbidities requiring supervision/potential complications: htn, hypothyroid 3. Due to bladder management, bowel management, safety, skin/wound care, disease management, medication administration, pain management and patient education, does the patient require 24 hr/day rehab nursing? Yes 4. Does the patient require coordinated care of a physician, rehab nurse, PT (1-2 hrs/day, 5 days/week) and OT (1-2 hrs/day, 5 days/week) to address physical and functional deficits in the context of the above medical diagnosis(es)? Potentially Addressing deficits in the following areas: balance, endurance, locomotion, strength, transferring, bowel/bladder control, bathing, dressing, feeding, grooming, toileting and psychosocial support 5. Can the patient actively participate in an intensive therapy program of at least 3 hrs of therapy per day at  least 5 days per week? Yes 6. The potential for patient to make measurable gains while on inpatient rehab is good 7. Anticipated functional outcomes upon discharge from inpatient rehab are mod I to supervision with PT, mod I to supervision with OT, n./a with SLP. 8. Estimated rehab length of stay to reach the above functional goals is: 7 days 9. Does the patient have adequate social supports to accommodate these discharge functional goals? Yes 10. Anticipated D/C setting: Home 11. Anticipated post D/C treatments: HH therapy 12. Overall Rehab/Functional Prognosis: excellent  RECOMMENDATIONS: This patient's condition is appropriate for continued rehabilitative care in the following setting: brief CIR (vs home with outpt or HH therapies) Patient has agreed to participate in recommended program. No and Potentially Note that insurance prior authorization may be required for reimbursement for recommended care.  Comment: The patient prefers to go home. If the family is available and she shows further improvement over the weekend, this is potentially possible. (Although, if she goes home I would recommend outpt therapies.)---We will follow up Monday.   Ranelle Oyster, MD, [facility] Physical Medicine & Rehabilitation     09/29/2013

## 2013-09-30 MED ORDER — ASPIRIN EC 325 MG PO TBEC: 325.0000 mg | DELAYED_RELEASE_TABLET | Freq: Every day | ORAL | Status: DC

## 2013-09-30 MED ORDER — STUDY - INVESTIGATIONAL DRUG SIMPLE RECORD: 75.0000 mg | Freq: Every day | Status: DC

## 2013-09-30 MED ORDER — SIMVASTATIN 40 MG PO TABS: 40.0000 mg | ORAL_TABLET | Freq: Every day | ORAL | Status: DC

## 2013-09-30 MED ORDER — LEVOTHYROXINE SODIUM 75 MCG PO TABS: 75.0000 ug | ORAL_TABLET | Freq: Every day | ORAL | Status: DC

## 2013-09-30 NOTE — Progress Notes (Signed)
Pt prefers to go home rather than CIR, PT re-evaluated the patient and recommend a cane to take home, also a HH PT & OT. Gave all d/c instructions, prescriptions and the [facility] medicine for 30 days (got it from [facility] 2nd [facility]). No concerns. Pt is waiting for equipments to take home.

## 2013-09-30 NOTE — Progress Notes (Signed)
   CARE MANAGEMENT NOTE 09/30/2013  Patient:  Brooke Newton, Brooke Newton   Account Number:  [id number]  Date Initiated:  09/30/2013  Documentation initiated by:  [facility]  Subjective/Objective Assessment:   adm: Left-sided weakness     Action/Plan:   dishcartge planning   Anticipated DC Date:  09/30/2013   Anticipated DC Plan:  HOME W HOME HEALTH SERVICES      DC Planning Services  CM consult      [facility] Choice  HOME HEALTH   Choice offered to / List presented to:  C-1 Patient   DME arranged  CANE      DME agency  [facility]     HH arranged  HH-2 PT  HH-3 OT      [facility] agency  [facility]   Status of service:  Completed, signed off Medicare Important Message given?   (If response is "NO", the following Medicare IM given date fields will be blank) Date Medicare IM given:   Date Additional Medicare IM given:    Discharge Disposition:  HOME W HOME HEALTH SERVICES  Per UR Regulation:    If discussed at Long Length of Stay Meetings, dates discussed:    Comments:  09/30/13 10:11 CM spoke with pt in room for choice and pt chose [facility] for her HHPT/OT needs.  Address and contact numbers were verified.  Cane to be brought to room prior to discharge.  Referral faxed to [facility] for HHPT/OT.  No other CM needs communicated.  Freddy Jaksch, BSN, CM [phone].

## 2013-09-30 NOTE — Progress Notes (Signed)
Physical Therapy Treatment Patient Details Name: Brooke Newton MRN: [id number] DOB: [birth year] Today's Date: 09/30/2013 Time: 4098-1191 PT Time Calculation (min): 24 min  PT Assessment / Plan / Recommendation  History of Present Illness pt presents with R Centrum Semiovale and R Lateral Thalmic Infarcts.     PT Comments   Pt refusing to go to inpatient rehab therefore updated d/c plans to HHPT/OT.  Pt able to perform stair negotiation with family/caregivers. Educated pt on the importance of having assistance initially with ambulation and transfers due to high fall risk.   Follow Up Recommendations  Home health PT;Supervision/Assistance - 24 hour     Equipment Recommendations  Cane    Frequency Min 4X/week   Progress towards PT Goals Progress towards PT goals: Progressing toward goals  Plan Discharge plan needs to be updated    Precautions / Restrictions Precautions Precautions: Fall Restrictions Weight Bearing Restrictions: No   Pertinent Vitals/Pain No c/o pain    Mobility  Bed Mobility Bed Mobility: Supine to Sit;Sitting - Scoot to Edge of Bed Supine to Sit: 5: Supervision;With rails Sitting - Scoot to Edge of Bed: 5: Supervision Details for Bed Mobility Assistance: Incr time and heavy reliance on rail. Transfers Transfers: Sit to Stand;Stand to Sit Sit to Stand: 4: Min assist;From chair/3-in-1;From bed;With upper extremity assist Stand to Sit: 4: Min assist;To chair/3-in-1;With armrests;With upper extremity assist Details for Transfer Assistance: Cues for use of bil UEs. Assist to block Left knee and to prevent buckling. Ambulation/Gait Ambulation/Gait Assistance: 4: Min guard;4: Min Environmental consultant (Feet): 100 Feet (50' x 2) Assistive device: Straight cane Ambulation/Gait Assistance Details: Cues for [facility] placement and proper step sequence. Educated family on proper guarding technique during ambulation.  Gait Pattern: Step-to pattern;Decreased step  length - right;Decreased stance time - left;Decreased dorsiflexion - left;Decreased weight shift to left;Ataxic Gait velocity: decreased Stairs: Yes Stairs Assistance: 4: Min assist Stairs Assistance Details (indicate cue type and reason): (A) to maintain balance with max cues for step sequence and SPC placement Stair Management Technique: One rail Right;Forwards Number of Stairs: 6 (3 steps x 2) Wheelchair Mobility Wheelchair Mobility: No Modified Rankin (Stroke Patients Only) Pre-Morbid Rankin Score: No symptoms Modified Rankin: Moderately severe disability    Exercises     PT Diagnosis:    PT Problem List:   PT Treatment Interventions:     PT Goals (current goals can now be found in the care plan section) Acute Rehab PT Goals Patient Stated Goal: Home with grandchild PT Goal Formulation: With patient Time For Goal Achievement: 10/13/13 Potential to Achieve Goals: Good  Visit Information  Last PT Received On: 09/30/13 Assistance Needed: +1 History of Present Illness: pt presents with R Centrum Semiovale and R Lateral Thalmic Infarcts.      Subjective Data  Subjective: "I'm going home today." Patient Stated Goal: Home with grandchild   Cognition  Cognition Arousal/Alertness: Awake/alert Behavior During Therapy: WFL for tasks assessed/performed Overall Cognitive Status: Within Functional Limits for tasks assessed    Balance  Balance Balance Assessed: Yes Static Sitting Balance Static Sitting - Balance Support: No upper extremity supported;Feet supported Static Sitting - Level of Assistance: 5: Stand by assistance Static Standing Balance Static Standing - Balance Support: Bilateral upper extremity supported Static Standing - Level of Assistance: 4: Min assist Dynamic Standing Balance Dynamic Standing - Balance Support: Right upper extremity supported;During functional activity Dynamic Standing - Level of Assistance: 4: Min assist Dynamic Standing - Balance  Activities: Lateral lean/weight shifting;Forward lean/weight shifting;Reaching  for objects  End of Session PT - End of Session Equipment Utilized During Treatment: Gait belt Activity Tolerance: Patient tolerated treatment well Patient left: in chair;with call bell/phone within reach;with family/visitor present Nurse Communication: Mobility status   GP     Aliese Brannum 09/30/2013, 1:00 PM  Jake Shark, PT DPT [phone]

## 2013-09-30 NOTE — Progress Notes (Signed)
Stroke Team Progress Note  HISTORY Brooke Newton is a 52 y.o. female, right handed, with a past medical history significant for HTN, hypothyroidism, shingles, brought to [facility] ED as a code stroke, 09/28/2013, due to acute onset left arm-leg paresthesias and weakness.  She never had similar symptoms before.   She was last known well at 4:05 pm 09/28/2013 when she was at work and developed sudden onset of left arm tingling-numbness that rapidly traveled to the left leg. She said that the left arm and leg were " heavy and weak but was still able to use my hand".  No HA, vertigo, double vision, difficulty swallowing, slurred speech, confusion, imbalance, language or vision impairment.  NIHSS 1.   CT brain unremarkable.   Date last known well: 09/28/13  Time last known well: 405 pm  tPA Given: no, NIHSS 1  NIHSS: 1  MRS: 0  Patient was not a TPA candidate secondary to rapidly improving symptoms. She was admitted for further evaluation and treatment.  SUBJECTIVE The patient is without complaints. Her family is here and she is rated for discharge.  OBJECTIVE Most recent Vital Signs: Filed Vitals:   09/29/13 1812 09/29/13 2149 09/30/13 0120 09/30/13 0600  BP: 117/70 120/80 133/69 107/68  Pulse: 72 66 80 63  Temp: 97.8 F (36.6 C) 98.3 F (36.8 C) 98.5 F (36.9 C) 98 F (36.7 C)  TempSrc: Oral Oral Oral Oral  Resp: 18 18 18 18   Height:      Weight:      SpO2: 98% 97% 98% 97%   CBG (last 3)   Recent Labs  09/28/13 1745  GLUCAP 110*    IV Fluid Intake:     MEDICATIONS  . aspirin  300 mg Rectal Daily   Or  . aspirin  325 mg Oral Daily  . levothyroxine  75 mcg Oral QAC breakfast  . POINT study - Placebo / clopidogrel (daily dose)  (PI-Sethi)  75 mg Oral Q breakfast  . simvastatin  40 mg Oral q1800   PRN:  acetaminophen, acetaminophen, senna-docusate  Diet:  Cardiac thin liquids Activity: out of bed with assist DVT Prophylaxis:  SCD  CLINICALLY SIGNIFICANT  STUDIES Basic Metabolic Panel:   Recent Labs Lab 09/28/13 1802 09/29/13 0645  NA 135 139  K 2.7* 3.5  CL 95* 102  CO2 26 24  GLUCOSE 118* 102*  BUN 8 6  CREATININE 0.71 0.70  CALCIUM 9.5 9.7  MG  --  2.0   Liver Function Tests:   Recent Labs Lab 09/28/13 1802  AST 31  ALT 35  ALKPHOS 88  BILITOT 0.3  PROT 7.7  ALBUMIN 3.8   CBC:   Recent Labs Lab 09/28/13 1802  WBC 11.6*  NEUTROABS 9.0*  HGB 12.9  HCT 38.2  MCV 86.6  PLT 313   Coagulation:   Recent Labs Lab 09/28/13 1802  LABPROT 13.0  INR 1.00   Cardiac Enzymes:   Recent Labs Lab 09/28/13 1743  TROPONINI <0.30   Urinalysis: No results found for this basename: COLORURINE, APPERANCEUR, LABSPEC, PHURINE, GLUCOSEU, HGBUR, BILIRUBINUR, KETONESUR, PROTEINUR, UROBILINOGEN, NITRITE, LEUKOCYTESUR,  in the last 168 hours Lipid Panel    Component Value Date/Time   CHOL 191 09/29/2013 0645   TRIG 97 09/29/2013 0645   TRIG 108 04/11/2013 1205   HDL 46 09/29/2013 0645   CHOLHDL 4.2 09/29/2013 0645   VLDL 19 09/29/2013 0645   LDLCALC 126* 09/29/2013 0645   LDLCALC 159* 04/11/2013 1205   HgbA1C  Lab Results  Component Value Date   HGBA1C 6.0* 09/28/2013    Urine Drug Screen:   No results found for this basename: labopia,  cocainscrnur,  labbenz,  amphetmu,  thcu,  labbarb    Alcohol Level: No results found for this basename: ETH,  in the last 168 hours  Dg Chest 2 View 09/29/2013    Subsegmental left basilar atelectasis. Central bronchitic changes. Otherwise, no acute cardiopulmonary process.      Ct Head (brain) Wo Contrast 09/28/2013    1. No acute intracranial abnormalities. No intracranial hemorrhage. 2. Minor chronic microvascular ischemic change. Exam otherwise unremarkable.  Mra Head Wo Contrast 09/28/2013    Mild intracranial atherosclerotic change without visible proximal stenosis; specifically right ICA and right MCA appear normal.     Mr Brain Wo Contrast 09/28/2013    Acute right  centrum semiovale deep white matter and right lateral thalamic acute infarction without hemorrhage.  Premature white matter T2 signal abnormality, likely small vessel disease.   2D Echocardiogram  - ejection fraction 55-60%. No cardiac source of emboli identified.  Carotid Doppler  Bilateral carotid artery duplex: 1-39% ICA stenosis. Vertebral artery flow is antegrade.   EKG  normal sinus rhythm.   Therapy Recommendations CIR  Physical Exam   Mental Status:  Alert, oriented, thought content appropriate. Speech fluent without evidence of aphasia. Able to follow 3 step commands without difficulty.  Cranial Nerves:  II: Discs flat bilaterally; Visual fields grossly normal, pupils equal, round, reactive to light and accommodation  III,IV, VI: ptosis not present, extra-ocular motions intact bilaterally  V,VII: smile symmetric, facial light touch sensation normal bilaterally  VIII: hearing normal bilaterally  IX,X: gag reflex present  XI: bilateral shoulder shrug  XII: midline tongue extension  Motor:  Right : Upper extremity 5/5 Left: Upper extremity 5/5  Lower extremity 5/5 Lower extremity 5/5  Tone and bulk:normal tone throughout; no atrophy noted  Sensory: Pinprick and light touch intact throughout, bilaterally  Deep Tendon Reflexes:  Right: Upper Extremity Left: Upper extremity  biceps (C-5 to C-6) 2/4 biceps (C-5 to C-6) 2/4  tricep (C7) 2/4 triceps (C7) 2/4  Brachioradialis (C6) 2/4 Brachioradialis (C6) 2/4  Lower Extremity Lower Extremity  quadriceps (L-2 to L-4) 2/4 quadriceps (L-2 to L-4) 2/4  Achilles (S1) 2/4 Achilles (S1) 2/4  Plantars:  Right: downgoing Left: downgoing  Cerebellar:  normal finger-to-nose and normal heel-to-shin test in the right. Ataxic left upper limb.  Gait:  No ataxia.  CV: pulses palpable throughout   NIHSS 1  ASSESSMENT Ms. Brooke Newton is a 52 y.o. female presenting with left hemiparesis/hemi-sensory loss. Imaging confirms a right  thalamic infarct. Infarct felt to be embolic secondary to unknown source.  On no antithrombotics prior to admission. Now on aspirin 325 mg orally every day and POINT study for secondary stroke prevention. Patient with resultant left hemiparesis. Work up underway.   Hyperlipidemia, LDL 159, goal < 100, add statin  HGB A1C   6.0  Hypertension, accelerated  Hospital day # 2  TREATMENT/PLAN  Continue aspirin 325 mg orally every day and POINT STUDY DRUG for secondary stroke prevention.  POINT STUDY medicine one tablet daily x 3 months and f/u in office in 3 months  Statin added  Risk factor modification  CIR recommended - patient prefers discharge to home as opposed to inpatient rehabilitation.  Hypertension now controlled.  D/W patient and husband  Hassel Neth [facility] Pager [phone] 09/30/2013, 8:06 AM  I have  personally obtained a history, examined the patient, evaluated imaging results, and formulated the assessment and plan of care. I agree with the above.  Antony Contras, MD

## 2013-09-30 NOTE — Discharge Summary (Signed)
Physician Discharge Summary  Brooke Newton ZOX:[id number] DOB: 09/13/1961 DOA: 09/28/2013  PCP: Rudi Heap, MD  Admit date: 09/28/2013 Discharge date: 09/30/2013  Time spent: >35 minutes  Recommendations for Outpatient Follow-up:  F/u with neurologist in 4 weeks  F/u with PCP in 1-2 weeks Discharge Diagnoses:  Principal Problem:   Left-sided weakness Active Problems:   Acute CVA (cerebrovascular accident)   Essential hypertension, benign   Unspecified hypothyroidism   Hypokalemia   Discharge Condition: stable   Diet recommendation: heart healthy   Filed Weights   09/28/13 2300  Weight: 79.833 kg (176 lb)    History of present illness:  52 y.o. Caucasian female with history of hypertension, obesity, and hypothyroidism who presents with the L sided weakness found to have acute CVA   Hospital Course:  1. Acute CVA; MRI: Acute right centrum semiovale deep white matter and right lateral thalamic acute infarction without hemorrhage  -improving; started ASA+point study; statin; echo: LVEF 60%, carotids: no significant obstruction; HA1C-6.0; tele no arrythmia  -PT/OT recommended CIR, but patient refusing wants to go home;  2. Hypo K likely diuretic related; replaced K; hold diuretic  3. Hypo thyroidism, TSH: 5.9; cont synthroid  4. HTN hold hctz; BP stable off meds; reevaluate with PCP in 1 week     Procedures:  MRI (i.e. Studies not automatically included, echos, thoracentesis, etc; not x-rays)  Consultations:  Neurology   Discharge Exam: Filed Vitals:   09/30/13 0930  BP: 141/72  Pulse: 68  Temp: 98.3 F (36.8 C)  Resp: 18    General: alert Cardiovascular: s1,s2 rrr Respiratory: CTA bl   Discharge Instructions  Discharge Orders   Future Appointments Provider Department Dept Phone   10/04/2013 8:00 AM [facility] [phone]   10/04/2013 8:30 AM [facility] Pharmacist [facility]  [phone]   10/10/2013 4:15 PM Mary-Margaret Daphine Deutscher, FNP [facility] [phone]   Future Orders Complete By Expires   Diet - low sodium heart healthy  As directed    Discharge instructions  As directed    Comments:     Please follow up with Dr. Pearlean Brownie neurologist  in 4 weeks   Increase activity slowly  As directed        Medication List    STOP taking these medications       hydrochlorothiazide 25 MG tablet  Commonly known as:  HYDRODIURIL      TAKE these medications       aspirin EC 325 MG tablet  Take 1 tablet (325 mg total) by mouth daily.     INVESTIGATIONAL DRUG SIMPLE RECORD  Take 75 mg by mouth daily with breakfast.     levothyroxine 75 MCG tablet  Commonly known as:  SYNTHROID, LEVOTHROID  Take 1 tablet (75 mcg total) by mouth daily before breakfast.     simvastatin 40 MG tablet  Commonly known as:  ZOCOR  Take 1 tablet (40 mg total) by mouth daily at 6 PM.       No Known Allergies     Follow-up Information   Follow up with Rudi Heap, MD. Schedule an appointment as soon as possible for a visit in 1 week.   Specialty:  Family Medicine   Contact information:   [address] [place] [address] [phone]       Follow up with Gates Rigg, MD In 1 month.   Specialties:  Neurology, Radiology   Contact information:   [phone] [facility]  [address] [place] [address] [phone]        The results of significant diagnostics from this hospitalization (including imaging, microbiology, ancillary and laboratory) are listed below for reference.    Significant Diagnostic Studies: Dg Chest 2 View  09/29/2013   CLINICAL DATA:  Stroke, left-sided weakness  EXAM: CHEST  2 VIEW  COMPARISON:  None.  FINDINGS: Mild central bronchitic changes. Minimal left basilar atelectasis. The lungs are otherwise clear. Cardiac and mediastinal contours are within normal limits. No pneumothorax or pleural effusion. No edema. No acute  osseous abnormality.  IMPRESSION: Subsegmental left basilar atelectasis. Central bronchitic changes. Otherwise, no acute cardiopulmonary process.   Electronically Signed   By: Malachy Moan M.D.   On: 09/29/2013 01:29   Ct Head (brain) Wo Contrast  09/28/2013   CLINICAL DATA:  Left-sided weakness.  Code stroke.  EXAM: CT HEAD WITHOUT CONTRAST  TECHNIQUE: Contiguous axial images were obtained from the base of the skull through the vertex without intravenous contrast.  COMPARISON:  None.  FINDINGS: The ventricles are normal in size and configuration. There are no parenchymal masses or mass effect. Minor periventricular white matter hypoattenuation is noted most consistent with chronic microvascular ischemic change.  There is no evidence of a recent transcortical infarct.  No extra-axial masses or abnormal fluid collections.  There is no intracranial hemorrhage.  The visualized sinuses and mastoid air cells are clear.  IMPRESSION: 1. No acute intracranial abnormalities. No intracranial hemorrhage. 2. Minor chronic microvascular ischemic change. Exam otherwise unremarkable. Critical Value/emergent results were called by telephone at the time of interpretation on 09/28/2013 at 5:47 PM to Dr.Yao, who verbally acknowledged these results.   Electronically Signed   By: Amie Portland M.D.   On: 09/28/2013 17:48   Mr Maxine Glenn Head Wo Contrast  09/28/2013   CLINICAL DATA:  Left-sided weakness and numbness. No speech problems. Stroke risk factors include hypertension.  EXAM: MRI HEAD WITHOUT CONTRAST  MRA HEAD WITHOUT CONTRAST  TECHNIQUE: Multiplanar, multiecho pulse sequences of the brain and surrounding structures were obtained without intravenous contrast. Angiographic images of the head were obtained using MRA technique without contrast.  COMPARISON:  CT head 09/28/2013.  FINDINGS: MRI HEAD FINDINGS  Acute right centrum semiovale deep white matter and right lateral thalamus acute infarction lies within the right MCA  lenticulostriate territory. No visible acute hemorrhage.  Normal cerebral volume. Prominent perivascular spaces. No large vessel infarct.  Premature white matter signal abnormality, periventricular greater than subcortical in location, most consistent with chronic microvascular ischemic change in this patient with hypertension. No foci of chronic hemorrhage.  Calvarium, skull base, and upper cervical region unremarkable. Scalp and extracranial soft tissues within normal limits  Negative orbits. Mild paranasal sinus disease in the ethmoids without air-fluid level. No acute mastoid fluid.  Compared with the prior CT, the infarct is not visible.  MRA HEAD FINDINGS  The skull base, petrous, cavernous, and supraclinoid internal carotid arteries are widely patent. The right A1 anterior cerebral artery is diffusely diseased. The left A1 ACA is widely patent.  Both proximal middle cerebral arteries are widely patent without stenosis or irregularity. No MCA branch occlusion.  The basilar artery is widely patent with the left vertebral dominant. Right vertebral ends in PICA.  The right posterior cerebral artery originates from the basilar, but the left originates from the carotid resulting in mild basilar hypoplasia.  Mild irregularity of the distal ACA, MCA, and PCA vessels consistent with intracranial atherosclerotic change.  No intracranial aneurysm. No  cerebellar branch occlusion.  IMPRESSION: MRI HEAD IMPRESSION  Acute right centrum semiovale deep white matter and right lateral thalamic acute infarction without hemorrhage.  Premature white matter T2 signal abnormality, likely small vessel disease.  MRA HEAD IMPRESSION  Mild intracranial atherosclerotic change without visible proximal stenosis; specifically right ICA and right MCA appear normal.   Electronically Signed   By: Davonna Belling M.D.   On: 09/28/2013 20:21   Mr Brain Wo Contrast  09/28/2013   CLINICAL DATA:  Left-sided weakness and numbness. No speech  problems. Stroke risk factors include hypertension.  EXAM: MRI HEAD WITHOUT CONTRAST  MRA HEAD WITHOUT CONTRAST  TECHNIQUE: Multiplanar, multiecho pulse sequences of the brain and surrounding structures were obtained without intravenous contrast. Angiographic images of the head were obtained using MRA technique without contrast.  COMPARISON:  CT head 09/28/2013.  FINDINGS: MRI HEAD FINDINGS  Acute right centrum semiovale deep white matter and right lateral thalamus acute infarction lies within the right MCA lenticulostriate territory. No visible acute hemorrhage.  Normal cerebral volume. Prominent perivascular spaces. No large vessel infarct.  Premature white matter signal abnormality, periventricular greater than subcortical in location, most consistent with chronic microvascular ischemic change in this patient with hypertension. No foci of chronic hemorrhage.  Calvarium, skull base, and upper cervical region unremarkable. Scalp and extracranial soft tissues within normal limits  Negative orbits. Mild paranasal sinus disease in the ethmoids without air-fluid level. No acute mastoid fluid.  Compared with the prior CT, the infarct is not visible.  MRA HEAD FINDINGS  The skull base, petrous, cavernous, and supraclinoid internal carotid arteries are widely patent. The right A1 anterior cerebral artery is diffusely diseased. The left A1 ACA is widely patent.  Both proximal middle cerebral arteries are widely patent without stenosis or irregularity. No MCA branch occlusion.  The basilar artery is widely patent with the left vertebral dominant. Right vertebral ends in PICA.  The right posterior cerebral artery originates from the basilar, but the left originates from the carotid resulting in mild basilar hypoplasia.  Mild irregularity of the distal ACA, MCA, and PCA vessels consistent with intracranial atherosclerotic change.  No intracranial aneurysm. No cerebellar branch occlusion.  IMPRESSION: MRI HEAD IMPRESSION   Acute right centrum semiovale deep white matter and right lateral thalamic acute infarction without hemorrhage.  Premature white matter T2 signal abnormality, likely small vessel disease.  MRA HEAD IMPRESSION  Mild intracranial atherosclerotic change without visible proximal stenosis; specifically right ICA and right MCA appear normal.   Electronically Signed   By: Davonna Belling M.D.   On: 09/28/2013 20:21    Microbiology: No results found for this or any previous visit (from the past 240 hour(s)).   Labs: Basic Metabolic Panel:  Recent Labs Lab 09/28/13 1802 09/29/13 0645  NA 135 139  K 2.7* 3.5  CL 95* 102  CO2 26 24  GLUCOSE 118* 102*  BUN 8 6  CREATININE 0.71 0.70  CALCIUM 9.5 9.7  MG  --  2.0   Liver Function Tests:  Recent Labs Lab 09/28/13 1802  AST 31  ALT 35  ALKPHOS 88  BILITOT 0.3  PROT 7.7  ALBUMIN 3.8   No results found for this basename: LIPASE, AMYLASE,  in the last 168 hours No results found for this basename: AMMONIA,  in the last 168 hours CBC:  Recent Labs Lab 09/28/13 1802  WBC 11.6*  NEUTROABS 9.0*  HGB 12.9  HCT 38.2  MCV 86.6  PLT 313   Cardiac Enzymes:  Recent Labs Lab 09/28/13 1743  TROPONINI <0.30   BNP: BNP (last 3 results) No results found for this basename: PROBNP,  in the last 8760 hours CBG:  Recent Labs Lab 09/28/13 1745  GLUCAP 110*       Signed:  Oscar Hank N  [facility] 09/30/2013, 9:45 AM

## 2013-09-30 NOTE — Progress Notes (Signed)
[facility] PROGRESS NOTE  Brooke Newton ZOX:[id number] DOB: June 22, 1961 DOA: 09/28/2013 PCP: Rudi Heap, MD  Assessment/Plan: 52 y.o. Caucasian female with history of hypertension, obesity, and hypothyroidism who presents with the L sided weakness found to have acute CVA   1. Acute CVA; MRI: Acute right centrum semiovale deep white matter and right lateral  thalamic acute infarction without hemorrhage -improving; started ASA+point study; statin; echo: LVEF 60%, carotids: no significant obstruction; HA1C-6.0; tele no arrythmia  -PT/OT recommended CIR, but patient refusing wants to go home;   2. Hypo K likely diuretic related; replaced K; hold diuretic   3. Hypo thyroidism, TSH: 5.9; cont synthroid   4. HTN hold hctz; permissive htn with CVA   Code Status: full  Family Communication: husband at the bedside  (indicate person spoken with, relationship, and if by phone, the number) Disposition Plan: recommended rehab, but patient wants to go home    Consultants:  Neurology   Procedures:  MRI   Antibiotics:  None   (indicate start date, and stop date if known)  HPI/Subjective: alert  Objective: Filed Vitals:   09/30/13 0600  BP: 107/68  Pulse: 63  Temp: 98 F (36.7 C)  Resp: 18    Intake/Output Summary (Last 24 hours) at 09/30/13 0800 Last data filed at 09/29/13 1300  Gross per 24 hour  Intake      1 ml  Output      0 ml  Net      1 ml   Filed Weights   09/28/13 2300  Weight: 79.833 kg (176 lb)    Exam:   General:  Alert   Cardiovascular: s1,s2 rrr  Respiratory: cta bl   Abdomen: soft, nt nd   Musculoskeletal: no edema    Data Reviewed: Basic Metabolic Panel:  Recent Labs Lab 09/28/13 1802 09/29/13 0645  NA 135 139  K 2.7* 3.5  CL 95* 102  CO2 26 24  GLUCOSE 118* 102*  BUN 8 6  CREATININE 0.71 0.70  CALCIUM 9.5 9.7  MG  --  2.0   Liver Function Tests:  Recent Labs Lab 09/28/13 1802  AST 31  ALT 35  ALKPHOS 88   BILITOT 0.3  PROT 7.7  ALBUMIN 3.8   No results found for this basename: LIPASE, AMYLASE,  in the last 168 hours No results found for this basename: AMMONIA,  in the last 168 hours CBC:  Recent Labs Lab 09/28/13 1802  WBC 11.6*  NEUTROABS 9.0*  HGB 12.9  HCT 38.2  MCV 86.6  PLT 313   Cardiac Enzymes:  Recent Labs Lab 09/28/13 1743  TROPONINI <0.30   BNP (last 3 results) No results found for this basename: PROBNP,  in the last 8760 hours CBG:  Recent Labs Lab 09/28/13 1745  GLUCAP 110*    No results found for this or any previous visit (from the past 240 hour(s)).   Studies: Dg Chest 2 View  09/29/2013   CLINICAL DATA:  Stroke, left-sided weakness  EXAM: CHEST  2 VIEW  COMPARISON:  None.  FINDINGS: Mild central bronchitic changes. Minimal left basilar atelectasis. The lungs are otherwise clear. Cardiac and mediastinal contours are within normal limits. No pneumothorax or pleural effusion. No edema. No acute osseous abnormality.  IMPRESSION: Subsegmental left basilar atelectasis. Central bronchitic changes. Otherwise, no acute cardiopulmonary process.   Electronically Signed   By: Malachy Moan M.D.   On: 09/29/2013 01:29   Ct Head (brain) Wo Contrast  09/28/2013   CLINICAL  DATA:  Left-sided weakness.  Code stroke.  EXAM: CT HEAD WITHOUT CONTRAST  TECHNIQUE: Contiguous axial images were obtained from the base of the skull through the vertex without intravenous contrast.  COMPARISON:  None.  FINDINGS: The ventricles are normal in size and configuration. There are no parenchymal masses or mass effect. Minor periventricular white matter hypoattenuation is noted most consistent with chronic microvascular ischemic change.  There is no evidence of a recent transcortical infarct.  No extra-axial masses or abnormal fluid collections.  There is no intracranial hemorrhage.  The visualized sinuses and mastoid air cells are clear.  IMPRESSION: 1. No acute intracranial  abnormalities. No intracranial hemorrhage. 2. Minor chronic microvascular ischemic change. Exam otherwise unremarkable. Critical Value/emergent results were called by telephone at the time of interpretation on 09/28/2013 at 5:47 PM to Dr.Yao, who verbally acknowledged these results.   Electronically Signed   By: Amie Portland M.D.   On: 09/28/2013 17:48   Mr Maxine Glenn Head Wo Contrast  09/28/2013   CLINICAL DATA:  Left-sided weakness and numbness. No speech problems. Stroke risk factors include hypertension.  EXAM: MRI HEAD WITHOUT CONTRAST  MRA HEAD WITHOUT CONTRAST  TECHNIQUE: Multiplanar, multiecho pulse sequences of the brain and surrounding structures were obtained without intravenous contrast. Angiographic images of the head were obtained using MRA technique without contrast.  COMPARISON:  CT head 09/28/2013.  FINDINGS: MRI HEAD FINDINGS  Acute right centrum semiovale deep white matter and right lateral thalamus acute infarction lies within the right MCA lenticulostriate territory. No visible acute hemorrhage.  Normal cerebral volume. Prominent perivascular spaces. No large vessel infarct.  Premature white matter signal abnormality, periventricular greater than subcortical in location, most consistent with chronic microvascular ischemic change in this patient with hypertension. No foci of chronic hemorrhage.  Calvarium, skull base, and upper cervical region unremarkable. Scalp and extracranial soft tissues within normal limits  Negative orbits. Mild paranasal sinus disease in the ethmoids without air-fluid level. No acute mastoid fluid.  Compared with the prior CT, the infarct is not visible.  MRA HEAD FINDINGS  The skull base, petrous, cavernous, and supraclinoid internal carotid arteries are widely patent. The right A1 anterior cerebral artery is diffusely diseased. The left A1 ACA is widely patent.  Both proximal middle cerebral arteries are widely patent without stenosis or irregularity. No MCA branch  occlusion.  The basilar artery is widely patent with the left vertebral dominant. Right vertebral ends in PICA.  The right posterior cerebral artery originates from the basilar, but the left originates from the carotid resulting in mild basilar hypoplasia.  Mild irregularity of the distal ACA, MCA, and PCA vessels consistent with intracranial atherosclerotic change.  No intracranial aneurysm. No cerebellar branch occlusion.  IMPRESSION: MRI HEAD IMPRESSION  Acute right centrum semiovale deep white matter and right lateral thalamic acute infarction without hemorrhage.  Premature white matter T2 signal abnormality, likely small vessel disease.  MRA HEAD IMPRESSION  Mild intracranial atherosclerotic change without visible proximal stenosis; specifically right ICA and right MCA appear normal.   Electronically Signed   By: Davonna Belling M.D.   On: 09/28/2013 20:21   Mr Brain Wo Contrast  09/28/2013   CLINICAL DATA:  Left-sided weakness and numbness. No speech problems. Stroke risk factors include hypertension.  EXAM: MRI HEAD WITHOUT CONTRAST  MRA HEAD WITHOUT CONTRAST  TECHNIQUE: Multiplanar, multiecho pulse sequences of the brain and surrounding structures were obtained without intravenous contrast. Angiographic images of the head were obtained using MRA technique without contrast.  COMPARISON:  CT head 09/28/2013.  FINDINGS: MRI HEAD FINDINGS  Acute right centrum semiovale deep white matter and right lateral thalamus acute infarction lies within the right MCA lenticulostriate territory. No visible acute hemorrhage.  Normal cerebral volume. Prominent perivascular spaces. No large vessel infarct.  Premature white matter signal abnormality, periventricular greater than subcortical in location, most consistent with chronic microvascular ischemic change in this patient with hypertension. No foci of chronic hemorrhage.  Calvarium, skull base, and upper cervical region unremarkable. Scalp and extracranial soft tissues  within normal limits  Negative orbits. Mild paranasal sinus disease in the ethmoids without air-fluid level. No acute mastoid fluid.  Compared with the prior CT, the infarct is not visible.  MRA HEAD FINDINGS  The skull base, petrous, cavernous, and supraclinoid internal carotid arteries are widely patent. The right A1 anterior cerebral artery is diffusely diseased. The left A1 ACA is widely patent.  Both proximal middle cerebral arteries are widely patent without stenosis or irregularity. No MCA branch occlusion.  The basilar artery is widely patent with the left vertebral dominant. Right vertebral ends in PICA.  The right posterior cerebral artery originates from the basilar, but the left originates from the carotid resulting in mild basilar hypoplasia.  Mild irregularity of the distal ACA, MCA, and PCA vessels consistent with intracranial atherosclerotic change.  No intracranial aneurysm. No cerebellar branch occlusion.  IMPRESSION: MRI HEAD IMPRESSION  Acute right centrum semiovale deep white matter and right lateral thalamic acute infarction without hemorrhage.  Premature white matter T2 signal abnormality, likely small vessel disease.  MRA HEAD IMPRESSION  Mild intracranial atherosclerotic change without visible proximal stenosis; specifically right ICA and right MCA appear normal.   Electronically Signed   By: Davonna Belling M.D.   On: 09/28/2013 20:21    Scheduled Meds: . aspirin  300 mg Rectal Daily   Or  . aspirin  325 mg Oral Daily  . levothyroxine  75 mcg Oral QAC breakfast  . POINT study - Placebo / clopidogrel (daily dose)  (PI-Sethi)  75 mg Oral Q breakfast  . simvastatin  40 mg Oral q1800   Continuous Infusions:    Principal Problem:   Left-sided weakness Active Problems:   Acute CVA (cerebrovascular accident)   Essential hypertension, benign   Unspecified hypothyroidism   Hypokalemia    Time spent: > 35 minutes     Esperanza Sheets  [facility] Pager [phone]. If  7PM-7AM, please contact night-coverage at [email or web address], password [email or web address] 09/30/2013, 8:00 AM  LOS: 2 days

## 2013-10-03 ENCOUNTER — Telehealth: Payer: Self-pay | Admitting: Nurse Practitioner

## 2013-10-04 ENCOUNTER — Other Ambulatory Visit: Payer: Self-pay

## 2013-10-04 ENCOUNTER — Ambulatory Visit: Payer: BC Managed Care – PPO

## 2013-10-05 NOTE — Telephone Encounter (Signed)
Question, how long to fast before lab draw? 8 hours.

## 2013-10-10 ENCOUNTER — Encounter: Payer: Self-pay | Admitting: Nurse Practitioner

## 2013-10-10 ENCOUNTER — Ambulatory Visit (INDEPENDENT_AMBULATORY_CARE_PROVIDER_SITE_OTHER): Payer: BC Managed Care – PPO | Admitting: Nurse Practitioner

## 2013-10-10 VITALS — BP 158/96 | HR 88 | Temp 97.1°F | Ht 62.0 in | Wt 177.0 lb

## 2013-10-10 DIAGNOSIS — I639 Cerebral infarction, unspecified: Secondary | ICD-10-CM

## 2013-10-10 DIAGNOSIS — E785 Hyperlipidemia, unspecified: Secondary | ICD-10-CM | POA: Insufficient documentation

## 2013-10-10 DIAGNOSIS — E039 Hypothyroidism, unspecified: Secondary | ICD-10-CM

## 2013-10-10 DIAGNOSIS — I6350 Cerebral infarction due to unspecified occlusion or stenosis of unspecified cerebral artery: Secondary | ICD-10-CM

## 2013-10-10 DIAGNOSIS — I10 Essential (primary) hypertension: Secondary | ICD-10-CM

## 2013-10-10 NOTE — Progress Notes (Signed)
Subjective:    Patient ID: Brooke Newton, female    DOB: 11-25-1961, 52 y.o.   MRN: [id number]  Hypertension This is a chronic problem. The current episode started more than 1 year ago. The problem is unchanged. The problem is uncontrolled. Pertinent negatives include no blurred vision, chest pain, headaches, neck pain, orthopnea, palpitations, peripheral edema or shortness of breath. There are no associated agents to hypertension. Risk factors for coronary artery disease include dyslipidemia, obesity and post-menopausal state. Past treatments include nothing. The current treatment provides moderate improvement. Compliance problems include diet and exercise.  Hypertensive end-organ damage includes CVA (2 weeks ago) and a thyroid problem.  Hyperlipidemia This is a chronic problem. The current episode started more than 1 year ago. The problem is uncontrolled. Recent lipid tests were reviewed and are high. Exacerbating diseases include hypothyroidism and obesity. She has no history of diabetes. There are no known factors aggravating her hyperlipidemia. Pertinent negatives include no chest pain or shortness of breath. Current antihyperlipidemic treatment includes statins. The current treatment provides moderate improvement of lipids. Compliance problems include adherence to diet and adherence to exercise.  Risk factors for coronary artery disease include hypertension, post-menopausal and family history.  Thyroid Problem Presents for follow-up (hypothyroidism) visit. Patient reports no anxiety, cold intolerance, constipation, depressed mood, diaphoresis, diarrhea, dry skin, heat intolerance, hoarse voice, leg swelling, menstrual problem, palpitations, visual change, weight gain or weight loss. Her past medical history is significant for hyperlipidemia. There is no history of diabetes.  CVA-  Light stroke- No residual effects- doing well- PT at home but is ready to be released   Review of Systems   Constitutional: Negative for weight loss, weight gain and diaphoresis.  HENT: Negative for hoarse voice.   Eyes: Negative for blurred vision.  Respiratory: Negative for shortness of breath.   Cardiovascular: Negative for chest pain, palpitations and orthopnea.  Gastrointestinal: Negative for diarrhea and constipation.  Endocrine: Negative for cold intolerance and heat intolerance.  Genitourinary: Negative for menstrual problem.  Musculoskeletal: Negative for neck pain.  Neurological: Negative for headaches.       Objective:   Physical Exam  Constitutional: She is oriented to person, place, and time. She appears well-developed and well-nourished.  HENT:  Nose: Nose normal.  Mouth/Throat: Oropharynx is clear and moist.  Eyes: EOM are normal.  Neck: Trachea normal, normal range of motion and full passive range of motion without pain. Neck supple. No JVD present. Carotid bruit is not present. No thyromegaly present.  Cardiovascular: Normal rate, regular rhythm, normal heart sounds and intact distal pulses.  Exam reveals no gallop and no friction rub.   No murmur heard. Pulmonary/Chest: Effort normal and breath sounds normal.  Abdominal: Soft. Bowel sounds are normal. She exhibits no distension and no mass. There is no tenderness.  Musculoskeletal: Normal range of motion.  Lymphadenopathy:    She has no cervical adenopathy.  Neurological: She is alert and oriented to person, place, and time. She has normal reflexes.  Skin: Skin is warm and dry.  Psychiatric: She has a normal mood and affect. Her behavior is normal. Judgment and thought content normal.    BP 158/96  Pulse 88  Temp(Src) 97.1 F (36.2 C) (Oral)  Ht 5\' 2"  (1.575 m)  Wt 177 lb (80.287 kg)  BMI 32.37 kg/m2       Assessment & Plan:   1. Hyperlipidemia LDL goal < 100   2. Essential hypertension, benign   3. CVA (cerebral infarction)   4.  Hypothyroidism    Orders Placed This Encounter  Procedures  .  CMP14+EGFR  . NMR, lipoprofile  . Thyroid Panel With TSH   Released to drive Keep diary of blood pressure at home Continue all meds Labs pending Diet and exercise encouraged Health maintenance reviewed Follow up in 3 months  Mary-Margaret Daphine Deutscher, FNP

## 2013-10-10 NOTE — Patient Instructions (Signed)

## 2013-10-12 LAB — CMP14+EGFR
Albumin: 4.2 g/dL (ref 3.5–5.5)
Alkaline Phosphatase: 86 IU/L (ref 39–117)
BUN/Creatinine Ratio: 14 (ref 9–23)
BUN: 10 mg/dL (ref 6–24)
CO2: 26 mmol/L (ref 18–29)
Creatinine, Ser: 0.74 mg/dL (ref 0.57–1.00)
GFR calc Af Amer: 106 mL/min/{1.73_m2} (ref >59)
Globulin, Total: 2.9 g/dL (ref 1.5–4.5)
Glucose: 91 mg/dL (ref 65–99)
Total Protein: 7.1 g/dL (ref 6.0–8.5)

## 2013-10-12 LAB — NMR, LIPOPROFILE
Cholesterol: 129 mg/dL (ref <200)
HDL Cholesterol by NMR: 53 mg/dL (ref >=40)
HDL Particle Number: 38.8 umol/L (ref >=30.5)
LDLC SERPL CALC-MCNC: 61 mg/dL (ref <100)
LP-IR Score: 52 — ABNORMAL HIGH (ref <=45)
Small LDL Particle Number: 953 nmol/L — ABNORMAL HIGH (ref <=527)
Triglycerides by NMR: 76 mg/dL (ref <150)

## 2013-10-12 LAB — THYROID PANEL WITH TSH
Free Thyroxine Index: 3 (ref 1.2–4.9)
T3 Uptake Ratio: 25 % (ref 24–39)

## 2013-11-19 DIAGNOSIS — E039 Hypothyroidism, unspecified: Secondary | ICD-10-CM

## 2013-11-19 DIAGNOSIS — I5022 Chronic systolic (congestive) heart failure: Secondary | ICD-10-CM

## 2013-11-19 DIAGNOSIS — Z5189 Encounter for other specified aftercare: Secondary | ICD-10-CM

## 2013-11-19 DIAGNOSIS — I10 Essential (primary) hypertension: Secondary | ICD-10-CM

## 2013-12-07 ENCOUNTER — Encounter (INDEPENDENT_AMBULATORY_CARE_PROVIDER_SITE_OTHER): Payer: Self-pay

## 2013-12-07 ENCOUNTER — Ambulatory Visit (INDEPENDENT_AMBULATORY_CARE_PROVIDER_SITE_OTHER): Payer: BC Managed Care – PPO | Admitting: Neurology

## 2013-12-07 ENCOUNTER — Encounter: Payer: Self-pay | Admitting: Neurology

## 2013-12-07 VITALS — BP 178/96 | HR 81 | Ht 62.0 in | Wt 168.0 lb

## 2013-12-07 DIAGNOSIS — I6350 Cerebral infarction due to unspecified occlusion or stenosis of unspecified cerebral artery: Secondary | ICD-10-CM

## 2013-12-07 NOTE — Progress Notes (Signed)
[facility] [address] [place]. [place] [address] [phone]       OFFICE FOLLOW-UP NOTE  Ms. Brooke Newton Date of Birth:  10/22/1961 Medical Record Number:  [id number]   HPI: 10 year caucasian lady seen for first office followup visit after hospital admission for stroke on 09/28/13. She presented with sudden onset of left-sided weakness and beyond time in the fall intervention. CT of the head was unremarkable but MRI scan showed a right corona radiata and thalamic lacunar infarct. MRA of the brain showed mild intracranial atherosclerotic changes without large vessel stenosis. Carotid ultrasound showed no significant extracranial stenosis. Lipid profile was significant for total cholesterol 191, triglycerides 97, LDL 126 mg percent. Hemoglobin A1c was 6.0. Transthoracic echo showed normal ejection fraction. Patient had mild left-sided weakness which improved during the hospitalization. She was discharged home on aspirin and POINT study medication( placebo versus plavix) after she decided to participate in the  Study for stroke prevention. She was also started on Pravachol for elevated lipids. She states she done well since discharge she finished home therapy. She's made full recovery. She states her blood pressure medication was discontinued after cultures found to be in the 120s and 130s by primary physician. Brooke Newton her blood pressure is elevated in office today at 178/96. She complains of muscle aches and pains in her calves and legs from the protocol. She is tolerating aspirin and the POINT study medication without significant bleeding, bruising. 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  ROS:   14 system review of systems is positive for calf pain, leg pain anxiety. And all other systems negative  PMH:  Past Medical History  Diagnosis Date  . Hypertension   . Thyroid disease     hypothyroidism  . Shingles At age 48    . Stroke     Social History:  History   Social History  . Marital Status: Married    Spouse Name: Brooke Newton    Number of Children: 2  . Years of Education: 12   Occupational History  . self employed    Social History Main Topics  . Smoking status: Never Smoker   . Smokeless tobacco: Never Used  . Alcohol Use: No  . Drug Use: No  . Sexual Activity: Yes   Other Topics Concern  . Not on file   Social History Narrative  . No narrative on file    Medications:   Current Outpatient Prescriptions on File Prior to Visit  Medication Sig Dispense Refill  . aspirin EC 325 MG tablet Take 1 tablet (325 mg total) by mouth daily.  30 tablet  0  . INVESTIGATIONAL DRUG SIMPLE RECORD Take 75 mg by mouth daily with breakfast.  30 mg  1  . levothyroxine (SYNTHROID, LEVOTHROID) 75 MCG tablet Take 1 tablet (75 mcg total) by mouth daily before breakfast.  30 tablet  0  . simvastatin (ZOCOR) 40 MG tablet Take 1 tablet (40 mg total) by mouth daily at 6 PM.  30 tablet  1   No current facility-administered medications on file prior to visit.    Allergies:  No Known Allergies  Physical Exam General: well developed, well nourished, seated, in no evident distress Head: head normocephalic and atraumatic. Orohparynx benign Neck: supple with no carotid or supraclavicular bruits Cardiovascular: regular rate and rhythm, no murmurs Musculoskeletal: no deformity Skin:  no rash/petichiae Vascular:  Normal pulses all extremities Filed Vitals:   12/07/13  1458  BP: 178/96  Pulse: 81    Neurologic Exam Mental Status: Awake and fully alert. Oriented to place and time. Recent and remote memory intact. Attention span, concentration and fund of knowledge appropriate. Mood and affect appropriate.  Cranial Nerves: Fundoscopic exam reveals sharp disc margins. Pupils equal, briskly reactive to light. Extraocular movements full without nystagmus. Visual fields full to confrontation. Hearing intact. Facial  sensation intact. Face, tongue, palate moves normally and symmetrically.  Motor: Normal bulk and tone. Normal strength in all tested extremity muscles. Diminished fine finger movements on the left. Orbits right over left upper extremity Sensory.: intact to touch and pinprick and vibratory sensation.  Coordination: Rapid alternating movements normal in all extremities. Finger-to-nose and heel-to-shin performed accurately bilaterally. Gait and Station: Arises from chair without difficulty. Stance is normal. Gait demonstrates normal stride length and balance . Able to heel, toe and tandem walk without difficulty.  Reflexes: 1+ and symmetric. Toes downgoing.   NIHSS 0 Modified Rankin  1   ASSESSMENT: 52 year patient with right subcortical and thalamic infarct from small vessel disease in October 2014 with vascular risk factors of hypertension only.    PLAN: Continue aspirin daily and POINT study medication daily. Keep upcoming research end of study visit in one month. Monitor blood pressure and consider treatment if consistently above 130/90. Add coenzyme Q 10 200 mg twice daily to help with statin related myalgias. Followup with Brooke Fife Lam,NP in 3 months.

## 2013-12-07 NOTE — Patient Instructions (Addendum)
Continue aspirin daily and POINT study medication daily. Keep upcoming research end of study visit in one month. Monitor blood pressure and consider treatment if consistently above 130/90. Add coenzyme Q 10 200 mg twice daily to help with statin related myalgias. Followup with Larita Fife Lam,NP in 3 months.

## 2013-12-18 ENCOUNTER — Telehealth: Payer: Self-pay | Admitting: *Deleted

## 2013-12-25 ENCOUNTER — Other Ambulatory Visit: Payer: Self-pay | Admitting: Nurse Practitioner

## 2013-12-26 ENCOUNTER — Other Ambulatory Visit: Payer: Self-pay

## 2013-12-26 ENCOUNTER — Other Ambulatory Visit: Payer: Self-pay | Admitting: Family Medicine

## 2013-12-26 MED ORDER — ASPIRIN EC 325 MG PO TBEC: 325.0000 mg | DELAYED_RELEASE_TABLET | Freq: Every day | ORAL | Status: DC

## 2014-01-01 ENCOUNTER — Encounter (INDEPENDENT_AMBULATORY_CARE_PROVIDER_SITE_OTHER): Payer: Self-pay

## 2014-01-01 DIAGNOSIS — Z0289 Encounter for other administrative examinations: Secondary | ICD-10-CM

## 2014-01-15 ENCOUNTER — Ambulatory Visit: Payer: BC Managed Care – PPO | Admitting: Nurse Practitioner

## 2014-02-05 ENCOUNTER — Ambulatory Visit: Payer: BC Managed Care – PPO | Admitting: Nurse Practitioner

## 2014-02-20 ENCOUNTER — Ambulatory Visit (INDEPENDENT_AMBULATORY_CARE_PROVIDER_SITE_OTHER): Payer: BC Managed Care – PPO | Admitting: Family Medicine

## 2014-02-20 ENCOUNTER — Encounter: Payer: Self-pay | Admitting: Family Medicine

## 2014-02-20 VITALS — BP 146/85 | HR 94 | Temp 99.3°F | Ht 62.0 in | Wt 163.4 lb

## 2014-02-20 DIAGNOSIS — J029 Acute pharyngitis, unspecified: Secondary | ICD-10-CM

## 2014-02-20 DIAGNOSIS — J329 Chronic sinusitis, unspecified: Secondary | ICD-10-CM

## 2014-02-20 DIAGNOSIS — J111 Influenza due to unidentified influenza virus with other respiratory manifestations: Secondary | ICD-10-CM

## 2014-02-20 DIAGNOSIS — R509 Fever, unspecified: Secondary | ICD-10-CM

## 2014-02-20 DIAGNOSIS — R52 Pain, unspecified: Secondary | ICD-10-CM

## 2014-02-20 LAB — POCT INFLUENZA A/B
Influenza A, POC: NEGATIVE
Influenza B, POC: POSITIVE

## 2014-02-20 LAB — POCT RAPID STREP A (OFFICE): Rapid Strep A Screen: NEGATIVE

## 2014-02-20 MED ORDER — OSELTAMIVIR PHOSPHATE 75 MG PO CAPS: 75.0000 mg | ORAL_CAPSULE | Freq: Two times a day (BID) | ORAL | Status: DC

## 2014-02-20 MED ORDER — AZITHROMYCIN 250 MG PO TABS: ORAL_TABLET | ORAL | Status: DC

## 2014-02-20 NOTE — Progress Notes (Signed)
   Subjective:    Patient ID: Brooke Newton, female    DOB: Oct 11, 1961, 53 y.o.   MRN: [id number]  HPI This 53 y.o. female presents for evaluation of fever, sinus congestion, and uri sx's.   Review of Systems No chest pain, SOB, HA, dizziness, vision change, N/V, diarrhea, constipation, dysuria, urinary urgency or frequency, myalgias, arthralgias or rash.     Objective:   Physical Exam  Vital signs noted  Well developed well nourished female.  HEENT - Head atraumatic Normocephalic                Eyes - PERRLA, Conjuctiva - clear Sclera- Clear EOMI                Ears - EAC's Wnl TM's Wnl Gross Hearing WNL                Nose - Nares patent                 Throat - oropharanx wnl Respiratory - Lungs CTA bilateral Cardiac - RRR S1 and S2 without murmur GI - Abdomen soft Nontender and bowel sounds active x 4 Extremities - No edema. Neuro - Grossly intact.      Results for orders placed in visit on 02/20/14  POCT INFLUENZA A/B      Result Value Ref Range   Influenza A, POC Negative     Influenza B, POC Positive    POCT RAPID STREP A (OFFICE)      Result Value Ref Range   Rapid Strep A Screen Negative  Negative   Assessment & Plan:  Sinusitis - Plan: POCT Influenza A/B, POCT rapid strep A, azithromycin (ZITHROMAX) 250 MG tablet  Sore throat - Plan: POCT Influenza A/B, POCT rapid strep A, azithromycin (ZITHROMAX) 250 MG tablet, oseltamivir (TAMIFLU) 75 MG capsule  Fever - Plan: POCT Influenza A/B, POCT rapid strep A, azithromycin (ZITHROMAX) 250 MG tablet, oseltamivir (TAMIFLU) 75 MG capsule  Body aches - Plan: POCT Influenza A/B, POCT rapid strep A, azithromycin (ZITHROMAX) 250 MG tablet, oseltamivir (TAMIFLU) 75 MG capsule  Influenza with other respiratory manifestations - Plan: oseltamivir (TAMIFLU) 75 MG capsule  Push po fluids, rest, tylenol and motrin otc prn as directed for fever, arthralgias, and myalgias.  Follow up prn if sx's continue or persist.  Deatra CanterWilliam J  [place] FNP

## 2014-03-03 ENCOUNTER — Other Ambulatory Visit: Payer: Self-pay | Admitting: Nurse Practitioner

## 2014-03-05 ENCOUNTER — Encounter (INDEPENDENT_AMBULATORY_CARE_PROVIDER_SITE_OTHER): Payer: Self-pay

## 2014-03-05 ENCOUNTER — Encounter: Payer: Self-pay | Admitting: Nurse Practitioner

## 2014-03-05 ENCOUNTER — Ambulatory Visit (INDEPENDENT_AMBULATORY_CARE_PROVIDER_SITE_OTHER): Payer: BC Managed Care – PPO | Admitting: Nurse Practitioner

## 2014-03-05 VITALS — BP 150/88 | HR 76 | Temp 97.1°F | Ht 62.0 in | Wt 162.0 lb

## 2014-03-05 DIAGNOSIS — I10 Essential (primary) hypertension: Secondary | ICD-10-CM

## 2014-03-05 DIAGNOSIS — E039 Hypothyroidism, unspecified: Secondary | ICD-10-CM

## 2014-03-05 DIAGNOSIS — I6350 Cerebral infarction due to unspecified occlusion or stenosis of unspecified cerebral artery: Secondary | ICD-10-CM

## 2014-03-05 DIAGNOSIS — I639 Cerebral infarction, unspecified: Secondary | ICD-10-CM

## 2014-03-05 DIAGNOSIS — E785 Hyperlipidemia, unspecified: Secondary | ICD-10-CM

## 2014-03-05 DIAGNOSIS — E876 Hypokalemia: Secondary | ICD-10-CM

## 2014-03-05 MED ORDER — LISINOPRIL 10 MG PO TABS: 10.0000 mg | ORAL_TABLET | Freq: Every day | ORAL | Status: DC

## 2014-03-05 NOTE — Patient Instructions (Signed)

## 2014-03-05 NOTE — Progress Notes (Signed)
Subjective:    Patient ID: Brooke Newton, female    DOB: 1961-09-28, 53 y.o.   MRN: [id number]  Patient  Here today for follow up of chronic medical problems. SHe is doing well- Blood pressure elevated today- At home it runs 704-888 syatolic and she has lost 16lbs.  Hypertension This is a chronic problem. The current episode started more than 1 year ago. The problem is unchanged. The problem is uncontrolled. Pertinent negatives include no blurred vision, chest pain, headaches, neck pain, orthopnea, palpitations, peripheral edema or shortness of breath. There are no associated agents to hypertension. Risk factors for coronary artery disease include dyslipidemia, obesity and post-menopausal state. Past treatments include nothing. The current treatment provides moderate improvement. Compliance problems include diet and exercise.  Hypertensive end-organ damage includes CVA (2 weeks ago) and a thyroid problem.  Hyperlipidemia This is a chronic problem. The current episode started more than 1 year ago. The problem is uncontrolled. Recent lipid tests were reviewed and are high. Exacerbating diseases include hypothyroidism and obesity. She has no history of diabetes. There are no known factors aggravating her hyperlipidemia. Pertinent negatives include no chest pain or shortness of breath. Current antihyperlipidemic treatment includes statins. The current treatment provides moderate improvement of lipids. Compliance problems include adherence to diet and adherence to exercise.  Risk factors for coronary artery disease include hypertension, post-menopausal and family history.  Thyroid Problem Presents for follow-up (hypothyroidism) visit. Patient reports no anxiety, cold intolerance, constipation, depressed mood, diaphoresis, diarrhea, dry skin, heat intolerance, hoarse voice, leg swelling, menstrual problem, palpitations, visual change, weight gain or weight loss. Her past medical history is significant for  hyperlipidemia. There is no history of diabetes.  CVA-  Light stroke- No residual effects- doing well- PT at home but is ready to be released   Review of Systems  Constitutional: Negative for weight loss, weight gain and diaphoresis.  HENT: Negative for hoarse voice.   Eyes: Negative for blurred vision.  Respiratory: Negative for shortness of breath.   Cardiovascular: Negative for chest pain, palpitations and orthopnea.  Gastrointestinal: Negative for diarrhea and constipation.  Endocrine: Negative for cold intolerance and heat intolerance.  Genitourinary: Negative for menstrual problem.  Musculoskeletal: Negative for neck pain.  Neurological: Negative for headaches.       Objective:   Physical Exam  Constitutional: She is oriented to person, place, and time. She appears well-developed and well-nourished.  HENT:  Nose: Nose normal.  Mouth/Throat: Oropharynx is clear and moist.  Eyes: EOM are normal.  Neck: Trachea normal, normal range of motion and full passive range of motion without pain. Neck supple. No JVD present. Carotid bruit is not present. No thyromegaly present.  Cardiovascular: Normal rate, regular rhythm, normal heart sounds and intact distal pulses.  Exam reveals no gallop and no friction rub.   No murmur heard. Pulmonary/Chest: Effort normal and breath sounds normal.  Abdominal: Soft. Bowel sounds are normal. She exhibits no distension and no mass. There is no tenderness.  Musculoskeletal: Normal range of motion.  Lymphadenopathy:    She has no cervical adenopathy.  Neurological: She is alert and oriented to person, place, and time. She has normal reflexes.  Skin: Skin is warm and dry.  Psychiatric: She has a normal mood and affect. Her behavior is normal. Judgment and thought content normal.    BP 173/93  Pulse 76  Temp(Src) 97.1 F (36.2 C) (Oral)  Ht _0  (1.575 m)  Wt 162 lb (73.483 kg)  BMI 29.62 kg/m2  Assessment & Plan:    1. Unspecified  hypothyroidism   2. Hypokalemia   3. Hyperlipidemia LDL goal < 100   4. Essential hypertension, benign   5. Acute CVA (cerebrovascular accident)    Orders Placed This Encounter  Procedures  . CMP14+EGFR  . NMR, lipoprofile  . Thyroid Panel With TSH   Meds ordered this encounter  Medications  . lisinopril (PRINIVIL,ZESTRIL) 10 MG tablet    Sig: Take 1 tablet (10 mg total) by mouth daily.    Dispense:  90 tablet    Refill:  3    Order Specific Question:  Supervising Provider    Answer:  Chipper Herb [[id number]]  add lisinopril to meds for blood pressure Keep diary of blood pressure Labs pending Health maintenance reviewed Diet and exercise encouraged Continue all meds Follow up  In 3 months   [place], FNP

## 2014-03-06 LAB — NMR, LIPOPROFILE
Cholesterol: 136 mg/dL (ref <200)
HDL Cholesterol by NMR: 45 mg/dL (ref >=40)
HDL Particle Number: 29.3 umol/L — ABNORMAL LOW (ref >=30.5)
LDL Particle Number: 1088 nmol/L — ABNORMAL HIGH (ref <1000)
LDL SIZE: 20.6 nm (ref >20.5)
LDLC SERPL CALC-MCNC: 75 mg/dL (ref <100)
LP-IR SCORE: 57 — AB (ref <=45)
Small LDL Particle Number: 590 nmol/L — ABNORMAL HIGH (ref <=527)
TRIGLYCERIDES BY NMR: 81 mg/dL (ref <150)

## 2014-03-06 LAB — CMP14+EGFR
ALT: 23 IU/L (ref 0–32)
AST: 24 IU/L (ref 0–40)
Albumin/Globulin Ratio: 1.7 (ref 1.1–2.5)
Albumin: 4.3 g/dL (ref 3.5–5.5)
Alkaline Phosphatase: 93 IU/L (ref 39–117)
BILIRUBIN TOTAL: 0.5 mg/dL (ref 0.0–1.2)
BUN/Creatinine Ratio: 13 (ref 9–23)
BUN: 10 mg/dL (ref 6–24)
CHLORIDE: 103 mmol/L (ref 97–108)
CO2: 24 mmol/L (ref 18–29)
Calcium: 9.9 mg/dL (ref 8.7–10.2)
Creatinine, Ser: 0.75 mg/dL (ref 0.57–1.00)
GFR, EST AFRICAN AMERICAN: 105 mL/min/{1.73_m2} (ref >59)
GFR, EST NON AFRICAN AMERICAN: 91 mL/min/{1.73_m2} (ref >59)
GLUCOSE: 103 mg/dL — AB (ref 65–99)
Globulin, Total: 2.6 g/dL (ref 1.5–4.5)
POTASSIUM: 4.9 mmol/L (ref 3.5–5.2)
SODIUM: 141 mmol/L (ref 134–144)
TOTAL PROTEIN: 6.9 g/dL (ref 6.0–8.5)

## 2014-03-06 LAB — THYROID PANEL WITH TSH
Free Thyroxine Index: 2.8 (ref 1.2–4.9)
T3 Uptake Ratio: 26 % (ref 24–39)
T4 TOTAL: 10.7 ug/dL (ref 4.5–12.0)
TSH: 4.88 u[IU]/mL — ABNORMAL HIGH (ref 0.450–4.500)

## 2014-03-16 ENCOUNTER — Ambulatory Visit: Payer: BC Managed Care – PPO | Admitting: Nurse Practitioner

## 2014-03-19 ENCOUNTER — Ambulatory Visit: Payer: BC Managed Care – PPO | Admitting: Nurse Practitioner

## 2014-04-03 NOTE — Telephone Encounter (Signed)
Closing encounter

## 2014-06-04 ENCOUNTER — Other Ambulatory Visit: Payer: Self-pay | Admitting: Nurse Practitioner

## 2014-06-11 ENCOUNTER — Encounter: Payer: Self-pay | Admitting: Nurse Practitioner

## 2014-06-11 ENCOUNTER — Ambulatory Visit (INDEPENDENT_AMBULATORY_CARE_PROVIDER_SITE_OTHER): Payer: BC Managed Care – PPO | Admitting: Nurse Practitioner

## 2014-06-11 VITALS — BP 138/86 | HR 75 | Temp 97.6°F | Ht 62.0 in | Wt 159.0 lb

## 2014-06-11 DIAGNOSIS — Z6829 Body mass index (BMI) 29.0-29.9, adult: Secondary | ICD-10-CM

## 2014-06-11 DIAGNOSIS — I10 Essential (primary) hypertension: Secondary | ICD-10-CM

## 2014-06-11 DIAGNOSIS — E039 Hypothyroidism, unspecified: Secondary | ICD-10-CM

## 2014-06-11 DIAGNOSIS — E876 Hypokalemia: Secondary | ICD-10-CM

## 2014-06-11 DIAGNOSIS — E785 Hyperlipidemia, unspecified: Secondary | ICD-10-CM

## 2014-06-11 DIAGNOSIS — Z6830 Body mass index (BMI) 30.0-30.9, adult: Secondary | ICD-10-CM | POA: Insufficient documentation

## 2014-06-11 DIAGNOSIS — Z713 Dietary counseling and surveillance: Secondary | ICD-10-CM

## 2014-06-11 MED ORDER — LOSARTAN POTASSIUM 50 MG PO TABS: 50.0000 mg | ORAL_TABLET | Freq: Every day | ORAL | Status: DC

## 2014-06-11 NOTE — Patient Instructions (Signed)

## 2014-06-11 NOTE — Progress Notes (Signed)
Subjective:  ;od  Patient ID: Brooke Newton, female    DOB: 04/19/1961, 53 y.o.   MRN: [id number]  Patient here today for follow up of chronic medical problems.- Only complaint today is an irritating dry cough. Seh started on ACE 3 months ago.  Hypertension This is a chronic problem. The current episode started more than 1 year ago. The problem is unchanged. The problem is uncontrolled. Pertinent negatives include no blurred vision, chest pain, headaches, neck pain, orthopnea, palpitations, peripheral edema or shortness of breath. There are no associated agents to hypertension. Risk factors for coronary artery disease include dyslipidemia, obesity and post-menopausal state. Past treatments include nothing. The current treatment provides moderate improvement. Compliance problems include diet and exercise.  Hypertensive end-organ damage includes CVA (2 weeks ago) and a thyroid problem.  Hyperlipidemia This is a chronic problem. The current episode started more than 1 year ago. The problem is uncontrolled. Recent lipid tests were reviewed and are high. Exacerbating diseases include hypothyroidism and obesity. She has no history of diabetes. There are no known factors aggravating her hyperlipidemia. Pertinent negatives include no chest pain or shortness of breath. Current antihyperlipidemic treatment includes statins. The current treatment provides moderate improvement of lipids. Compliance problems include adherence to diet and adherence to exercise.  Risk factors for coronary artery disease include hypertension, post-menopausal and family history.  Thyroid Problem Presents for follow-up (hypothyroidism) visit. Patient reports no anxiety, cold intolerance, constipation, depressed mood, diaphoresis, diarrhea, dry skin, heat intolerance, hoarse voice, leg swelling, menstrual problem, palpitations, visual change, weight gain or weight loss. Her past medical history is significant for hyperlipidemia. There is  no history of diabetes.  CVA-  Light stroke- No residual effects- doing well-   Review of Systems  Constitutional: Negative for weight loss, weight gain and diaphoresis.  HENT: Negative for hoarse voice.   Eyes: Negative for blurred vision.  Respiratory: Negative for shortness of breath.   Cardiovascular: Negative for chest pain, palpitations and orthopnea.  Gastrointestinal: Negative for diarrhea and constipation.  Endocrine: Negative for cold intolerance and heat intolerance.  Genitourinary: Negative for menstrual problem.  Musculoskeletal: Negative for neck pain.  Neurological: Negative for headaches.       Objective:   Physical Exam  Constitutional: She is oriented to person, place, and time. She appears well-developed and well-nourished.  HENT:  Nose: Nose normal.  Mouth/Throat: Oropharynx is clear and moist.  Eyes: EOM are normal.  Neck: Trachea normal, normal range of motion and full passive range of motion without pain. Neck supple. No JVD present. Carotid bruit is not present. No thyromegaly present.  Cardiovascular: Normal rate, regular rhythm, normal heart sounds and intact distal pulses.  Exam reveals no gallop and no friction rub.   No murmur heard. Varicose veins right ankle  Pulmonary/Chest: Effort normal and breath sounds normal.  Abdominal: Soft. Bowel sounds are normal. She exhibits no distension and no mass. There is no tenderness.  Musculoskeletal: Normal range of motion.  Lymphadenopathy:    She has no cervical adenopathy.  Neurological: She is alert and oriented to person, place, and time. She has normal reflexes.  Skin: Skin is warm and dry.  Psychiatric: She has a normal mood and affect. Her behavior is normal. Judgment and thought content normal.    BP 138/86  Pulse 75  Temp(Src) 97.6 F (36.4 C) (Oral)  Ht 5' 2" (1.575 m)  Wt 159 lb (72.122 kg)  BMI 29.07 kg/m2       Assessment & Plan:     1. Unspecified hypothyroidism   2. Hypokalemia    3. Hyperlipidemia with target LDL less than 100   4. Essential hypertension, benign   5. BMI 29.0-29.9,adult   6. Weight loss counseling, encounter for    Orders Placed This Encounter  Procedures  . CMP14+EGFR  . NMR, lipoprofile  . Thyroid Panel With TSH   Meds ordered this encounter  Medications  . losartan (COZAAR) 50 MG tablet    Sig: Take 1 tablet (50 mg total) by mouth daily.    Dispense:  30 tablet    Refill:  5    Order Specific Question:  Supervising Provider    Answer:  Chipper Herb [[id number]]  changed lisinopril to losartan to see if helps with cough hemoccult cards given to patient with directions Labs pending Health maintenance reviewed Diet and exercise encouraged Continue all meds Follow up  In 3 months   [place], FNP

## 2014-06-12 LAB — CMP14+EGFR
ALBUMIN: 4.4 g/dL (ref 3.5–5.5)
ALK PHOS: 86 IU/L (ref 39–117)
ALT: 27 IU/L (ref 0–32)
AST: 24 IU/L (ref 0–40)
Albumin/Globulin Ratio: 1.8 (ref 1.1–2.5)
BILIRUBIN TOTAL: 0.3 mg/dL (ref 0.0–1.2)
BUN / CREAT RATIO: 17 (ref 9–23)
BUN: 12 mg/dL (ref 6–24)
CHLORIDE: 102 mmol/L (ref 97–108)
CO2: 25 mmol/L (ref 18–29)
Calcium: 9.7 mg/dL (ref 8.7–10.2)
Creatinine, Ser: 0.72 mg/dL (ref 0.57–1.00)
GFR calc non Af Amer: 95 mL/min/{1.73_m2} (ref >59)
GFR, EST AFRICAN AMERICAN: 109 mL/min/{1.73_m2} (ref >59)
Globulin, Total: 2.4 g/dL (ref 1.5–4.5)
Glucose: 109 mg/dL — ABNORMAL HIGH (ref 65–99)
Potassium: 4.5 mmol/L (ref 3.5–5.2)
SODIUM: 140 mmol/L (ref 134–144)
Total Protein: 6.8 g/dL (ref 6.0–8.5)

## 2014-06-12 LAB — NMR, LIPOPROFILE
CHOLESTEROL: 137 mg/dL (ref 100–199)
HDL Cholesterol by NMR: 52 mg/dL (ref >39)
HDL PARTICLE NUMBER: 35.6 umol/L (ref >=30.5)
LDL PARTICLE NUMBER: 905 nmol/L (ref <1000)
LDL SIZE: 21.2 nm (ref >20.5)
LDLC SERPL CALC-MCNC: 73 mg/dL (ref 0–99)
LP-IR Score: 39 (ref <=45)
Small LDL Particle Number: 414 nmol/L (ref <=527)
Triglycerides by NMR: 58 mg/dL (ref 0–149)

## 2014-06-12 LAB — THYROID PANEL WITH TSH
Free Thyroxine Index: 2.8 (ref 1.2–4.9)
T3 UPTAKE RATIO: 27 % (ref 24–39)
T4, Total: 10.4 ug/dL (ref 4.5–12.0)
TSH: 4.21 u[IU]/mL (ref 0.450–4.500)

## 2014-06-20 ENCOUNTER — Encounter: Payer: Self-pay | Admitting: Nurse Practitioner

## 2014-06-20 ENCOUNTER — Ambulatory Visit (INDEPENDENT_AMBULATORY_CARE_PROVIDER_SITE_OTHER): Payer: BC Managed Care – PPO | Admitting: Nurse Practitioner

## 2014-06-20 VITALS — BP 144/85 | HR 71 | Wt 157.4 lb

## 2014-06-20 DIAGNOSIS — R531 Weakness: Secondary | ICD-10-CM

## 2014-06-20 DIAGNOSIS — M6281 Muscle weakness (generalized): Secondary | ICD-10-CM

## 2014-06-20 NOTE — Patient Instructions (Signed)
Continue aspirin daily and POINT study medication daily.   Monitor blood pressure and consider treatment if consistently above 130/90.  Add coenzyme Q 10 200 mg twice daily to help with statin related myalgias.  Followup with in 6 months, sooner as needed.  Stroke Prevention Some medical conditions and behaviors are associated with an increased chance of having a stroke. You may prevent a stroke by making healthy choices and managing medical conditions. HOW CAN I REDUCE MY RISK OF HAVING A STROKE?   Stay physically active. Get at least 30 minutes of activity on most or all days.  Do not smoke. It may also be helpful to avoid exposure to secondhand smoke.  Limit alcohol use. Moderate alcohol use is considered to be:  No more than 2 drinks per day for men.  No more than 1 drink per day for nonpregnant women.  Eat healthy foods. This involves  Eating 5 or more servings of fruits and vegetables a day.  Following a diet that addresses high blood pressure (hypertension), high cholesterol, diabetes, or obesity.  Manage your cholesterol levels.  A diet low in saturated fat, trans fat, and cholesterol and high in fiber may control cholesterol levels.  Take any prescribed medicines to control cholesterol as directed by your health care provider.  Manage your diabetes.  A controlled-carbohydrate, controlled-sugar diet is recommended to manage diabetes.  Take any prescribed medicines to control diabetes as directed by your health care provider.  Control your hypertension.  A low-salt (sodium), low-saturated fat, low-trans fat, and low-cholesterol diet is recommended to manage hypertension.  Take any prescribed medicines to control hypertension as directed by your health care provider.  Maintain a healthy weight.  A reduced-calorie, low-sodium, low-saturated fat, low-trans fat, low-cholesterol diet is recommended to manage weight.  Stop drug abuse.  Avoid taking birth control  pills.  Talk to your health care provider about the risks of taking birth control pills if you are over 271 years old, smoke, get migraines, or have ever had a blood clot.  Get evaluated for sleep disorders (sleep apnea).  Talk to your health care provider about getting a sleep evaluation if you snore a lot or have excessive sleepiness.  Take medicines as directed by your health care provider.  For some people, aspirin or blood thinners (anticoagulants) are helpful in reducing the risk of forming abnormal blood clots that can lead to stroke. If you have the irregular heart rhythm of atrial fibrillation, you should be on a blood thinner unless there is a good reason you cannot take them.  Understand all your medicine instructions.  Make sure that other other conditions (such as anemia or atherosclerosis) are addressed. SEEK IMMEDIATE MEDICAL CARE IF:   You have sudden weakness or numbness of the face, arm, or leg, especially on one side of the body.  Your face or eyelid droops to one side.  You have sudden confusion.  You have trouble speaking (aphasia) or understanding.  You have sudden trouble seeing in one or both eyes.  You have sudden trouble walking.  You have dizziness.  You have a loss of balance or coordination.  You have a sudden, severe headache with no known cause.  You have new chest pain or an irregular heartbeat. Any of these symptoms may represent a serious problem that is an emergency. Do not wait to see if the symptoms will go away. Get medical help at once. Call your local emergency services  (911 in [place] Do not drive yourself  to the hospital. Document Released: 01/14/2005 Document Revised: 09/27/2013 Document Reviewed: 06/09/2013 [facility] Patient Information 2015 [employer] [place], [place]. This information is not intended to replace advice given to you by your health care provider. Make sure you discuss any questions you have with your health care provider.

## 2014-06-20 NOTE — Progress Notes (Signed)
PATIENT: Brooke Newton DOB: [birth year]  REASON FOR VISIT: routine follow up for stroke HISTORY FROM: patient  HISTORY OF PRESENT ILLNESS: [age] year caucasian lady returns for 1687-month followup visit after hospital admission for stroke on 09/28/13. She presented with sudden onset of left-sided weakness and beyond time for intervention. CT of the head was unremarkable but MRI scan showed a right corona radiata and thalamic lacunar infarct. MRA of the brain showed mild intracranial atherosclerotic changes without large vessel stenosis. Carotid ultrasound showed no significant extracranial stenosis. Lipid profile was significant for total cholesterol 191, triglycerides 97, LDL 126 mg percent. Hemoglobin A1c was 6.0. Transthoracic echo showed normal ejection fraction. Patient had mild left-sided weakness which improved during the hospitalization. She was discharged home on aspirin and POINT study medication (placebo versus plavix) after she decided to participate in the Study for stroke prevention. She was also started on Pravachol for elevated lipids. She states she done well since discharge she finished home therapy. She's made full recovery. Her blood pressure is mildly elevated in office today at 144/85.  She was recently changed to Losartan from lisinopril due to cough.  She finished the POINT study months ago. She is tolerating aspirin without significant bleeding, bruising. 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   ROS:  14 system review of systems is positive for nothing and all systems negative.  ALLERGIES: No Known Allergies  HOME MEDICATIONS: Outpatient Prescriptions Prior to Visit  Medication Sig Dispense Refill  . aspirin EC 325 MG tablet TAKE 1 TABLET DAILY  90 tablet  2  . levothyroxine (SYNTHROID, LEVOTHROID) 75 MCG tablet TAKE 1 TABLET DAILY  90 tablet  2  . losartan (COZAAR) 50 MG tablet Take 1 tablet (50 mg total) by mouth daily.   30 tablet  5  . simvastatin (ZOCOR) 40 MG tablet TAKE 1 TABLET BY MOUTH AT 6PM  30 tablet  0   No facility-administered medications prior to visit.    PHYSICAL EXAM Filed Vitals:   06/20/14 1540  BP: 144/85  Pulse: 71  Weight: 157 lb 6.4 oz (71.396 kg)   Body mass index is 28.78 kg/(m^2). No exam data present No flowsheet data found.  No flowsheet data found.   Physical Exam  General: well developed, well nourished, seated, in no evident distress  Head: head normocephalic and atraumatic. Orohparynx benign  Neck: supple with no carotid or supraclavicular bruits  Cardiovascular: regular rate and rhythm, no murmurs  Musculoskeletal: no deformity  Skin: no rash/petichiae  Vascular: Normal pulses all extremities   Neurologic Exam  Mental Status: Awake and fully alert. Oriented to place and time. Recent and remote memory intact. Attention span, concentration and fund of knowledge appropriate. Mood and affect appropriate.  Cranial Nerves: Fundoscopic exam reveals sharp disc margins. Pupils equal, briskly reactive to light. Extraocular movements full without nystagmus. Visual fields full to confrontation. Hearing intact. Facial sensation intact. Face, tongue, palate moves normally and symmetrically.  Motor: Normal bulk and tone. Normal strength in all tested extremity muscles. Diminished fine finger movements on the left.   Sensory: intact to touch and pinprick and vibratory sensation.  Coordination: Rapid alternating movements normal in all extremities. Finger-to-nose and heel-to-shin performed accurately bilaterally.  Gait and Station: Arises from chair without difficulty. Stance is normal. Gait demonstrates normal stride length and balance. Able to heel, toe and tandem walk without difficulty.  Reflexes: 1+ and symmetric. Toes downgoing.   ASSESSMENT: 53 year-old  Caucasian female with right subcortical and thalamic infarct from small vessel disease in October 2014 with vascular risk  factors of hypertension only. She has made a full functional recovery.  PLAN:  Continue aspirin 325 mg orally every day  for secondary stroke prevention and maintain strict control of hypertension with blood pressure goal below 140/90, diabetes with hemoglobin A1c goal below 6.5% and lipids with LDL cholesterol goal below 100 mg/dL.  Followup with in 6 months, sooner as needed.  Brooke FearLYNN E. Arianna Delsanto, MSN, NP-C 06/20/2014, 3:47 PM [facility] [address], [address] [place], [place] [address] [phone]  Note: This document was prepared with digital dictation and possible smart phrase technology. Any transcriptional errors that result from this process are unintentional.

## 2014-06-22 NOTE — Progress Notes (Signed)
I agree with above 

## 2014-08-30 ENCOUNTER — Other Ambulatory Visit: Payer: Self-pay | Admitting: Nurse Practitioner

## 2014-09-20 ENCOUNTER — Other Ambulatory Visit: Payer: Self-pay | Admitting: *Deleted

## 2014-09-20 NOTE — Telephone Encounter (Signed)
Notifed at last refill that NTBS. Please advise on refill for 90 day

## 2014-09-23 NOTE — Telephone Encounter (Signed)
Refill denied   Patient NTBS for follow up and lab work   

## 2014-09-24 ENCOUNTER — Ambulatory Visit (INDEPENDENT_AMBULATORY_CARE_PROVIDER_SITE_OTHER): Payer: BC Managed Care – PPO | Admitting: Nurse Practitioner

## 2014-09-24 ENCOUNTER — Other Ambulatory Visit: Payer: Self-pay | Admitting: *Deleted

## 2014-09-24 ENCOUNTER — Encounter: Payer: Self-pay | Admitting: Nurse Practitioner

## 2014-09-24 VITALS — BP 132/86 | HR 84 | Temp 97.0°F | Ht 62.0 in | Wt 152.6 lb

## 2014-09-24 DIAGNOSIS — I10 Essential (primary) hypertension: Secondary | ICD-10-CM

## 2014-09-24 DIAGNOSIS — E039 Hypothyroidism, unspecified: Secondary | ICD-10-CM

## 2014-09-24 DIAGNOSIS — Z23 Encounter for immunization: Secondary | ICD-10-CM

## 2014-09-24 DIAGNOSIS — E785 Hyperlipidemia, unspecified: Secondary | ICD-10-CM

## 2014-09-24 MED ORDER — SIMVASTATIN 40 MG PO TABS: ORAL_TABLET | ORAL | Status: DC

## 2014-09-24 NOTE — Patient Instructions (Signed)

## 2014-09-24 NOTE — Progress Notes (Signed)
Subjective:  ;od  Patient ID: Brooke Newton, female    DOB: 1961/05/17, 53 y.o.   MRN: [id number]  Patient here today for follow up of chronic medical problems.  Hypertension This is a chronic problem. The current episode started more than 1 year ago. The problem is unchanged. The problem is uncontrolled. Pertinent negatives include no blurred vision, chest pain, headaches, neck pain, orthopnea, palpitations, peripheral edema or shortness of breath. There are no associated agents to hypertension. Risk factors for coronary artery disease include dyslipidemia, obesity and post-menopausal state. Past treatments include nothing. The current treatment provides moderate improvement. Compliance problems include diet and exercise.  Hypertensive end-organ damage includes CVA (2 weeks ago) and a thyroid problem.  Hyperlipidemia This is a chronic problem. The current episode started more than 1 year ago. The problem is uncontrolled. Recent lipid tests were reviewed and are high. Exacerbating diseases include hypothyroidism and obesity. She has no history of diabetes. There are no known factors aggravating her hyperlipidemia. Pertinent negatives include no chest pain or shortness of breath. Current antihyperlipidemic treatment includes statins. The current treatment provides moderate improvement of lipids. Compliance problems include adherence to diet and adherence to exercise.  Risk factors for coronary artery disease include hypertension, post-menopausal and family history.  Thyroid Problem Presents for follow-up (hypothyroidism) visit. Patient reports no anxiety, cold intolerance, constipation, depressed mood, diaphoresis, diarrhea, dry skin, heat intolerance, hoarse voice, leg swelling, menstrual problem, palpitations, visual change, weight gain or weight loss. Her past medical history is significant for hyperlipidemia. There is no history of diabetes.  CVA-  Light stroke- No residual effects- doing  well-   Review of Systems  Constitutional: Negative for weight loss, weight gain and diaphoresis.  HENT: Negative for hoarse voice.   Eyes: Negative for blurred vision.  Respiratory: Negative for shortness of breath.   Cardiovascular: Negative for chest pain, palpitations and orthopnea.  Gastrointestinal: Negative for diarrhea and constipation.  Endocrine: Negative for cold intolerance and heat intolerance.  Genitourinary: Negative for menstrual problem.  Musculoskeletal: Negative for neck pain.  Neurological: Negative for headaches.       Objective:   Physical Exam  Constitutional: She is oriented to person, place, and time. She appears well-developed and well-nourished.  HENT:  Nose: Nose normal.  Mouth/Throat: Oropharynx is clear and moist.  Eyes: EOM are normal.  Neck: Trachea normal, normal range of motion and full passive range of motion without pain. Neck supple. No JVD present. Carotid bruit is not present. No thyromegaly present.  Cardiovascular: Normal rate, regular rhythm, normal heart sounds and intact distal pulses.  Exam reveals no gallop and no friction rub.   No murmur heard. Varicose veins right ankle  Pulmonary/Chest: Effort normal and breath sounds normal.  Abdominal: Soft. Bowel sounds are normal. She exhibits no distension and no mass. There is no tenderness.  Musculoskeletal: Normal range of motion.  Lymphadenopathy:    She has no cervical adenopathy.  Neurological: She is alert and oriented to person, place, and time. She has normal reflexes.  Skin: Skin is warm and dry.  Psychiatric: She has a normal mood and affect. Her behavior is normal. Judgment and thought content normal.    BP 155/91  Pulse 84  Temp(Src) 97 F (36.1 C) (Oral)  Ht '5\' 2"'  (1.575 m)  Wt 152 lb 9.6 oz (69.219 kg)  BMI 27.90 kg/m2       Assessment & Plan:  1. Hyperlipidemia with target LDL less than 100 - NMR, lipoprofile  2. Essential  hypertension, benign -  BMP8+EGFR  3. Hypothyroidism, unspecified hypothyroidism type - Thyroid Panel With TSH   hemoccult cards given to patient with directions Labs pending Health maintenance reviewed Diet and exercise encouraged Continue all meds Follow up  In 3 months PRN   Mary-Margaret Hassell Done, FNP

## 2014-09-25 ENCOUNTER — Telehealth: Payer: Self-pay | Admitting: Family Medicine

## 2014-09-25 LAB — BMP8+EGFR
BUN / CREAT RATIO: 13 (ref 9–23)
BUN: 11 mg/dL (ref 6–24)
CO2: 26 mmol/L (ref 18–29)
Calcium: 10.3 mg/dL — ABNORMAL HIGH (ref 8.7–10.2)
Chloride: 101 mmol/L (ref 97–108)
Creatinine, Ser: 0.84 mg/dL (ref 0.57–1.00)
GFR calc non Af Amer: 78 mL/min/{1.73_m2} (ref >59)
GFR, EST AFRICAN AMERICAN: 90 mL/min/{1.73_m2} (ref >59)
Glucose: 99 mg/dL (ref 65–99)
Potassium: 5.1 mmol/L (ref 3.5–5.2)
Sodium: 142 mmol/L (ref 134–144)

## 2014-09-25 LAB — NMR, LIPOPROFILE
CHOLESTEROL: 189 mg/dL (ref 100–199)
HDL CHOLESTEROL BY NMR: 51 mg/dL (ref >39)
HDL PARTICLE NUMBER: 34 umol/L (ref >=30.5)
LDL Particle Number: 1276 nmol/L — ABNORMAL HIGH (ref <1000)
LDL Size: 21.3 nm (ref >20.5)
LDLC SERPL CALC-MCNC: 123 mg/dL — AB (ref 0–99)
LP-IR Score: 34 (ref <=45)
Small LDL Particle Number: 489 nmol/L (ref <=527)
Triglycerides by NMR: 74 mg/dL (ref 0–149)

## 2014-09-25 LAB — THYROID PANEL WITH TSH
FREE THYROXINE INDEX: 3.2 (ref 1.2–4.9)
T3 Uptake Ratio: 28 % (ref 24–39)
T4, Total: 11.5 ug/dL (ref 4.5–12.0)
TSH: 2.61 u[IU]/mL (ref 0.450–4.500)

## 2014-09-25 NOTE — Telephone Encounter (Signed)
Message copied by Azalee CourseFULP, Hanifa Antonetti on Tue Sep 25, 2014 10:46 AM ------      Message from: Bennie PieriniMARTIN, MARY-MARGARET      Created: Tue Sep 25, 2014 10:00 AM       Kidney and liver function stable      LDL particle numbers are up- is patient taking simvastatin daily      Thyroid normal      Continue current meds- low fat diet and exercise and recheck in 3 months       ------

## 2014-09-25 NOTE — Telephone Encounter (Signed)
Pt aware of lab results.  rs °

## 2014-11-02 ENCOUNTER — Encounter: Payer: Self-pay | Admitting: Neurology

## 2014-11-08 ENCOUNTER — Other Ambulatory Visit: Payer: Self-pay | Admitting: Nurse Practitioner

## 2014-12-25 ENCOUNTER — Ambulatory Visit: Payer: BC Managed Care – PPO | Admitting: Nurse Practitioner

## 2014-12-31 ENCOUNTER — Ambulatory Visit (INDEPENDENT_AMBULATORY_CARE_PROVIDER_SITE_OTHER): Payer: BLUE CROSS/BLUE SHIELD | Admitting: Nurse Practitioner

## 2014-12-31 ENCOUNTER — Encounter: Payer: Self-pay | Admitting: Nurse Practitioner

## 2014-12-31 VITALS — BP 151/94 | HR 85 | Temp 97.0°F | Ht 62.0 in | Wt 153.0 lb

## 2014-12-31 DIAGNOSIS — I10 Essential (primary) hypertension: Secondary | ICD-10-CM

## 2014-12-31 DIAGNOSIS — Z6829 Body mass index (BMI) 29.0-29.9, adult: Secondary | ICD-10-CM

## 2014-12-31 DIAGNOSIS — I639 Cerebral infarction, unspecified: Secondary | ICD-10-CM

## 2014-12-31 DIAGNOSIS — E785 Hyperlipidemia, unspecified: Secondary | ICD-10-CM

## 2014-12-31 DIAGNOSIS — E039 Hypothyroidism, unspecified: Secondary | ICD-10-CM

## 2014-12-31 MED ORDER — OLMESARTAN MEDOXOMIL 40 MG PO TABS
40.0000 mg | ORAL_TABLET | Freq: Every day | ORAL | Status: DC
Start: 2014-12-31 — End: 2015-01-31

## 2014-12-31 NOTE — Progress Notes (Signed)
Subjective:  ;od  Patient ID: Brooke Newton, female    DOB: 01/14/1961, 54 y.o.   MRN: [id number]  Patient here today for follow up of chronic medical problems. She is complaining of legs feeling weak- has tried holding meds but has seen no difference.  Hypertension This is a chronic problem. The current episode started more than 1 year ago. The problem is uncontrolled. Pertinent negatives include no chest pain, headaches, neck pain, palpitations or shortness of breath. Risk factors for coronary artery disease include dyslipidemia. Past treatments include angiotensin blockers. The current treatment provides moderate improvement. Compliance problems include exercise.  Hypertensive end-organ damage includes CVA (110315) and a thyroid problem.  Hyperlipidemia This is a chronic problem. The current episode started more than 1 year ago. The problem is uncontrolled. Recent lipid tests were reviewed and are high. Exacerbating diseases include hypothyroidism and obesity. She has no history of diabetes. Pertinent negatives include no chest pain or shortness of breath. Current antihyperlipidemic treatment includes statins (has not been her zocor- thought was making legs weak but ahas been off for 3 weeks with no change.). The current treatment provides mild improvement of lipids. Compliance problems include adherence to diet and adherence to exercise.  Risk factors for coronary artery disease include dyslipidemia, hypertension, obesity and post-menopausal.  Thyroid Problem Visit type: hypothyroidism. Patient reports no cold intolerance, constipation, diaphoresis, diarrhea, heat intolerance, menstrual problem, palpitations or visual change. Her past medical history is significant for hyperlipidemia. There is no history of diabetes.  CVA-  Light stroke- No residual effects- doing well-   Review of Systems  Constitutional: Negative for diaphoresis.  Respiratory: Negative for shortness of breath.    Cardiovascular: Negative for chest pain and palpitations.  Gastrointestinal: Negative for diarrhea and constipation.  Endocrine: Negative for cold intolerance and heat intolerance.  Genitourinary: Negative for menstrual problem.  Musculoskeletal: Negative for neck pain.  Neurological: Negative for headaches.       Objective:   Physical Exam  Constitutional: She is oriented to person, place, and time. She appears well-developed and well-nourished. No distress.  HENT:  Head: Normocephalic.  Right Ear: External ear normal.  Nose: Nose normal.  Mouth/Throat: Oropharynx is clear and moist.  Eyes: EOM are normal.  Neck: Trachea normal, normal range of motion and full passive range of motion without pain. Neck supple. No JVD present. Carotid bruit is not present. No thyromegaly present.  Cardiovascular: Normal rate, regular rhythm, normal heart sounds and intact distal pulses.  Exam reveals no gallop and no friction rub.   No murmur heard. Pulmonary/Chest: Effort normal and breath sounds normal.  Abdominal: Soft. Bowel sounds are normal. She exhibits no distension and no mass. There is no tenderness.  Musculoskeletal: Normal range of motion.  Lymphadenopathy:    She has no cervical adenopathy.  Neurological: She is alert and oriented to person, place, and time. She has normal reflexes.  Skin: Skin is warm and dry.  Psychiatric: She has a normal mood and affect. Her behavior is normal. Judgment and thought content normal.    BP 151/94 mmHg  Pulse 85  Temp(Src) 97 F (36.1 C) (Oral)  Ht _0  (1.575 m)  Wt 153 lb (69.4 kg)  BMI 27.98 kg/m2        Assessment & Plan:   1. Acute CVA (cerebrovascular accident) Continue to exercise  2. Essential hypertension, benign Do not add salt to diet - olmesartan (BENICAR) 40 MG tablet; Take 1 tablet (40 mg total) by mouth daily.  Dispense:  14 tablet; Refill: 0 - CMP14+EGFR  3. Hypothyroidism, unspecified hypothyroidism type - Thyroid  Panel With TSH  4. BMI 29.0-29.9,adult Discussed diet and exercise for person with BMI >25 Will recheck weight in 3-6 months   5. Hyperlipidemia with target LDL less than 100 Low fat diet - NMR, lipoprofile   Encouraged to do hemoccult cards given to her at last visit Labs pending Health maintenance reviewed Diet and exercise encouraged Continue all meds Follow up  In 3 month   [place], FNP

## 2014-12-31 NOTE — Patient Instructions (Signed)

## 2015-01-01 LAB — CMP14+EGFR
ALBUMIN: 4.3 g/dL (ref 3.5–5.5)
ALT: 22 IU/L (ref 0–32)
AST: 19 IU/L (ref 0–40)
Albumin/Globulin Ratio: 1.5 (ref 1.1–2.5)
Alkaline Phosphatase: 93 IU/L (ref 39–117)
BUN/Creatinine Ratio: 10 (ref 9–23)
BUN: 8 mg/dL (ref 6–24)
CALCIUM: 10 mg/dL (ref 8.7–10.2)
CHLORIDE: 103 mmol/L (ref 97–108)
CO2: 26 mmol/L (ref 18–29)
CREATININE: 0.82 mg/dL (ref 0.57–1.00)
GFR calc Af Amer: 93 mL/min/{1.73_m2} (ref >59)
GFR calc non Af Amer: 81 mL/min/{1.73_m2} (ref >59)
GLOBULIN, TOTAL: 2.9 g/dL (ref 1.5–4.5)
Glucose: 103 mg/dL — ABNORMAL HIGH (ref 65–99)
Potassium: 4.5 mmol/L (ref 3.5–5.2)
Sodium: 141 mmol/L (ref 134–144)
Total Bilirubin: 0.4 mg/dL (ref 0.0–1.2)
Total Protein: 7.2 g/dL (ref 6.0–8.5)

## 2015-01-01 LAB — THYROID PANEL WITH TSH
Free Thyroxine Index: 3.2 (ref 1.2–4.9)
T3 UPTAKE RATIO: 27 % (ref 24–39)
T4, Total: 11.9 ug/dL (ref 4.5–12.0)
TSH: 2.78 u[IU]/mL (ref 0.450–4.500)

## 2015-01-01 LAB — NMR, LIPOPROFILE
Cholesterol: 199 mg/dL (ref 100–199)
HDL Cholesterol by NMR: 51 mg/dL (ref >39)
HDL PARTICLE NUMBER: 33.7 umol/L (ref >=30.5)
LDL Particle Number: 1283 nmol/L — ABNORMAL HIGH (ref <1000)
LDL Size: 21 nm (ref >20.5)
LDL-C: 128 mg/dL — ABNORMAL HIGH (ref 0–99)
LP-IR SCORE: 42 (ref <=45)
SMALL LDL PARTICLE NUMBER: 624 nmol/L — AB (ref <=527)
Triglycerides by NMR: 102 mg/dL (ref 0–149)

## 2015-01-24 ENCOUNTER — Ambulatory Visit: Payer: BC Managed Care – PPO | Admitting: Neurology

## 2015-01-25 ENCOUNTER — Other Ambulatory Visit: Payer: Self-pay | Admitting: Nurse Practitioner

## 2015-01-25 NOTE — Telephone Encounter (Signed)
This med does not comr in generic- you may ant to call and make sure insurance will pay for it before we send rx.

## 2015-01-29 NOTE — Telephone Encounter (Signed)
Patient called back and was told to contact insurance. She states that she will contact her insurance today and call us back and let us know the cost before we send in.

## 2015-01-31 ENCOUNTER — Telehealth: Payer: Self-pay | Admitting: Nurse Practitioner

## 2015-01-31 MED ORDER — LOSARTAN POTASSIUM 100 MG PO TABS: 100.0000 mg | ORAL_TABLET | Freq: Every day | ORAL | Status: DC

## 2015-01-31 NOTE — Telephone Encounter (Signed)
benicar changed to losartan- keep check of bloodpressure

## 2015-01-31 NOTE — Telephone Encounter (Signed)
Patient wants to see about changing her Benicar due to its expense. Please advise

## 2015-02-01 NOTE — Telephone Encounter (Signed)
Patient advised.

## 2015-02-03 ENCOUNTER — Other Ambulatory Visit: Payer: Self-pay | Admitting: Nurse Practitioner

## 2015-03-08 ENCOUNTER — Other Ambulatory Visit: Payer: Self-pay | Admitting: Nurse Practitioner

## 2015-04-08 ENCOUNTER — Ambulatory Visit (INDEPENDENT_AMBULATORY_CARE_PROVIDER_SITE_OTHER): Payer: BLUE CROSS/BLUE SHIELD | Admitting: Nurse Practitioner

## 2015-04-08 ENCOUNTER — Other Ambulatory Visit: Payer: Self-pay | Admitting: Nurse Practitioner

## 2015-04-08 ENCOUNTER — Encounter: Payer: Self-pay | Admitting: Nurse Practitioner

## 2015-04-08 VITALS — BP 144/84 | HR 74 | Temp 97.0°F | Ht 62.0 in | Wt 155.0 lb

## 2015-04-08 DIAGNOSIS — Z6828 Body mass index (BMI) 28.0-28.9, adult: Secondary | ICD-10-CM

## 2015-04-08 DIAGNOSIS — E039 Hypothyroidism, unspecified: Secondary | ICD-10-CM | POA: Diagnosis not present

## 2015-04-08 DIAGNOSIS — I10 Essential (primary) hypertension: Secondary | ICD-10-CM | POA: Diagnosis not present

## 2015-04-08 DIAGNOSIS — M810 Age-related osteoporosis without current pathological fracture: Secondary | ICD-10-CM

## 2015-04-08 DIAGNOSIS — E785 Hyperlipidemia, unspecified: Secondary | ICD-10-CM

## 2015-04-08 NOTE — Progress Notes (Signed)
Subjective:    Patient ID: Brooke Newton, female    DOB: Sep 13, 1961, 54 y.o.   MRN: [id number]  Patient here today for follow up of chronic medical problems. Pt reports BP at home is 130/70's range and her PB is always on the high side when she's at the doctor's office.   Hypertension This is a chronic problem. The current episode started more than 1 year ago. The problem is uncontrolled. Pertinent negatives include no chest pain, headaches, neck pain, palpitations or shortness of breath. Risk factors for coronary artery disease include dyslipidemia. Past treatments include angiotensin blockers. The current treatment provides moderate improvement. Compliance problems include exercise.  Hypertensive end-organ damage includes CVA (297989) and a thyroid problem.  Hyperlipidemia This is a chronic problem. The current episode started more than 1 year ago. The problem is uncontrolled. Recent lipid tests were reviewed and are high. Exacerbating diseases include hypothyroidism and obesity. She has no history of diabetes. Pertinent negatives include no chest pain or shortness of breath. Current antihyperlipidemic treatment includes statins (has not been her zocor- thought was making legs weak but ahas been off for 3 weeks with no change.). The current treatment provides mild improvement of lipids. Compliance problems include adherence to diet and adherence to exercise.  Risk factors for coronary artery disease include dyslipidemia, hypertension, obesity and post-menopausal.  Thyroid Problem Visit type: hypothyroidism. Patient reports no cold intolerance, constipation, diaphoresis, diarrhea, heat intolerance, menstrual problem, palpitations or visual change. Her past medical history is significant for hyperlipidemia. There is no history of diabetes.  CVA-  Light stroke- No residual effects- doing well-   Review of Systems  Constitutional: Negative for diaphoresis.  Respiratory: Negative for shortness of  breath.   Cardiovascular: Negative for chest pain and palpitations.  Gastrointestinal: Negative for diarrhea and constipation.  Endocrine: Negative for cold intolerance and heat intolerance.  Genitourinary: Negative for menstrual problem.  Musculoskeletal: Negative for neck pain.  Neurological: Negative for headaches.       Objective:   Physical Exam  Constitutional: She is oriented to person, place, and time. She appears well-developed and well-nourished. No distress.  HENT:  Head: Normocephalic.  Right Ear: External ear normal.  Nose: Nose normal.  Mouth/Throat: Oropharynx is clear and moist.  Eyes: EOM are normal.  Neck: Trachea normal, normal range of motion and full passive range of motion without pain. Neck supple. No JVD present. Carotid bruit is not present. No thyromegaly present.  Cardiovascular: Normal rate, regular rhythm, normal heart sounds and intact distal pulses.  Exam reveals no gallop and no friction rub.   No murmur heard. Pulmonary/Chest: Effort normal and breath sounds normal.  Abdominal: Soft. Bowel sounds are normal. She exhibits no distension and no mass. There is no tenderness.  Musculoskeletal: Normal range of motion.  Lymphadenopathy:    She has no cervical adenopathy.  Neurological: She is alert and oriented to person, place, and time. She has normal reflexes.  Skin: Skin is warm and dry.  Psychiatric: She has a normal mood and affect. Her behavior is normal. Judgment and thought content normal.   BP 144/84 mmHg  Pulse 74  Temp(Src) 97 F (36.1 C) (Oral)  Ht 5' 2" (1.575 m)  Wt 155 lb (70.308 kg)  BMI 28.34 kg/m2      Assessment & Plan:  1. Essential hypertension, benign Low salt diet - CMP14+EGFR  2. Hypothyroidism, unspecified hypothyroidism type   3. Hyperlipidemia with target LDL less than 100 Low fat diet  - NMR, lipoprofile  4. BMI 28.0-28.9,adult Encouraged to exercise Monitor carb intake.   Pt will schedule mammogram   hemoccult cards given to patient with directions Labs pending Health maintenance reviewed Diet and exercise encouraged Continue all meds Follow up  In 3 months   [place], FNP

## 2015-04-08 NOTE — Patient Instructions (Signed)

## 2015-04-09 ENCOUNTER — Other Ambulatory Visit: Payer: BLUE CROSS/BLUE SHIELD

## 2015-04-09 ENCOUNTER — Telehealth: Payer: Self-pay

## 2015-04-09 DIAGNOSIS — Z1212 Encounter for screening for malignant neoplasm of rectum: Secondary | ICD-10-CM

## 2015-04-09 LAB — CMP14+EGFR
ALBUMIN: 4.4 g/dL (ref 3.5–5.5)
ALT: 14 IU/L (ref 0–32)
AST: 15 IU/L (ref 0–40)
Albumin/Globulin Ratio: 1.6 (ref 1.1–2.5)
Alkaline Phosphatase: 85 IU/L (ref 39–117)
BILIRUBIN TOTAL: 0.3 mg/dL (ref 0.0–1.2)
BUN / CREAT RATIO: 17 (ref 9–23)
BUN: 13 mg/dL (ref 6–24)
CO2: 23 mmol/L (ref 18–29)
Calcium: 10 mg/dL (ref 8.7–10.2)
Chloride: 102 mmol/L (ref 97–108)
Creatinine, Ser: 0.76 mg/dL (ref 0.57–1.00)
GFR calc non Af Amer: 89 mL/min/{1.73_m2} (ref >59)
GFR, EST AFRICAN AMERICAN: 102 mL/min/{1.73_m2} (ref >59)
Globulin, Total: 2.7 g/dL (ref 1.5–4.5)
Glucose: 96 mg/dL (ref 65–99)
POTASSIUM: 4.5 mmol/L (ref 3.5–5.2)
Sodium: 140 mmol/L (ref 134–144)
Total Protein: 7.1 g/dL (ref 6.0–8.5)

## 2015-04-09 LAB — NMR, LIPOPROFILE
CHOLESTEROL: 172 mg/dL (ref 100–199)
HDL Cholesterol by NMR: 52 mg/dL (ref >39)
HDL PARTICLE NUMBER: 31.7 umol/L (ref >=30.5)
LDL Particle Number: 1164 nmol/L — ABNORMAL HIGH (ref <1000)
LDL SIZE: 21.1 nm (ref >20.5)
LDL-C: 107 mg/dL — ABNORMAL HIGH (ref 0–99)
LP-IR Score: 31 (ref <=45)
SMALL LDL PARTICLE NUMBER: 542 nmol/L — AB (ref <=527)
Triglycerides by NMR: 66 mg/dL (ref 0–149)

## 2015-04-09 NOTE — Progress Notes (Unsigned)
LMOM to return call to x-ray to schedule DEXA

## 2015-04-09 NOTE — Progress Notes (Signed)
lmtcb

## 2015-04-09 NOTE — Progress Notes (Signed)
Lab only 

## 2015-04-10 ENCOUNTER — Telehealth: Payer: Self-pay | Admitting: Nurse Practitioner

## 2015-04-10 LAB — FECAL OCCULT BLOOD, IMMUNOCHEMICAL: Fecal Occult Bld: NEGATIVE

## 2015-04-11 NOTE — Telephone Encounter (Signed)
Letter sent. Patient aware.

## 2015-04-12 ENCOUNTER — Telehealth: Payer: Self-pay | Admitting: *Deleted

## 2015-04-12 NOTE — Telephone Encounter (Signed)
Reminded pt of fu on 04/16/15. She verbalized understanding.

## 2015-04-16 ENCOUNTER — Encounter: Payer: Self-pay | Admitting: Neurology

## 2015-04-16 ENCOUNTER — Ambulatory Visit (INDEPENDENT_AMBULATORY_CARE_PROVIDER_SITE_OTHER): Payer: BLUE CROSS/BLUE SHIELD | Admitting: Neurology

## 2015-04-16 VITALS — BP 146/92 | HR 73 | Wt 155.6 lb

## 2015-04-16 DIAGNOSIS — I639 Cerebral infarction, unspecified: Secondary | ICD-10-CM | POA: Diagnosis not present

## 2015-04-16 DIAGNOSIS — I6381 Other cerebral infarction due to occlusion or stenosis of small artery: Secondary | ICD-10-CM

## 2015-04-16 NOTE — Patient Instructions (Signed)
I had a long d/w patient about her remote stroke, risk for recurrent stroke/TIAs, personally independently reviewed imaging studies and stroke evaluation results and answered questions.Continue aspirin 325 mg orally every day  for secondary stroke prevention and maintain strict control of hypertension with blood pressure goal below 130/90, diabetes with hemoglobin A1c goal below 6.5% and lipids with LDL cholesterol goal below 100 mg/dL. I also advised the patient to eat a healthy diet with plenty of whole grains, cereals, fruits and vegetables, exercise regularly and maintain ideal body weight. Check screening carotid dopplers as they have not been checked since her stroke. No routine followup in the future with me is necessary and she can follow-up with her primary physician. She may be referred back as necessary in the future.

## 2015-04-16 NOTE — Progress Notes (Signed)
PATIENT: Brooke Newton DOB: 03-May-1961  REASON FOR VISIT: routine follow up for stroke HISTORY FROM: patient  HISTORY OF PRESENT ILLNESS: [age] year caucasian lady returns for 6152-month followup visit after hospital admission for stroke on 09/28/13. She presented with sudden onset of left-sided weakness and beyond time for intervention. CT of the head was unremarkable but MRI scan showed a right corona radiata and thalamic lacunar infarct. MRA of the brain showed mild intracranial atherosclerotic changes without large vessel stenosis. Carotid ultrasound showed no significant extracranial stenosis. Lipid profile was significant for total cholesterol 191, triglycerides 97, LDL 126 mg percent. Hemoglobin A1c was 6.0. Transthoracic echo showed normal ejection fraction. Patient had mild left-sided weakness which improved during the hospitalization. She was discharged home on aspirin and POINT study medication (placebo versus plavix) after she decided to participate in the Study for stroke prevention. She was also started on Pravachol for elevated lipids. She states she done well since discharge she finished home therapy. She's made full recovery. Her blood pressure is mildly elevated in office today at 144/85.  She was recently changed to Losartan from lisinopril due to cough.  She finished the POINT study months ago. She is tolerating aspirin without significant bleeding, bruising. 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  Update 04/16/2015 : She returns for follow-up after last visit in July 2015. She continues to do well without recurrent stroke or TIA symptoms since her stroke in October 2014. She has had no other new interval health problems. She remains on aspirin which is tolerating well without bleeding or bruising. She is tolerating Zocor 40 mg daily also without significant muscle aches or pains. She had last lipid profile checked one week ago which  showed total cholesterol 180, LDL 107, HDL 52 mg percent. She states her blood pressure is usually in the 130s/80s at home and quite well controlled. She has made full recovery from her stroke and has no complaints. 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 She has not had carotid ultrasound done since her stroke in 2014 ROS:  14 system review of systems is positive for no complaints today and all systems negative.  ALLERGIES: No Known Allergies  HOME MEDICATIONS: Outpatient Prescriptions Prior to Visit  Medication Sig Dispense Refill  . aspirin EC 325 MG tablet TAKE 1 TABLET DAILY 90 tablet 0  . levothyroxine (SYNTHROID, LEVOTHROID) 75 MCG tablet TAKE 1 TABLET DAILY 90 tablet 2  . losartan (COZAAR) 100 MG tablet Take 1 tablet (100 mg total) by mouth daily. 30 tablet 5  . simvastatin (ZOCOR) 40 MG tablet 1 PO QD 90 tablet 0   No facility-administered medications prior to visit.    PHYSICAL EXAM Filed Vitals:   04/16/15 0832  BP: 146/92  Pulse: 73  Weight: 155 lb 9.6 oz (70.58 kg)   Body mass index is 28.45 kg/(m^2). No exam data present No flowsheet data found.  No flowsheet data found.   Physical Exam  General: Pleasant middle-age Caucasian lady, seated, in no evident distress  Head: head normocephalic and atraumatic. Orohparynx benign  Neck: supple with no carotid or supraclavicular bruits  Cardiovascular: regular rate and rhythm, no murmurs  Musculoskeletal: no deformity  Skin: no rash/petichiae  Vascular: Normal pulses all extremities   Neurologic Exam  Mental Status: Awake and fully alert. Oriented to place and time. Recent and remote memory intact. Attention span, concentration and fund of knowledge appropriate. Mood  and affect appropriate.  Cranial Nerves: Fundoscopic exam not done Pupils equal, briskly reactive to light. Extraocular movements full without nystagmus. Visual fields full to confrontation. Hearing intact.  Facial sensation intact. Face, tongue, palate moves normally and symmetrically.  Motor: Normal bulk and tone. Normal strength in all tested extremity muscles. Diminished fine finger movements on the left.   Sensory: intact to touch and pinprick and vibratory sensation.  Coordination: Rapid alternating movements normal in all extremities. Finger-to-nose and heel-to-shin performed accurately bilaterally.  Gait and Station: Arises from chair without difficulty. Stance is normal. Gait demonstrates normal stride length and balance. Able to heel, toe and tandem walk without difficulty.  Reflexes: 1+ and symmetric. Toes downgoing.   ASSESSMENT: 54 year-old Caucasian female with right subcortical and thalamic infarct from small vessel disease in October 2014 with vascular risk factors of hypertension only. She has made a full functional recovery and continues to do very well.  PLAN:  I had a long d/w patient about her remote stroke, risk for recurrent stroke/TIAs, personally independently reviewed imaging studies and stroke evaluation results and answered questions.Continue aspirin 325 mg orally every day  for secondary stroke prevention and maintain strict control of hypertension with blood pressure goal below 130/90, diabetes with hemoglobin A1c goal below 6.5% and lipids with LDL cholesterol goal below 100 mg/dL. I also advised the patient to eat a healthy diet with plenty of whole grains, cereals, fruits and vegetables, exercise regularly and maintain ideal body weight. Check screening carotid dopplers as they have not been checked since her stroke. No routine followup in the future with me is necessary and she can follow-up with her primary physician. She may be referred back as necessary in the future.  Delia Heady, MD  04/16/2015, 8:59 AM [facility] [address], [address] [place], [place] [address] [phone]  Note: This document was prepared with digital dictation and possible  smart phrase technology. Any transcriptional errors that result from this process are unintentional.

## 2015-04-23 ENCOUNTER — Ambulatory Visit (INDEPENDENT_AMBULATORY_CARE_PROVIDER_SITE_OTHER): Payer: BLUE CROSS/BLUE SHIELD

## 2015-04-23 DIAGNOSIS — M810 Age-related osteoporosis without current pathological fracture: Secondary | ICD-10-CM

## 2015-05-04 ENCOUNTER — Other Ambulatory Visit: Payer: Self-pay | Admitting: Nurse Practitioner

## 2015-05-05 ENCOUNTER — Other Ambulatory Visit: Payer: Self-pay | Admitting: Nurse Practitioner

## 2015-05-08 ENCOUNTER — Telehealth: Payer: Self-pay | Admitting: Radiology

## 2015-05-08 NOTE — Telephone Encounter (Signed)
Called home phone and left v/mess to call for scheduling the carotid doppler.

## 2015-06-04 ENCOUNTER — Ambulatory Visit (INDEPENDENT_AMBULATORY_CARE_PROVIDER_SITE_OTHER): Payer: BLUE CROSS/BLUE SHIELD

## 2015-06-04 DIAGNOSIS — I639 Cerebral infarction, unspecified: Secondary | ICD-10-CM

## 2015-06-04 DIAGNOSIS — I6381 Other cerebral infarction due to occlusion or stenosis of small artery: Secondary | ICD-10-CM

## 2015-06-07 ENCOUNTER — Other Ambulatory Visit: Payer: Self-pay | Admitting: Nurse Practitioner

## 2015-07-22 ENCOUNTER — Ambulatory Visit (INDEPENDENT_AMBULATORY_CARE_PROVIDER_SITE_OTHER): Payer: BLUE CROSS/BLUE SHIELD

## 2015-07-22 ENCOUNTER — Ambulatory Visit (INDEPENDENT_AMBULATORY_CARE_PROVIDER_SITE_OTHER): Payer: BLUE CROSS/BLUE SHIELD | Admitting: Nurse Practitioner

## 2015-07-22 ENCOUNTER — Encounter: Payer: Self-pay | Admitting: Nurse Practitioner

## 2015-07-22 ENCOUNTER — Telehealth: Payer: Self-pay | Admitting: Nurse Practitioner

## 2015-07-22 VITALS — BP 132/86 | HR 68 | Temp 96.9°F | Ht 62.0 in | Wt 155.4 lb

## 2015-07-22 DIAGNOSIS — E039 Hypothyroidism, unspecified: Secondary | ICD-10-CM

## 2015-07-22 DIAGNOSIS — I10 Essential (primary) hypertension: Secondary | ICD-10-CM | POA: Diagnosis not present

## 2015-07-22 DIAGNOSIS — E785 Hyperlipidemia, unspecified: Secondary | ICD-10-CM

## 2015-07-22 DIAGNOSIS — Z6828 Body mass index (BMI) 28.0-28.9, adult: Secondary | ICD-10-CM | POA: Diagnosis not present

## 2015-07-22 MED ORDER — LOSARTAN POTASSIUM 100 MG PO TABS: 100.0000 mg | ORAL_TABLET | Freq: Every day | ORAL | Status: DC

## 2015-07-22 MED ORDER — LEVOTHYROXINE SODIUM 75 MCG PO TABS: 75.0000 ug | ORAL_TABLET | Freq: Every day | ORAL | Status: DC

## 2015-07-22 MED ORDER — SIMVASTATIN 40 MG PO TABS: 40.0000 mg | ORAL_TABLET | Freq: Every day | ORAL | Status: DC

## 2015-07-22 NOTE — Telephone Encounter (Signed)
Patient aware of ekg and xray results.

## 2015-07-22 NOTE — Patient Instructions (Signed)
Fat and Cholesterol Control Diet Fat and cholesterol levels in your blood and organs are influenced by your diet. High levels of fat and cholesterol may lead to diseases of the heart, small and large blood vessels, gallbladder, liver, and pancreas. CONTROLLING FAT AND CHOLESTEROL WITH DIET Although exercise and lifestyle factors are important, your diet is key. That is because certain foods are known to raise cholesterol and others to lower it. The goal is to balance foods for their effect on cholesterol and more importantly, to replace saturated and trans fat with other types of fat, such as monounsaturated fat, polyunsaturated fat, and omega-3 fatty acids. On average, a person should consume no more than 15 to 17 g of saturated fat daily. Saturated and trans fats are considered "bad" fats, and they will raise LDL cholesterol. Saturated fats are primarily found in animal products such as meats, butter, and cream. However, that does not mean you need to give up all your favorite foods. Today, there are good tasting, low-fat, low-cholesterol substitutes for most of the things you like to eat. Choose low-fat or nonfat alternatives. Choose round or loin cuts of red meat. These types of cuts are lowest in fat and cholesterol. Chicken (without the skin), fish, veal, and ground turkey breast are great choices. Eliminate fatty meats, such as hot dogs and salami. Even shellfish have little or no saturated fat. Have a 3 oz (85 g) portion when you eat lean meat, poultry, or fish. Trans fats are also called "partially hydrogenated oils." They are oils that have been scientifically manipulated so that they are solid at room temperature resulting in a longer shelf life and improved taste and texture of foods in which they are added. Trans fats are found in stick margarine, some tub margarines, cookies, crackers, and baked goods.  When baking and cooking, oils are a great substitute for butter. The monounsaturated oils are  especially beneficial since it is believed they lower LDL and raise HDL. The oils you should avoid entirely are saturated tropical oils, such as coconut and palm.  Remember to eat a lot from food groups that are naturally free of saturated and trans fat, including fish, fruit, vegetables, beans, grains (barley, rice, couscous, bulgur wheat), and pasta (without cream sauces).  IDENTIFYING FOODS THAT LOWER FAT AND CHOLESTEROL  Soluble fiber may lower your cholesterol. This type of fiber is found in fruits such as apples, vegetables such as broccoli, potatoes, and carrots, legumes such as beans, peas, and lentils, and grains such as barley. Foods fortified with plant sterols (phytosterol) may also lower cholesterol. You should eat at least 2 g per day of these foods for a cholesterol lowering effect.  Read package labels to identify low-saturated fats, trans fat free, and low-fat foods at the supermarket. Select cheeses that have only 2 to 3 g saturated fat per ounce. Use a heart-healthy tub margarine that is free of trans fats or partially hydrogenated oil. When buying baked goods (cookies, crackers), avoid partially hydrogenated oils. Breads and muffins should be made from whole grains (whole-wheat or whole oat flour, instead of "flour" or "enriched flour"). Buy non-creamy canned soups with reduced salt and no added fats.  FOOD PREPARATION TECHNIQUES  Never deep-fry. If you must fry, either stir-fry, which uses very little fat, or use non-stick cooking sprays. When possible, broil, bake, or roast meats, and steam vegetables. Instead of putting butter or margarine on vegetables, use lemon and herbs, applesauce, and cinnamon (for squash and sweet potatoes). Use nonfat   yogurt, salsa, and low-fat dressings for salads.  LOW-SATURATED FAT / LOW-FAT FOOD SUBSTITUTES Meats / Saturated Fat (g)  Avoid: Steak, marbled (3 oz/85 g) / 11 g  Choose: Steak, lean (3 oz/85 g) / 4 g  Avoid: Hamburger (3 oz/85 g) / 7  g  Choose: Hamburger, lean (3 oz/85 g) / 5 g  Avoid: Ham (3 oz/85 g) / 6 g  Choose: Ham, lean cut (3 oz/85 g) / 2.4 g  Avoid: Chicken, with skin, dark meat (3 oz/85 g) / 4 g  Choose: Chicken, skin removed, dark meat (3 oz/85 g) / 2 g  Avoid: Chicken, with skin, light meat (3 oz/85 g) / 2.5 g  Choose: Chicken, skin removed, light meat (3 oz/85 g) / 1 g Dairy / Saturated Fat (g)  Avoid: Whole milk (1 cup) / 5 g  Choose: Low-fat milk, 2% (1 cup) / 3 g  Choose: Low-fat milk, 1% (1 cup) / 1.5 g  Choose: Skim milk (1 cup) / 0.3 g  Avoid: Hard cheese (1 oz/28 g) / 6 g  Choose: Skim milk cheese (1 oz/28 g) / 2 to 3 g  Avoid: Cottage cheese, 4% fat (1 cup) / 6.5 g  Choose: Low-fat cottage cheese, 1% fat (1 cup) / 1.5 g  Avoid: Ice cream (1 cup) / 9 g  Choose: Sherbet (1 cup) / 2.5 g  Choose: Nonfat frozen yogurt (1 cup) / 0.3 g  Choose: Frozen fruit bar / trace  Avoid: Whipped cream (1 tbs) / 3.5 g  Choose: Nondairy whipped topping (1 tbs) / 1 g Condiments / Saturated Fat (g)  Avoid: Mayonnaise (1 tbs) / 2 g  Choose: Low-fat mayonnaise (1 tbs) / 1 g  Avoid: Butter (1 tbs) / 7 g  Choose: Extra light margarine (1 tbs) / 1 g  Avoid: Coconut oil (1 tbs) / 11.8 g  Choose: Olive oil (1 tbs) / 1.8 g  Choose: Corn oil (1 tbs) / 1.7 g  Choose: Safflower oil (1 tbs) / 1.2 g  Choose: Sunflower oil (1 tbs) / 1.4 g  Choose: Soybean oil (1 tbs) / 2.4 g  Choose: Canola oil (1 tbs) / 1 g Document Released: 12/07/2005 Document Revised: 04/03/2013 Document Reviewed: 03/07/2014 [employer] Patient Information 2015 [employer]. This information is not intended to replace advice given to you by your health care provider. Make sure you discuss any questions you have with your health care provider.  

## 2015-07-22 NOTE — Progress Notes (Signed)
Subjective:    Patient ID: Brooke Newton, female    DOB: Jul 12, 1961, 54 y.o.   MRN: [id number]  Patient here today for follow up of chronic medical problems. Pt reports BP at home is 130/70's range and her BP is always on the high side when she's at the doctor's office.   Hypertension This is a chronic problem. The current episode started more than 1 year ago. The problem is uncontrolled. Pertinent negatives include no chest pain, headaches, neck pain, palpitations or shortness of breath. Risk factors for coronary artery disease include dyslipidemia. Past treatments include angiotensin blockers. The current treatment provides moderate improvement. Compliance problems include exercise.  Hypertensive end-organ damage includes CVA (794801) and a thyroid problem.  Hyperlipidemia This is a chronic problem. The current episode started more than 1 year ago. The problem is uncontrolled. Recent lipid tests were reviewed and are high. Exacerbating diseases include hypothyroidism and obesity. She has no history of diabetes. Pertinent negatives include no chest pain or shortness of breath. Current antihyperlipidemic treatment includes statins (has not been her zocor- thought was making legs weak but ahas been off for 3 weeks with no change.). The current treatment provides mild improvement of lipids. Compliance problems include adherence to diet and adherence to exercise.  Risk factors for coronary artery disease include dyslipidemia, hypertension, obesity and post-menopausal.  Thyroid Problem Visit type: hypothyroidism. Patient reports no cold intolerance, constipation, diaphoresis, diarrhea, heat intolerance, menstrual problem, palpitations or visual change. Her past medical history is significant for hyperlipidemia. There is no history of diabetes.  CVA-  Light stroke- No residual effects- doing well-   Review of Systems  Constitutional: Negative for diaphoresis.  Respiratory: Negative for shortness of  breath.   Cardiovascular: Negative for chest pain and palpitations.  Gastrointestinal: Negative for diarrhea and constipation.  Endocrine: Negative for cold intolerance and heat intolerance.  Genitourinary: Negative for menstrual problem.  Musculoskeletal: Negative for neck pain.  Neurological: Negative for headaches.       Objective:   Physical Exam  Constitutional: She is oriented to person, place, and time. She appears well-developed and well-nourished. No distress.  HENT:  Head: Normocephalic.  Right Ear: External ear normal.  Nose: Nose normal.  Mouth/Throat: Oropharynx is clear and moist.  Eyes: EOM are normal.  Neck: Trachea normal, normal range of motion and full passive range of motion without pain. Neck supple. No JVD present. Carotid bruit is not present. No thyromegaly present.  Cardiovascular: Normal rate, regular rhythm, normal heart sounds and intact distal pulses.  Exam reveals no gallop and no friction rub.   No murmur heard. Pulmonary/Chest: Effort normal and breath sounds normal.  Abdominal: Soft. Bowel sounds are normal. She exhibits no distension and no mass. There is no tenderness.  Musculoskeletal: Normal range of motion.  Lymphadenopathy:    She has no cervical adenopathy.  Neurological: She is alert and oriented to person, place, and time. She has normal reflexes.  Skin: Skin is warm and dry.  Psychiatric: She has a normal mood and affect. Her behavior is normal. Judgment and thought content normal.   BP 132/86 mmHg  Pulse 68  Temp(Src) 96.9 F (36.1 C)  Ht '5\' 2"'  (1.575 m)  Wt 155 lb 6.4 oz (70.489 kg)  BMI 28.42 kg/m2 EKG- NSR-Mary-Margaret Hassell Done, FNP Chest x ray- no cardiopulmonary abnormalities-Preliminary reading by Ronnald Collum, FNP  [facility]       Assessment & Plan:  1. Essential hypertension, benign Do not add salt to diet - losartan (COZAAR)  100 MG tablet; Take 1 tablet (100 mg total) by mouth daily.  Dispense: 30 tablet; Refill: 5  2.  Hypothyroidism, unspecified hypothyroidism type - levothyroxine (SYNTHROID, LEVOTHROID) 75 MCG tablet; Take 1 tablet (75 mcg total) by mouth daily.  Dispense: 90 tablet; Refill: 1  3. Hyperlipidemia with target LDL less than 100 Low fat diet - simvastatin (ZOCOR) 40 MG tablet; Take 1 tablet (40 mg total) by mouth daily.  Dispense: 90 tablet; Refill: 1  4. BMI 28.0-28.9,adult Discussed diet and exercise for person with BMI >25 Will recheck weight in 3-6 months  Orders Placed This Encounter  Procedures  . CMP14+EGFR  . Lipid panel   Labs pending Health maintenance reviewed Diet and exercise encouraged Continue all meds Follow up  In 3 months   [place], FNP

## 2015-07-23 LAB — CMP14+EGFR
ALK PHOS: 98 IU/L (ref 39–117)
ALT: 13 IU/L (ref 0–32)
AST: 17 IU/L (ref 0–40)
Albumin/Globulin Ratio: 1.6 (ref 1.1–2.5)
Albumin: 4.4 g/dL (ref 3.5–5.5)
BUN/Creatinine Ratio: 14 (ref 9–23)
BUN: 10 mg/dL (ref 6–24)
Bilirubin Total: 0.5 mg/dL (ref 0.0–1.2)
CALCIUM: 9.9 mg/dL (ref 8.7–10.2)
CO2: 27 mmol/L (ref 18–29)
Chloride: 100 mmol/L (ref 97–108)
Creatinine, Ser: 0.74 mg/dL (ref 0.57–1.00)
GFR calc Af Amer: 105 mL/min/{1.73_m2} (ref >59)
GFR calc non Af Amer: 91 mL/min/{1.73_m2} (ref >59)
GLOBULIN, TOTAL: 2.8 g/dL (ref 1.5–4.5)
GLUCOSE: 88 mg/dL (ref 65–99)
POTASSIUM: 4.6 mmol/L (ref 3.5–5.2)
Sodium: 142 mmol/L (ref 134–144)
TOTAL PROTEIN: 7.2 g/dL (ref 6.0–8.5)

## 2015-07-23 LAB — LIPID PANEL
CHOLESTEROL TOTAL: 189 mg/dL (ref 100–199)
Chol/HDL Ratio: 3.4 ratio units (ref 0.0–4.4)
HDL: 55 mg/dL (ref >39)
LDL Calculated: 110 mg/dL — ABNORMAL HIGH (ref 0–99)
Triglycerides: 118 mg/dL (ref 0–149)
VLDL Cholesterol Cal: 24 mg/dL (ref 5–40)

## 2015-11-04 ENCOUNTER — Encounter: Payer: Self-pay | Admitting: Nurse Practitioner

## 2015-11-04 ENCOUNTER — Ambulatory Visit (INDEPENDENT_AMBULATORY_CARE_PROVIDER_SITE_OTHER): Payer: BLUE CROSS/BLUE SHIELD | Admitting: Nurse Practitioner

## 2015-11-04 VITALS — BP 128/78 | HR 74 | Temp 97.4°F | Ht 62.0 in | Wt 164.0 lb

## 2015-11-04 DIAGNOSIS — I10 Essential (primary) hypertension: Secondary | ICD-10-CM

## 2015-11-04 DIAGNOSIS — E039 Hypothyroidism, unspecified: Secondary | ICD-10-CM | POA: Diagnosis not present

## 2015-11-04 DIAGNOSIS — E785 Hyperlipidemia, unspecified: Secondary | ICD-10-CM | POA: Diagnosis not present

## 2015-11-04 DIAGNOSIS — Z1159 Encounter for screening for other viral diseases: Secondary | ICD-10-CM

## 2015-11-04 DIAGNOSIS — Z23 Encounter for immunization: Secondary | ICD-10-CM

## 2015-11-04 DIAGNOSIS — Z9289 Personal history of other medical treatment: Secondary | ICD-10-CM | POA: Diagnosis not present

## 2015-11-04 DIAGNOSIS — Z6830 Body mass index (BMI) 30.0-30.9, adult: Secondary | ICD-10-CM | POA: Diagnosis not present

## 2015-11-04 DIAGNOSIS — I639 Cerebral infarction, unspecified: Secondary | ICD-10-CM

## 2015-11-04 NOTE — Progress Notes (Signed)
Subjective:    Patient ID: Brooke Newton, female    DOB: 1961-12-08, 54 y.o.   MRN: [id number]  Patient here today for follow up of chronic medical problems. Pt reports BP at home is 130/70's range and her BP is always on the high side when she's at the doctor's office.   Hypertension This is a chronic problem. The current episode started more than 1 year ago. The problem is uncontrolled. Pertinent negatives include no chest pain, headaches, neck pain, palpitations or shortness of breath. Risk factors for coronary artery disease include dyslipidemia. Past treatments include angiotensin blockers. The current treatment provides moderate improvement. Compliance problems include exercise.  Hypertensive end-organ damage includes CVA (063016) and a thyroid problem.  Hyperlipidemia This is a chronic problem. The current episode started more than 1 year ago. The problem is uncontrolled. Recent lipid tests were reviewed and are high. Exacerbating diseases include hypothyroidism and obesity. She has no history of diabetes. Pertinent negatives include no chest pain or shortness of breath. Current antihyperlipidemic treatment includes statins (has not been her zocor- thought was making legs weak but ahas been off for 3 weeks with no change.). The current treatment provides mild improvement of lipids. Compliance problems include adherence to diet and adherence to exercise.  Risk factors for coronary artery disease include dyslipidemia, hypertension, obesity and post-menopausal.  Thyroid Problem Visit type: hypothyroidism. Patient reports no cold intolerance, constipation, diaphoresis, diarrhea, heat intolerance, menstrual problem, palpitations or visual change. Her past medical history is significant for hyperlipidemia. There is no history of diabetes.  CVA-  Light stroke- No residual effects- doing well-   Review of Systems  Constitutional: Negative for diaphoresis.  Respiratory: Negative for shortness of  breath.   Cardiovascular: Negative for chest pain and palpitations.  Gastrointestinal: Negative for diarrhea and constipation.  Endocrine: Negative for cold intolerance and heat intolerance.  Genitourinary: Negative for menstrual problem.  Musculoskeletal: Negative for neck pain.  Neurological: Negative for headaches.       Objective:   Physical Exam  Constitutional: She is oriented to person, place, and time. She appears well-developed and well-nourished. No distress.  HENT:  Head: Normocephalic.  Right Ear: External ear normal.  Nose: Nose normal.  Mouth/Throat: Oropharynx is clear and moist.  Eyes: EOM are normal.  Neck: Trachea normal, normal range of motion and full passive range of motion without pain. Neck supple. No JVD present. Carotid bruit is not present. No thyromegaly present.  Cardiovascular: Normal rate, regular rhythm, normal heart sounds and intact distal pulses.  Exam reveals no gallop and no friction rub.   No murmur heard. Pulmonary/Chest: Effort normal and breath sounds normal.  Abdominal: Soft. Bowel sounds are normal. She exhibits no distension and no mass. There is no tenderness.  Musculoskeletal: Normal range of motion.  Lymphadenopathy:    She has no cervical adenopathy.  Neurological: She is alert and oriented to person, place, and time. She has normal reflexes.  Skin: Skin is warm and dry.  Psychiatric: She has a normal mood and affect. Her behavior is normal. Judgment and thought content normal.   BP 128/78 mmHg  Pulse 74  Temp(Src) 97.4 F (36.3 C) (Oral)  Ht '5\' 2"'  (1.575 m)  Wt 164 lb (74.39 kg)  BMI 29.99 kg/m2    Assessment & Plan:  1. Essential hypertension, benign Do not add salt to diet - CMP14+EGFR  2. Acute CVA (cerebrovascular accident) ([place])  3. Hypothyroidism, unspecified hypothyroidism type - Thyroid Panel With TSH  4. Hyperlipidemia with  target LDL less than 100 Low fat diet - Lipid panel  5. BMI  30.0-30.9,adult Discussed diet and exercise for person with BMI >25 Will recheck weight in 3-6 months    Counseling done on all immunizations given today Labs pending Health maintenance reviewed Diet and exercise encouraged Continue all meds Follow up  In 3 months   [place], FNP

## 2015-11-04 NOTE — Patient Instructions (Signed)
Health Maintenance, Female Adopting a healthy lifestyle and getting preventive care can go a long way to promote health and wellness. Talk with your health care provider about what schedule of regular examinations is right for you. This is a good chance for you to check in with your provider about disease prevention and staying healthy. In between checkups, there are plenty of things you can do on your own. Experts have done a lot of research about which lifestyle changes and preventive measures are most likely to keep you healthy. Ask your health care provider for more information. WEIGHT AND DIET  Eat a healthy diet  Be sure to include plenty of vegetables, fruits, low-fat dairy products, and lean protein.  Do not eat a lot of foods high in solid fats, added sugars, or salt.  Get regular exercise. This is one of the most important things you can do for your health.  Most adults should exercise for at least 150 minutes each week. The exercise should increase your heart rate and make you sweat (moderate-intensity exercise).  Most adults should also do strengthening exercises at least twice a week. This is in addition to the moderate-intensity exercise.  Maintain a healthy weight  Body mass index (BMI) is a measurement that can be used to identify possible weight problems. It estimates body fat based on height and weight. Your health care provider can help determine your BMI and help you achieve or maintain a healthy weight.  For females 20 years of age and older:   A BMI below 18.5 is considered underweight.  A BMI of 18.5 to 24.9 is normal.  A BMI of 25 to 29.9 is considered overweight.  A BMI of 30 and above is considered obese.  Watch levels of cholesterol and blood lipids  You should start having your blood tested for lipids and cholesterol at 56 years of age, then have this test every 5 years.  You may need to have your cholesterol levels checked more often if:  Your lipid  or cholesterol levels are high.  You are older than 56 years of age.  You are at high risk for heart disease.  CANCER SCREENING   Lung Cancer  Lung cancer screening is recommended for adults 55-80 years old who are at high risk for lung cancer because of a history of smoking.  A yearly low-dose CT scan of the lungs is recommended for people who:  Currently smoke.  Have quit within the past 15 years.  Have at least a 30-pack-year history of smoking. A pack year is smoking an average of one pack of cigarettes a day for 1 year.  Yearly screening should continue until it has been 15 years since you quit.  Yearly screening should stop if you develop a health problem that would prevent you from having lung cancer treatment.  Breast Cancer  Practice breast self-awareness. This means understanding how your breasts normally appear and feel.  It also means doing regular breast self-exams. Let your health care provider know about any changes, no matter how small.  If you are in your 20s or 30s, you should have a clinical breast exam (CBE) by a health care provider every 1-3 years as part of a regular health exam.  If you are 40 or older, have a CBE every year. Also consider having a breast X-ray (mammogram) every year.  If you have a family history of breast cancer, talk to your health care provider about genetic screening.  If you   are at high risk for breast cancer, talk to your health care provider about having an MRI and a mammogram every year.  Breast cancer gene (BRCA) assessment is recommended for women who have family members with BRCA-related cancers. BRCA-related cancers include:  Breast.  Ovarian.  Tubal.  Peritoneal cancers.  Results of the assessment will determine the need for genetic counseling and BRCA1 and BRCA2 testing. Cervical Cancer Your health care provider may recommend that you be screened regularly for cancer of the pelvic organs (ovaries, uterus, and  vagina). This screening involves a pelvic examination, including checking for microscopic changes to the surface of your cervix (Pap test). You may be encouraged to have this screening done every 3 years, beginning at age 21.  For women ages 30-65, health care providers may recommend pelvic exams and Pap testing every 3 years, or they may recommend the Pap and pelvic exam, combined with testing for human papilloma virus (HPV), every 5 years. Some types of HPV increase your risk of cervical cancer. Testing for HPV may also be done on women of any age with unclear Pap test results.  Other health care providers may not recommend any screening for nonpregnant women who are considered low risk for pelvic cancer and who do not have symptoms. Ask your health care provider if a screening pelvic exam is right for you.  If you have had past treatment for cervical cancer or a condition that could lead to cancer, you need Pap tests and screening for cancer for at least 20 years after your treatment. If Pap tests have been discontinued, your risk factors (such as having a new sexual partner) need to be reassessed to determine if screening should resume. Some women have medical problems that increase the chance of getting cervical cancer. In these cases, your health care provider may recommend more frequent screening and Pap tests. Colorectal Cancer  This type of cancer can be detected and often prevented.  Routine colorectal cancer screening usually begins at 56 years of age and continues through 56 years of age.  Your health care provider may recommend screening at an earlier age if you have risk factors for colon cancer.  Your health care provider may also recommend using home test kits to check for hidden blood in the stool.  A small camera at the end of a tube can be used to examine your colon directly (sigmoidoscopy or colonoscopy). This is done to check for the earliest forms of colorectal  cancer.  Routine screening usually begins at age 50.  Direct examination of the colon should be repeated every 5-10 years through 56 years of age. However, you may need to be screened more often if early forms of precancerous polyps or small growths are found. Skin Cancer  Check your skin from head to toe regularly.  Tell your health care provider about any new moles or changes in moles, especially if there is a change in a mole's shape or color.  Also tell your health care provider if you have a mole that is larger than the size of a pencil eraser.  Always use sunscreen. Apply sunscreen liberally and repeatedly throughout the day.  Protect yourself by wearing long sleeves, pants, a wide-brimmed hat, and sunglasses whenever you are outside. HEART DISEASE, DIABETES, AND HIGH BLOOD PRESSURE   High blood pressure causes heart disease and increases the risk of stroke. High blood pressure is more likely to develop in:  People who have blood pressure in the high end   of the normal range (130-139/85-89 mm Hg).  People who are overweight or obese.  People who are African American.  If you are 38-23 years of age, have your blood pressure checked every 3-5 years. If you are 61 years of age or older, have your blood pressure checked every year. You should have your blood pressure measured twice--once when you are at a hospital or clinic, and once when you are not at a hospital or clinic. Record the average of the two measurements. To check your blood pressure when you are not at a hospital or clinic, you can use:  An automated blood pressure machine at a pharmacy.  A home blood pressure monitor.  If you are between 45 years and 39 years old, ask your health care provider if you should take aspirin to prevent strokes.  Have regular diabetes screenings. This involves taking a blood sample to check your fasting blood sugar level.  If you are at a normal weight and have a low risk for diabetes,  have this test once every three years after 56 years of age.  If you are overweight and have a high risk for diabetes, consider being tested at a younger age or more often. PREVENTING INFECTION  Hepatitis B  If you have a higher risk for hepatitis B, you should be screened for this virus. You are considered at high risk for hepatitis B if:  You were born in a country where hepatitis B is common. Ask your health care provider which countries are considered high risk.  Your parents were born in a high-risk country, and you have not been immunized against hepatitis B (hepatitis B vaccine).  You have HIV or AIDS.  You use needles to inject street drugs.  You live with someone who has hepatitis B.  You have had sex with someone who has hepatitis B.  You get hemodialysis treatment.  You take certain medicines for conditions, including cancer, organ transplantation, and autoimmune conditions. Hepatitis C  Blood testing is recommended for:  Everyone born from [birth year] through 1967.  Anyone with known risk factors for hepatitis C. Sexually transmitted infections (STIs)  You should be screened for sexually transmitted infections (STIs) including gonorrhea and chlamydia if:  You are sexually active and are younger than 56 years of age.  You are older than 56 years of age and your health care provider tells you that you are at risk for this type of infection.  Your sexual activity has changed since you were last screened and you are at an increased risk for chlamydia or gonorrhea. Ask your health care provider if you are at risk.  If you do not have HIV, but are at risk, it may be recommended that you take a prescription medicine daily to prevent HIV infection. This is called pre-exposure prophylaxis (PrEP). You are considered at risk if:  You are sexually active and do not regularly use condoms or know the HIV status of your partner(s).  You take drugs by injection.  You are sexually  active with a partner who has HIV. Talk with your health care provider about whether you are at high risk of being infected with HIV. If you choose to begin PrEP, you should first be tested for HIV. You should then be tested every 3 months for as long as you are taking PrEP.  PREGNANCY   If you are premenopausal and you may become pregnant, ask your health care provider about preconception counseling.  If you may  become pregnant, take 400 to 800 micrograms (mcg) of folic acid every day.  If you want to prevent pregnancy, talk to your health care provider about birth control (contraception). OSTEOPOROSIS AND MENOPAUSE   Osteoporosis is a disease in which the bones lose minerals and strength with aging. This can result in serious bone fractures. Your risk for osteoporosis can be identified using a bone density scan.  If you are 61 years of age or older, or if you are at risk for osteoporosis and fractures, ask your health care provider if you should be screened.  Ask your health care provider whether you should take a calcium or vitamin D supplement to lower your risk for osteoporosis.  Menopause may have certain physical symptoms and risks.  Hormone replacement therapy may reduce some of these symptoms and risks. Talk to your health care provider about whether hormone replacement therapy is right for you.  HOME CARE INSTRUCTIONS   Schedule regular health, dental, and eye exams.  Stay current with your immunizations.   Do not use any tobacco products including cigarettes, chewing tobacco, or electronic cigarettes.  If you are pregnant, do not drink alcohol.  If you are breastfeeding, limit how much and how often you drink alcohol.  Limit alcohol intake to no more than 1 drink per day for nonpregnant women. One drink equals 12 ounces of beer, 5 ounces of wine, or 1 ounces of hard liquor.  Do not use street drugs.  Do not share needles.  Ask your health care provider for help if  you need support or information about quitting drugs.  Tell your health care provider if you often feel depressed.  Tell your health care provider if you have ever been abused or do not feel safe at home.   This information is not intended to replace advice given to you by your health care provider. Make sure you discuss any questions you have with your health care provider.   Document Released: 06/22/2011 Document Revised: 12/28/2014 Document Reviewed: 11/08/2013 Elsevier Interactive Patient Education [employer].

## 2015-11-05 LAB — LIPID PANEL
Chol/HDL Ratio: 4 ratio units (ref 0.0–4.4)
Cholesterol, Total: 206 mg/dL — ABNORMAL HIGH (ref 100–199)
HDL: 51 mg/dL (ref >39)
LDL Calculated: 127 mg/dL — ABNORMAL HIGH (ref 0–99)
TRIGLYCERIDES: 142 mg/dL (ref 0–149)
VLDL Cholesterol Cal: 28 mg/dL (ref 5–40)

## 2015-11-05 LAB — THYROID PANEL WITH TSH
Free Thyroxine Index: 3.2 (ref 1.2–4.9)
T3 UPTAKE RATIO: 26 % (ref 24–39)
T4, Total: 12.3 ug/dL — ABNORMAL HIGH (ref 4.5–12.0)
TSH: 2.89 u[IU]/mL (ref 0.450–4.500)

## 2015-11-05 LAB — CMP14+EGFR
ALK PHOS: 95 IU/L (ref 39–117)
ALT: 17 IU/L (ref 0–32)
AST: 18 IU/L (ref 0–40)
Albumin/Globulin Ratio: 1.4 (ref 1.1–2.5)
Albumin: 4.3 g/dL (ref 3.5–5.5)
BUN/Creatinine Ratio: 14 (ref 9–23)
BUN: 10 mg/dL (ref 6–24)
Bilirubin Total: 0.3 mg/dL (ref 0.0–1.2)
CALCIUM: 9.6 mg/dL (ref 8.7–10.2)
CHLORIDE: 99 mmol/L (ref 97–106)
CO2: 26 mmol/L (ref 18–29)
Creatinine, Ser: 0.71 mg/dL (ref 0.57–1.00)
GFR calc Af Amer: 110 mL/min/{1.73_m2} (ref >59)
GFR, EST NON AFRICAN AMERICAN: 96 mL/min/{1.73_m2} (ref >59)
GLOBULIN, TOTAL: 3 g/dL (ref 1.5–4.5)
Glucose: 91 mg/dL (ref 65–99)
Potassium: 4.7 mmol/L (ref 3.5–5.2)
Sodium: 139 mmol/L (ref 136–144)
Total Protein: 7.3 g/dL (ref 6.0–8.5)

## 2015-11-05 LAB — HEPATITIS C ANTIBODY

## 2015-11-06 NOTE — Progress Notes (Signed)
Patient aware.

## 2016-01-20 ENCOUNTER — Other Ambulatory Visit: Payer: Self-pay | Admitting: Nurse Practitioner

## 2016-01-21 ENCOUNTER — Other Ambulatory Visit: Payer: Self-pay | Admitting: Nurse Practitioner

## 2016-01-21 DIAGNOSIS — Z1231 Encounter for screening mammogram for malignant neoplasm of breast: Secondary | ICD-10-CM

## 2016-02-09 ENCOUNTER — Other Ambulatory Visit: Payer: Self-pay | Admitting: Nurse Practitioner

## 2016-02-11 ENCOUNTER — Ambulatory Visit (INDEPENDENT_AMBULATORY_CARE_PROVIDER_SITE_OTHER): Payer: BLUE CROSS/BLUE SHIELD | Admitting: Nurse Practitioner

## 2016-02-11 ENCOUNTER — Encounter: Payer: Self-pay | Admitting: Nurse Practitioner

## 2016-02-11 VITALS — BP 148/88 | HR 74 | Temp 97.0°F | Ht 62.0 in | Wt 168.6 lb

## 2016-02-11 DIAGNOSIS — E039 Hypothyroidism, unspecified: Secondary | ICD-10-CM

## 2016-02-11 DIAGNOSIS — Z6830 Body mass index (BMI) 30.0-30.9, adult: Secondary | ICD-10-CM | POA: Diagnosis not present

## 2016-02-11 DIAGNOSIS — I10 Essential (primary) hypertension: Secondary | ICD-10-CM

## 2016-02-11 DIAGNOSIS — E785 Hyperlipidemia, unspecified: Secondary | ICD-10-CM | POA: Diagnosis not present

## 2016-02-11 DIAGNOSIS — I639 Cerebral infarction, unspecified: Secondary | ICD-10-CM | POA: Diagnosis not present

## 2016-02-11 MED ORDER — LOSARTAN POTASSIUM 100 MG PO TABS: 100.0000 mg | ORAL_TABLET | Freq: Every day | ORAL | Status: DC

## 2016-02-11 MED ORDER — SIMVASTATIN 40 MG PO TABS: 40.0000 mg | ORAL_TABLET | Freq: Every day | ORAL | Status: DC

## 2016-02-11 MED ORDER — LEVOTHYROXINE SODIUM 75 MCG PO TABS: 75.0000 ug | ORAL_TABLET | Freq: Every day | ORAL | Status: DC

## 2016-02-11 NOTE — Progress Notes (Signed)
Subjective:    Patient ID: Brooke Newton, female    DOB: August 12, 1961, 55 y.o.   MRN: [id number]  Patient here today for follow up of chronic medical problems.  Outpatient Encounter Prescriptions as of 02/11/2016  Medication Sig  . levothyroxine (SYNTHROID, LEVOTHROID) 75 MCG tablet TAKE 1 TABLET DAILY  . losartan (COZAAR) 100 MG tablet Take 1 tablet (100 mg total) by mouth daily.  . simvastatin (ZOCOR) 40 MG tablet TAKE 1 TABLET DAILY  . [DISCONTINUED] aspirin EC 325 MG tablet TAKE 1 TABLET DAILY   No facility-administered encounter medications on file as of 02/11/2016.     Hyperlipidemia This is a chronic problem. The current episode started more than 1 year ago. The problem is uncontrolled. Recent lipid tests were reviewed and are high. Exacerbating diseases include hypothyroidism and obesity. She has no history of diabetes. Pertinent negatives include no chest pain or shortness of breath. Current antihyperlipidemic treatment includes statins (has not been her zocor- thought was making legs weak but ahas been off for 3 weeks with no change.). The current treatment provides mild improvement of lipids. Compliance problems include adherence to diet and adherence to exercise.  Risk factors for coronary artery disease include dyslipidemia, hypertension, obesity and post-menopausal.  Hypertension This is a chronic problem. The current episode started more than 1 year ago. The problem is uncontrolled. Pertinent negatives include no chest pain, headaches, neck pain, palpitations or shortness of breath. Risk factors for coronary artery disease include dyslipidemia. Past treatments include angiotensin blockers. The current treatment provides moderate improvement. Compliance problems include exercise.  Hypertensive end-organ damage includes CVA ([id number]) and a thyroid problem.  Thyroid Problem Visit type: hypothyroidism. Patient reports no cold intolerance, constipation, diaphoresis, diarrhea, heat  intolerance, menstrual problem, palpitations or visual change. Her past medical history is significant for hyperlipidemia. There is no history of diabetes.  CVA-  Light stroke- No residual effects- doing well-   Review of Systems  Constitutional: Negative for diaphoresis.  Respiratory: Negative for shortness of breath.   Cardiovascular: Negative for chest pain and palpitations.  Gastrointestinal: Negative for diarrhea and constipation.  Endocrine: Negative for cold intolerance and heat intolerance.  Genitourinary: Negative for menstrual problem.  Musculoskeletal: Negative for neck pain.  Neurological: Negative for headaches.       Objective:   Physical Exam  Constitutional: She is oriented to person, place, and time. She appears well-developed and well-nourished. No distress.  HENT:  Head: Normocephalic.  Right Ear: External ear normal.  Nose: Nose normal.  Mouth/Throat: Oropharynx is clear and moist.  Eyes: EOM are normal.  Neck: Trachea normal, normal range of motion and full passive range of motion without pain. Neck supple. No JVD present. Carotid bruit is not present. No thyromegaly present.  Cardiovascular: Normal rate, regular rhythm, normal heart sounds and intact distal pulses.  Exam reveals no gallop and no friction rub.   No murmur heard. Pulmonary/Chest: Effort normal and breath sounds normal.  Abdominal: Soft. Bowel sounds are normal. She exhibits no distension and no mass. There is no tenderness.  Musculoskeletal: Normal range of motion.  Lymphadenopathy:    She has no cervical adenopathy.  Neurological: She is alert and oriented to person, place, and time. She has normal reflexes.  Skin: Skin is warm and dry.  Psychiatric: She has a normal mood and affect. Her behavior is normal. Judgment and thought content normal.   BP 148/88 mmHg  Pulse 74  Temp(Src) 97 F (36.1 C) (Oral)  Ht _0  (1.575 m)  Wt 168 lb 9.6 oz (76.476 kg)  BMI 30.83 kg/m2    Assessment  & Plan:  1. Essential hypertension, benign Do not add salt to diet Keep diary of blood pressure at home if stays above 809 systolic let me know - [id number] - losartan (COZAAR) 100 MG tablet; Take 1 tablet (100 mg total) by mouth daily.  Dispense: 90 tablet; Refill: 1  2. Acute CVA (cerebrovascular accident) ([place])  3. Hypothyroidism, unspecified hypothyroidism type - Thyroid Panel With TSH - levothyroxine (SYNTHROID, LEVOTHROID) 75 MCG tablet; Take 1 tablet (75 mcg total) by mouth daily.  Dispense: 90 tablet; Refill: 2  4. Hyperlipidemia with target LDL less than 100 Low fat diet - Lipid panel - simvastatin (ZOCOR) 40 MG tablet; Take 1 tablet (40 mg total) by mouth daily.  Dispense: 90 tablet; Refill: 1  5. BMI 30.0-30.9,adult Discussed diet and exercise for person with BMI >25 Will recheck weight in 3-6 months    Patient to schedule mammogram Labs pending Health maintenance reviewed Diet and exercise encouraged Continue all meds Follow up  In 41month  Mary-Margaret Terran Hollenkamp, [place]

## 2016-02-11 NOTE — Patient Instructions (Signed)
Hypertension Hypertension, commonly called high blood pressure, is when the force of blood pumping through your arteries is too strong. Your arteries are the blood vessels that carry blood from your heart throughout your body. A blood pressure reading consists of a higher number over a lower number, such as 110/72. The higher number (systolic) is the pressure inside your arteries when your heart pumps. The lower number (diastolic) is the pressure inside your arteries when your heart relaxes. Ideally you want your blood pressure below 120/80. Hypertension forces your heart to work harder to pump blood. Your arteries may become narrow or stiff. Having untreated or uncontrolled hypertension can cause heart attack, stroke, kidney disease, and other problems. RISK FACTORS Some risk factors for high blood pressure are controllable. Others are not.  Risk factors you cannot control include:   Race. You may be at higher risk if you are African American.  Age. Risk increases with age.  Gender. Men are at higher risk than women before age 45 years. After age 65, women are at higher risk than men. Risk factors you can control include:  Not getting enough exercise or physical activity.  Being overweight.  Getting too much fat, sugar, calories, or salt in your diet.  Drinking too much alcohol. SIGNS AND SYMPTOMS Hypertension does not usually cause signs or symptoms. Extremely high blood pressure (hypertensive crisis) may cause headache, anxiety, shortness of breath, and nosebleed. DIAGNOSIS To check if you have hypertension, your health care provider will measure your blood pressure while you are seated, with your arm held at the level of your heart. It should be measured at least twice using the same arm. Certain conditions can cause a difference in blood pressure between your right and left arms. A blood pressure reading that is higher than normal on one occasion does not mean that you need treatment. If  it is not clear whether you have high blood pressure, you may be asked to return on a different day to have your blood pressure checked again. Or, you may be asked to monitor your blood pressure at home for 1 or more weeks. TREATMENT Treating high blood pressure includes making lifestyle changes and possibly taking medicine. Living a healthy lifestyle can help lower high blood pressure. You may need to change some of your habits. Lifestyle changes may include:  Following the DASH diet. This diet is high in fruits, vegetables, and whole grains. It is low in salt, red meat, and added sugars.  Keep your sodium intake below 2,300 mg per day.  Getting at least 30-45 minutes of aerobic exercise at least 4 times per week.  Losing weight if necessary.  Not smoking.  Limiting alcoholic beverages.  Learning ways to reduce stress. Your health care provider may prescribe medicine if lifestyle changes are not enough to get your blood pressure under control, and if one of the following is true:  You are 18-59 years of age and your systolic blood pressure is above 140.  You are 60 years of age or older, and your systolic blood pressure is above 150.  Your diastolic blood pressure is above 90.  You have diabetes, and your systolic blood pressure is over 140 or your diastolic blood pressure is over 90.  You have kidney disease and your blood pressure is above 140/90.  You have heart disease and your blood pressure is above 140/90. Your personal target blood pressure may vary depending on your medical conditions, your age, and other factors. HOME CARE INSTRUCTIONS    Have your blood pressure rechecked as directed by your health care provider.   Take medicines only as directed by your health care provider. Follow the directions carefully. Blood pressure medicines must be taken as prescribed. The medicine does not work as well when you skip doses. Skipping doses also puts you at risk for  problems.  Do not smoke.   Monitor your blood pressure at home as directed by your health care provider. SEEK MEDICAL CARE IF:   You think you are having a reaction to medicines taken.  You have recurrent headaches or feel dizzy.  You have swelling in your ankles.  You have trouble with your vision. SEEK IMMEDIATE MEDICAL CARE IF:  You develop a severe headache or confusion.  You have unusual weakness, numbness, or feel faint.  You have severe chest or abdominal pain.  You vomit repeatedly.  You have trouble breathing. MAKE SURE YOU:   Understand these instructions.  Will watch your condition.  Will get help right away if you are not doing well or get worse.   This information is not intended to replace advice given to you by your health care provider. Make sure you discuss any questions you have with your health care provider.   Document Released: 12/07/2005 Document Revised: 04/23/2015 Document Reviewed: 09/29/2013 Elsevier Interactive Patient Education 2016 [employer]  

## 2016-02-12 LAB — LIPID PANEL
CHOLESTEROL TOTAL: 192 mg/dL (ref 100–199)
Chol/HDL Ratio: 3.8 ratio units (ref 0.0–4.4)
HDL: 50 mg/dL (ref >39)
LDL Calculated: 127 mg/dL — ABNORMAL HIGH (ref 0–99)
TRIGLYCERIDES: 74 mg/dL (ref 0–149)
VLDL Cholesterol Cal: 15 mg/dL (ref 5–40)

## 2016-02-12 LAB — CMP14+EGFR
ALK PHOS: 97 IU/L (ref 39–117)
ALT: 24 IU/L (ref 0–32)
AST: 27 IU/L (ref 0–40)
Albumin/Globulin Ratio: 1.5 (ref 1.1–2.5)
Albumin: 4.2 g/dL (ref 3.5–5.5)
BILIRUBIN TOTAL: 0.4 mg/dL (ref 0.0–1.2)
BUN / CREAT RATIO: 13 (ref 9–23)
BUN: 11 mg/dL (ref 6–24)
CO2: 19 mmol/L (ref 18–29)
CREATININE: 0.82 mg/dL (ref 0.57–1.00)
Calcium: 9.6 mg/dL (ref 8.7–10.2)
Chloride: 97 mmol/L (ref 96–106)
GFR calc Af Amer: 93 mL/min/{1.73_m2} (ref >59)
GFR calc non Af Amer: 80 mL/min/{1.73_m2} (ref >59)
GLOBULIN, TOTAL: 2.8 g/dL (ref 1.5–4.5)
Glucose: 95 mg/dL (ref 65–99)
Potassium: 4.6 mmol/L (ref 3.5–5.2)
SODIUM: 137 mmol/L (ref 134–144)
Total Protein: 7 g/dL (ref 6.0–8.5)

## 2016-02-12 LAB — THYROID PANEL WITH TSH
FREE THYROXINE INDEX: 3 (ref 1.2–4.9)
T3 UPTAKE RATIO: 27 % (ref 24–39)
T4 TOTAL: 11.1 ug/dL (ref 4.5–12.0)
TSH: 3.22 u[IU]/mL (ref 0.450–4.500)

## 2016-02-17 ENCOUNTER — Telehealth: Payer: Self-pay | Admitting: Nurse Practitioner

## 2016-02-17 DIAGNOSIS — E785 Hyperlipidemia, unspecified: Secondary | ICD-10-CM

## 2016-02-17 DIAGNOSIS — I10 Essential (primary) hypertension: Secondary | ICD-10-CM

## 2016-02-17 DIAGNOSIS — E039 Hypothyroidism, unspecified: Secondary | ICD-10-CM

## 2016-02-17 MED ORDER — SIMVASTATIN 40 MG PO TABS: 40.0000 mg | ORAL_TABLET | Freq: Every day | ORAL | Status: DC

## 2016-02-17 MED ORDER — LEVOTHYROXINE SODIUM 75 MCG PO TABS
75.0000 ug | ORAL_TABLET | Freq: Every day | ORAL | Status: DC
Start: 2016-02-17 — End: 2016-12-29

## 2016-02-17 MED ORDER — LOSARTAN POTASSIUM 100 MG PO TABS: 100.0000 mg | ORAL_TABLET | Freq: Every day | ORAL | Status: DC

## 2016-02-17 NOTE — Telephone Encounter (Signed)
Stp [facility]'s sent on 2/21 and i will send 90 day supply of meds to [facility] and a 30 day supply of Losartan to [facility] in [place].

## 2016-03-17 ENCOUNTER — Ambulatory Visit
Admission: RE | Admit: 2016-03-17 | Discharge: 2016-03-17 | Disposition: A | Discharge status: Home / Self Care | Payer: BLUE CROSS/BLUE SHIELD | Source: Ambulatory Visit | Attending: Nurse Practitioner | Admitting: Nurse Practitioner

## 2016-03-17 DIAGNOSIS — Z1231 Encounter for screening mammogram for malignant neoplasm of breast: Secondary | ICD-10-CM

## 2016-05-12 ENCOUNTER — Ambulatory Visit: Payer: BLUE CROSS/BLUE SHIELD | Admitting: Nurse Practitioner

## 2016-05-13 ENCOUNTER — Encounter: Payer: Self-pay | Admitting: Nurse Practitioner

## 2016-05-25 DIAGNOSIS — H40013 Open angle with borderline findings, low risk, bilateral: Secondary | ICD-10-CM | POA: Diagnosis not present

## 2016-06-01 DIAGNOSIS — H40013 Open angle with borderline findings, low risk, bilateral: Secondary | ICD-10-CM | POA: Diagnosis not present

## 2016-06-29 ENCOUNTER — Encounter: Payer: Self-pay | Admitting: Nurse Practitioner

## 2016-06-29 ENCOUNTER — Ambulatory Visit (INDEPENDENT_AMBULATORY_CARE_PROVIDER_SITE_OTHER): Payer: BLUE CROSS/BLUE SHIELD | Admitting: Nurse Practitioner

## 2016-06-29 VITALS — BP 144/84 | HR 78 | Temp 97.5°F | Ht 62.0 in | Wt 166.0 lb

## 2016-06-29 DIAGNOSIS — E039 Hypothyroidism, unspecified: Secondary | ICD-10-CM | POA: Diagnosis not present

## 2016-06-29 DIAGNOSIS — Z1212 Encounter for screening for malignant neoplasm of rectum: Secondary | ICD-10-CM | POA: Diagnosis not present

## 2016-06-29 DIAGNOSIS — I10 Essential (primary) hypertension: Secondary | ICD-10-CM | POA: Diagnosis not present

## 2016-06-29 DIAGNOSIS — E785 Hyperlipidemia, unspecified: Secondary | ICD-10-CM | POA: Diagnosis not present

## 2016-06-29 DIAGNOSIS — Z8673 Personal history of transient ischemic attack (TIA), and cerebral infarction without residual deficits: Secondary | ICD-10-CM | POA: Diagnosis not present

## 2016-06-29 DIAGNOSIS — Z6830 Body mass index (BMI) 30.0-30.9, adult: Secondary | ICD-10-CM | POA: Diagnosis not present

## 2016-06-29 DIAGNOSIS — I639 Cerebral infarction, unspecified: Secondary | ICD-10-CM

## 2016-06-29 MED ORDER — SIMVASTATIN 40 MG PO TABS: 40.0000 mg | ORAL_TABLET | Freq: Every day | ORAL | Status: DC

## 2016-06-29 MED ORDER — LOSARTAN POTASSIUM 100 MG PO TABS: 100.0000 mg | ORAL_TABLET | Freq: Every day | ORAL | Status: DC

## 2016-06-29 NOTE — Patient Instructions (Signed)
Health Maintenance, Female Adopting a healthy lifestyle and getting preventive care can go a long way to promote health and wellness. Talk with your health care provider about what schedule of regular examinations is right for you. This is a good chance for you to check in with your provider about disease prevention and staying healthy. In between checkups, there are plenty of things you can do on your own. Experts have done a lot of research about which lifestyle changes and preventive measures are most likely to keep you healthy. Ask your health care provider for more information. WEIGHT AND DIET  Eat a healthy diet  Be sure to include plenty of vegetables, fruits, low-fat dairy products, and lean protein.  Do not eat a lot of foods high in solid fats, added sugars, or salt.  Get regular exercise. This is one of the most important things you can do for your health.  Most adults should exercise for at least 150 minutes each week. The exercise should increase your heart rate and make you sweat (moderate-intensity exercise).  Most adults should also do strengthening exercises at least twice a week. This is in addition to the moderate-intensity exercise.  Maintain a healthy weight  Body mass index (BMI) is a measurement that can be used to identify possible weight problems. It estimates body fat based on height and weight. Your health care provider can help determine your BMI and help you achieve or maintain a healthy weight.  For females 20 years of age and older:   A BMI below 18.5 is considered underweight.  A BMI of 18.5 to 24.9 is normal.  A BMI of 25 to 29.9 is considered overweight.  A BMI of 30 and above is considered obese.  Watch levels of cholesterol and blood lipids  You should start having your blood tested for lipids and cholesterol at 57 years of age, then have this test every 5 years.  You may need to have your cholesterol levels checked more often if:  Your lipid  or cholesterol levels are high.  You are older than 57 years of age.  You are at high risk for heart disease.  CANCER SCREENING   Lung Cancer  Lung cancer screening is recommended for adults 55-80 years old who are at high risk for lung cancer because of a history of smoking.  A yearly low-dose CT scan of the lungs is recommended for people who:  Currently smoke.  Have quit within the past 15 years.  Have at least a 30-pack-year history of smoking. A pack year is smoking an average of one pack of cigarettes a day for 1 year.  Yearly screening should continue until it has been 15 years since you quit.  Yearly screening should stop if you develop a health problem that would prevent you from having lung cancer treatment.  Breast Cancer  Practice breast self-awareness. This means understanding how your breasts normally appear and feel.  It also means doing regular breast self-exams. Let your health care provider know about any changes, no matter how small.  If you are in your 20s or 30s, you should have a clinical breast exam (CBE) by a health care provider every 1-3 years as part of a regular health exam.  If you are 40 or older, have a CBE every year. Also consider having a breast X-ray (mammogram) every year.  If you have a family history of breast cancer, talk to your health care provider about genetic screening.  If you   are at high risk for breast cancer, talk to your health care provider about having an MRI and a mammogram every year.  Breast cancer gene (BRCA) assessment is recommended for women who have family members with BRCA-related cancers. BRCA-related cancers include:  Breast.  Ovarian.  Tubal.  Peritoneal cancers.  Results of the assessment will determine the need for genetic counseling and BRCA1 and BRCA2 testing. Cervical Cancer Your health care provider may recommend that you be screened regularly for cancer of the pelvic organs (ovaries, uterus, and  vagina). This screening involves a pelvic examination, including checking for microscopic changes to the surface of your cervix (Pap test). You may be encouraged to have this screening done every 3 years, beginning at age 21.  For women ages 30-65, health care providers may recommend pelvic exams and Pap testing every 3 years, or they may recommend the Pap and pelvic exam, combined with testing for human papilloma virus (HPV), every 5 years. Some types of HPV increase your risk of cervical cancer. Testing for HPV may also be done on women of any age with unclear Pap test results.  Other health care providers may not recommend any screening for nonpregnant women who are considered low risk for pelvic cancer and who do not have symptoms. Ask your health care provider if a screening pelvic exam is right for you.  If you have had past treatment for cervical cancer or a condition that could lead to cancer, you need Pap tests and screening for cancer for at least 20 years after your treatment. If Pap tests have been discontinued, your risk factors (such as having a new sexual partner) need to be reassessed to determine if screening should resume. Some women have medical problems that increase the chance of getting cervical cancer. In these cases, your health care provider may recommend more frequent screening and Pap tests. Colorectal Cancer  This type of cancer can be detected and often prevented.  Routine colorectal cancer screening usually begins at 57 years of age and continues through 57 years of age.  Your health care provider may recommend screening at an earlier age if you have risk factors for colon cancer.  Your health care provider may also recommend using home test kits to check for hidden blood in the stool.  A small camera at the end of a tube can be used to examine your colon directly (sigmoidoscopy or colonoscopy). This is done to check for the earliest forms of colorectal  cancer.  Routine screening usually begins at age 50.  Direct examination of the colon should be repeated every 5-10 years through 57 years of age. However, you may need to be screened more often if early forms of precancerous polyps or small growths are found. Skin Cancer  Check your skin from head to toe regularly.  Tell your health care provider about any new moles or changes in moles, especially if there is a change in a mole's shape or color.  Also tell your health care provider if you have a mole that is larger than the size of a pencil eraser.  Always use sunscreen. Apply sunscreen liberally and repeatedly throughout the day.  Protect yourself by wearing long sleeves, pants, a wide-brimmed hat, and sunglasses whenever you are outside. HEART DISEASE, DIABETES, AND HIGH BLOOD PRESSURE   High blood pressure causes heart disease and increases the risk of stroke. High blood pressure is more likely to develop in:  People who have blood pressure in the high end   of the normal range (130-139/85-89 mm Hg).  People who are overweight or obese.  People who are African American.  If you are 38-23 years of age, have your blood pressure checked every 3-5 years. If you are 61 years of age or older, have your blood pressure checked every year. You should have your blood pressure measured twice--once when you are at a hospital or clinic, and once when you are not at a hospital or clinic. Record the average of the two measurements. To check your blood pressure when you are not at a hospital or clinic, you can use:  An automated blood pressure machine at a pharmacy.  A home blood pressure monitor.  If you are between 45 years and 39 years old, ask your health care provider if you should take aspirin to prevent strokes.  Have regular diabetes screenings. This involves taking a blood sample to check your fasting blood sugar level.  If you are at a normal weight and have a low risk for diabetes,  have this test once every three years after 57 years of age.  If you are overweight and have a high risk for diabetes, consider being tested at a younger age or more often. PREVENTING INFECTION  Hepatitis B  If you have a higher risk for hepatitis B, you should be screened for this virus. You are considered at high risk for hepatitis B if:  You were born in a country where hepatitis B is common. Ask your health care provider which countries are considered high risk.  Your parents were born in a high-risk country, and you have not been immunized against hepatitis B (hepatitis B vaccine).  You have HIV or AIDS.  You use needles to inject street drugs.  You live with someone who has hepatitis B.  You have had sex with someone who has hepatitis B.  You get hemodialysis treatment.  You take certain medicines for conditions, including cancer, organ transplantation, and autoimmune conditions. Hepatitis C  Blood testing is recommended for:  Everyone born from [birth year] through 1967.  Anyone with known risk factors for hepatitis C. Sexually transmitted infections (STIs)  You should be screened for sexually transmitted infections (STIs) including gonorrhea and chlamydia if:  You are sexually active and are younger than 57 years of age.  You are older than 57 years of age and your health care provider tells you that you are at risk for this type of infection.  Your sexual activity has changed since you were last screened and you are at an increased risk for chlamydia or gonorrhea. Ask your health care provider if you are at risk.  If you do not have HIV, but are at risk, it may be recommended that you take a prescription medicine daily to prevent HIV infection. This is called pre-exposure prophylaxis (PrEP). You are considered at risk if:  You are sexually active and do not regularly use condoms or know the HIV status of your partner(s).  You take drugs by injection.  You are sexually  active with a partner who has HIV. Talk with your health care provider about whether you are at high risk of being infected with HIV. If you choose to begin PrEP, you should first be tested for HIV. You should then be tested every 3 months for as long as you are taking PrEP.  PREGNANCY   If you are premenopausal and you may become pregnant, ask your health care provider about preconception counseling.  If you may  become pregnant, take 400 to 800 micrograms (mcg) of folic acid every day.  If you want to prevent pregnancy, talk to your health care provider about birth control (contraception). OSTEOPOROSIS AND MENOPAUSE   Osteoporosis is a disease in which the bones lose minerals and strength with aging. This can result in serious bone fractures. Your risk for osteoporosis can be identified using a bone density scan.  If you are 61 years of age or older, or if you are at risk for osteoporosis and fractures, ask your health care provider if you should be screened.  Ask your health care provider whether you should take a calcium or vitamin D supplement to lower your risk for osteoporosis.  Menopause may have certain physical symptoms and risks.  Hormone replacement therapy may reduce some of these symptoms and risks. Talk to your health care provider about whether hormone replacement therapy is right for you.  HOME CARE INSTRUCTIONS   Schedule regular health, dental, and eye exams.  Stay current with your immunizations.   Do not use any tobacco products including cigarettes, chewing tobacco, or electronic cigarettes.  If you are pregnant, do not drink alcohol.  If you are breastfeeding, limit how much and how often you drink alcohol.  Limit alcohol intake to no more than 1 drink per day for nonpregnant women. One drink equals 12 ounces of beer, 5 ounces of wine, or 1 ounces of hard liquor.  Do not use street drugs.  Do not share needles.  Ask your health care provider for help if  you need support or information about quitting drugs.  Tell your health care provider if you often feel depressed.  Tell your health care provider if you have ever been abused or do not feel safe at home.   This information is not intended to replace advice given to you by your health care provider. Make sure you discuss any questions you have with your health care provider.   Document Released: 06/22/2011 Document Revised: 12/28/2014 Document Reviewed: 11/08/2013 Elsevier Interactive Patient Education [employer].

## 2016-06-29 NOTE — Progress Notes (Signed)
Subjective:    Patient ID: Brooke Newton, female    DOB: Sep 19, 1961, 55 y.o.   MRN: [id number]  Patient here today for follow up of chronic medical problems.  Outpatient Encounter Prescriptions as of 06/29/2016  Medication Sig  . levothyroxine (SYNTHROID, LEVOTHROID) 75 MCG tablet Take 1 tablet (75 mcg total) by mouth daily.  Marland Kitchen losartan (COZAAR) 100 MG tablet Take 1 tablet (100 mg total) by mouth daily.  . simvastatin (ZOCOR) 40 MG tablet Take 1 tablet (40 mg total) by mouth daily.   No facility-administered encounter medications on file as of 06/29/2016.     Hyperlipidemia This is a chronic problem. The current episode started more than 1 year ago. The problem is uncontrolled. Recent lipid tests were reviewed and are high. Exacerbating diseases include hypothyroidism and obesity. She has no history of diabetes. Pertinent negatives include no chest pain or shortness of breath. Current antihyperlipidemic treatment includes statins (has not been her zocor- thought was making legs weak but ahas been off for 3 weeks with no change.). The current treatment provides mild improvement of lipids. Compliance problems include adherence to diet and adherence to exercise.  Risk factors for coronary artery disease include dyslipidemia, hypertension, obesity and post-menopausal.  Hypertension This is a chronic problem. The current episode started more than 1 year ago. The problem is uncontrolled. Pertinent negatives include no chest pain, headaches, neck pain, palpitations or shortness of breath. Risk factors for coronary artery disease include dyslipidemia. Past treatments include angiotensin blockers. The current treatment provides moderate improvement. Compliance problems include exercise.  Hypertensive end-organ damage includes CVA (127517) and a thyroid problem.  Thyroid Problem Visit type: hypothyroidism. Patient reports no cold intolerance, constipation, diaphoresis, diarrhea, heat intolerance, menstrual  problem, palpitations or visual change. Her past medical history is significant for hyperlipidemia. There is no history of diabetes.  CVA-  Light stroke- No residual effects- doing well-   Review of Systems  Constitutional: Negative for diaphoresis.  Respiratory: Negative for shortness of breath.   Cardiovascular: Negative for chest pain and palpitations.  Gastrointestinal: Negative for diarrhea and constipation.  Endocrine: Negative for cold intolerance and heat intolerance.  Genitourinary: Negative for menstrual problem.  Musculoskeletal: Negative for neck pain.  Neurological: Negative for headaches.       Objective:   Physical Exam  Constitutional: She is oriented to person, place, and time. She appears well-developed and well-nourished. No distress.  HENT:  Head: Normocephalic.  Right Ear: External ear normal.  Nose: Nose normal.  Mouth/Throat: Oropharynx is clear and moist.  Eyes: EOM are normal.  Neck: Trachea normal, normal range of motion and full passive range of motion without pain. Neck supple. No JVD present. Carotid bruit is not present. No thyromegaly present.  Cardiovascular: Normal rate, regular rhythm, normal heart sounds and intact distal pulses.  Exam reveals no gallop and no friction rub.   No murmur heard. Pulmonary/Chest: Effort normal and breath sounds normal.  Abdominal: Soft. Bowel sounds are normal. She exhibits no distension and no mass. There is no tenderness.  Musculoskeletal: Normal range of motion.  Lymphadenopathy:    She has no cervical adenopathy.  Neurological: She is alert and oriented to person, place, and time. She has normal reflexes.  Skin: Skin is warm and dry.  Psychiatric: She has a normal mood and affect. Her behavior is normal. Judgment and thought content normal.   BP 144/84 mmHg  Pulse 78  Temp(Src) 97.5 F (36.4 C) (Oral)  Ht _0  (1.575 m)  Wt  166 lb (75.297 kg)  BMI 30.35 kg/m2     Assessment & Plan:  1. Essential  hypertension, benign Do not add salt to diet - losartan (COZAAR) 100 MG tablet; Take 1 tablet (100 mg total) by mouth daily.  Dispense: 90 tablet; Refill: 1 - CMP14+EGFR  2. Acute CVA (cerebrovascular accident) ([place])  3. Hypothyroidism, unspecified hypothyroidism type - Thyroid Panel With TSH  4. Hyperlipidemia with target LDL less than 100 Low fat diet - simvastatin (ZOCOR) 40 MG tablet; Take 1 tablet (40 mg total) by mouth daily.  Dispense: 90 tablet; Refill: 1 - Lipid panel  5. BMI 30.0-30.9,adult Discussed diet and exercise for person with BMI >25 Will recheck weight in 3-6 months  6. Screening for malignant neoplasm of the rectum - Fecal occult blood, imunochemical; Future    Labs pending Health maintenance reviewed Diet and exercise encouraged Continue all meds Follow up  In 6 month   [place], FNP

## 2016-06-30 LAB — CMP14+EGFR
A/G RATIO: 1.4 (ref 1.2–2.2)
ALK PHOS: 96 IU/L (ref 39–117)
ALT: 19 IU/L (ref 0–32)
AST: 17 IU/L (ref 0–40)
Albumin: 4.3 g/dL (ref 3.5–5.5)
BILIRUBIN TOTAL: 0.4 mg/dL (ref 0.0–1.2)
BUN/Creatinine Ratio: 14 (ref 9–23)
BUN: 11 mg/dL (ref 6–24)
CO2: 25 mmol/L (ref 18–29)
Calcium: 10.5 mg/dL — ABNORMAL HIGH (ref 8.7–10.2)
Chloride: 98 mmol/L (ref 96–106)
Creatinine, Ser: 0.81 mg/dL (ref 0.57–1.00)
GFR calc Af Amer: 93 mL/min/{1.73_m2} (ref >59)
GFR calc non Af Amer: 81 mL/min/{1.73_m2} (ref >59)
GLUCOSE: 92 mg/dL (ref 65–99)
Globulin, Total: 3 g/dL (ref 1.5–4.5)
POTASSIUM: 4.9 mmol/L (ref 3.5–5.2)
Sodium: 143 mmol/L (ref 134–144)
TOTAL PROTEIN: 7.3 g/dL (ref 6.0–8.5)

## 2016-06-30 LAB — THYROID PANEL WITH TSH
Free Thyroxine Index: 2.5 (ref 1.2–4.9)
T3 Uptake Ratio: 25 % (ref 24–39)
T4, Total: 9.8 ug/dL (ref 4.5–12.0)
TSH: 3.19 u[IU]/mL (ref 0.450–4.500)

## 2016-06-30 LAB — LIPID PANEL
CHOL/HDL RATIO: 3.6 ratio (ref 0.0–4.4)
Cholesterol, Total: 194 mg/dL (ref 100–199)
HDL: 54 mg/dL (ref >39)
LDL Calculated: 123 mg/dL — ABNORMAL HIGH (ref 0–99)
TRIGLYCERIDES: 83 mg/dL (ref 0–149)
VLDL Cholesterol Cal: 17 mg/dL (ref 5–40)

## 2016-09-21 ENCOUNTER — Other Ambulatory Visit: Payer: Self-pay | Admitting: Nurse Practitioner

## 2016-09-21 DIAGNOSIS — I10 Essential (primary) hypertension: Secondary | ICD-10-CM

## 2016-09-22 ENCOUNTER — Telehealth: Payer: Self-pay | Admitting: Nurse Practitioner

## 2016-09-22 DIAGNOSIS — I10 Essential (primary) hypertension: Secondary | ICD-10-CM

## 2016-09-22 MED ORDER — LOSARTAN POTASSIUM 100 MG PO TABS: 100.0000 mg | ORAL_TABLET | Freq: Every day | ORAL | 0 refills | Status: DC

## 2016-09-22 NOTE — Telephone Encounter (Signed)
#`  [phone] sent to pharm

## 2016-09-29 DIAGNOSIS — Z23 Encounter for immunization: Secondary | ICD-10-CM | POA: Diagnosis not present

## 2016-12-29 ENCOUNTER — Encounter (INDEPENDENT_AMBULATORY_CARE_PROVIDER_SITE_OTHER): Payer: Self-pay

## 2016-12-29 ENCOUNTER — Ambulatory Visit (INDEPENDENT_AMBULATORY_CARE_PROVIDER_SITE_OTHER): Payer: BLUE CROSS/BLUE SHIELD | Admitting: Nurse Practitioner

## 2016-12-29 ENCOUNTER — Encounter: Payer: Self-pay | Admitting: Nurse Practitioner

## 2016-12-29 VITALS — BP 142/88 | HR 70 | Temp 97.9°F | Ht 62.0 in | Wt 163.2 lb

## 2016-12-29 DIAGNOSIS — Z1211 Encounter for screening for malignant neoplasm of colon: Secondary | ICD-10-CM

## 2016-12-29 DIAGNOSIS — Z6830 Body mass index (BMI) 30.0-30.9, adult: Secondary | ICD-10-CM

## 2016-12-29 DIAGNOSIS — E785 Hyperlipidemia, unspecified: Secondary | ICD-10-CM

## 2016-12-29 DIAGNOSIS — E039 Hypothyroidism, unspecified: Secondary | ICD-10-CM

## 2016-12-29 DIAGNOSIS — Z1212 Encounter for screening for malignant neoplasm of rectum: Secondary | ICD-10-CM | POA: Diagnosis not present

## 2016-12-29 DIAGNOSIS — I6930 Unspecified sequelae of cerebral infarction: Secondary | ICD-10-CM | POA: Diagnosis not present

## 2016-12-29 DIAGNOSIS — I10 Essential (primary) hypertension: Secondary | ICD-10-CM | POA: Diagnosis not present

## 2016-12-29 MED ORDER — LEVOTHYROXINE SODIUM 75 MCG PO TABS: 75.0000 ug | ORAL_TABLET | Freq: Every day | ORAL | 2 refills | Status: DC

## 2016-12-29 MED ORDER — LOSARTAN POTASSIUM 100 MG PO TABS: 100.0000 mg | ORAL_TABLET | Freq: Every day | ORAL | 0 refills | Status: DC

## 2016-12-29 MED ORDER — SIMVASTATIN 40 MG PO TABS: 40.0000 mg | ORAL_TABLET | Freq: Every day | ORAL | 1 refills | Status: DC

## 2016-12-29 NOTE — Progress Notes (Signed)
Subjective:    Patient ID: Brooke Newton, female    DOB: 12-08-1961, 56 y.o.   MRN: [id number]  Patient here today for follow up of chronic medical problems. No changes since last visit. No complaints.  Outpatient Encounter Prescriptions as of 12/29/2016  Medication Sig  . levothyroxine (SYNTHROID, LEVOTHROID) 75 MCG tablet Take 1 tablet (75 mcg total) by mouth daily.  Marland Kitchen losartan (COZAAR) 100 MG tablet Take 1 tablet (100 mg total) by mouth daily.  . simvastatin (ZOCOR) 40 MG tablet Take 1 tablet (40 mg total) by mouth daily.   No facility-administered encounter medications on file as of 12/29/2016.    Hyperlipidemia  This is a chronic problem. The current episode started more than 1 year ago. The problem is uncontrolled. Recent lipid tests were reviewed and are high. Exacerbating diseases include hypothyroidism and obesity. She has no history of diabetes. Pertinent negatives include no chest pain or shortness of breath. Current antihyperlipidemic treatment includes statins (has not been her zocor- thought was making legs weak but ahas been off for 3 weeks with no change.). The current treatment provides mild improvement of lipids. Compliance problems include adherence to diet and adherence to exercise.  Risk factors for coronary artery disease include dyslipidemia, hypertension, obesity and post-menopausal.  Hypertension  This is a chronic problem. The current episode started more than 1 year ago. The problem is uncontrolled. Pertinent negatives include no chest pain, headaches, neck pain, palpitations or shortness of breath. Risk factors for coronary artery disease include dyslipidemia. Past treatments include angiotensin blockers. The current treatment provides moderate improvement. Compliance problems include exercise.  Hypertensive end-organ damage includes CVA (650354) and a thyroid problem.  Thyroid Problem  Visit type: hypothyroidism. Patient reports no cold intolerance, constipation,  diaphoresis, diarrhea, heat intolerance, menstrual problem, palpitations or visual change. Her past medical history is significant for hyperlipidemia. There is no history of diabetes.  CVA-  Light stroke- No residual effects- doing well-   Review of Systems  Constitutional: Negative for diaphoresis.  Respiratory: Negative for shortness of breath.   Cardiovascular: Negative for chest pain and palpitations.  Gastrointestinal: Negative for constipation and diarrhea.  Endocrine: Negative for cold intolerance and heat intolerance.  Genitourinary: Negative for menstrual problem.  Musculoskeletal: Negative for neck pain.  Neurological: Negative for headaches.       Objective:   Physical Exam  Constitutional: She is oriented to person, place, and time. She appears well-developed and well-nourished. No distress.  HENT:  Head: Normocephalic.  Right Ear: External ear normal.  Nose: Nose normal.  Mouth/Throat: Oropharynx is clear and moist.  Eyes: EOM are normal.  Neck: Trachea normal, normal range of motion and full passive range of motion without pain. Neck supple. No JVD present. Carotid bruit is not present. No thyromegaly present.  Cardiovascular: Normal rate, regular rhythm, normal heart sounds and intact distal pulses.  Exam reveals no gallop and no friction rub.   No murmur heard. Pulmonary/Chest: Effort normal and breath sounds normal.  Abdominal: Soft. Bowel sounds are normal. She exhibits no distension and no mass. There is no tenderness.  Musculoskeletal: Normal range of motion.  Lymphadenopathy:    She has no cervical adenopathy.  Neurological: She is alert and oriented to person, place, and time. She has normal reflexes.  Skin: Skin is warm and dry.  Psychiatric: She has a normal mood and affect. Her behavior is normal. Judgment and thought content normal.    BP (!) 142/88 (BP Location: Left Arm, Cuff Size: Normal)  Pulse 70   Temp 97.9 F (36.6 C) (Oral)   Ht _0   (1.575 m)   Wt 163 lb 3.2 oz (74 kg)   BMI 29.85 kg/m       Assessment & Plan:  1. Essential hypertension, benign Low sodium diet - losartan (COZAAR) 100 MG tablet; Take 1 tablet (100 mg total) by mouth daily.  Dispense: 14 tablet; Refill: 0 - CMP14+EGFR  2. Hypothyroidism, unspecified type - levothyroxine (SYNTHROID, LEVOTHROID) 75 MCG tablet; Take 1 tablet (75 mcg total) by mouth daily.  Dispense: 90 tablet; Refill: 2 - Thyroid Panel With TSH  3. BMI 30.0-30.9,adult Discussed diet and exercise for person with BMI >25 Will recheck weight in 3-6 months  4. Hyperlipidemia with target LDL less than 100 Low fat diet - simvastatin (ZOCOR) 40 MG tablet; Take 1 tablet (40 mg total) by mouth daily.  Dispense: 90 tablet; Refill: 1 - Lipid panel  5. Late effect of cerebrovascular accident  6. Encounter for colorectal cancer screening - Fecal occult blood, imunochemical; Future    Labs pending Health maintenance reviewed Diet and exercise encouraged Continue all meds Follow up  In 6 month   [place], FNP

## 2016-12-29 NOTE — Patient Instructions (Signed)
DASH Eating Plan DASH stands for "Dietary Approaches to Stop Hypertension." The DASH eating plan is a healthy eating plan that has been shown to reduce high blood pressure (hypertension). Additional health benefits may include reducing the risk of type 2 diabetes mellitus, heart disease, and stroke. The DASH eating plan may also help with weight loss. What do I need to know about the DASH eating plan? For the DASH eating plan, you will follow these general guidelines:  Choose foods with less than 150 milligrams of sodium per serving (as listed on the food label).  Use salt-free seasonings or herbs instead of table salt or sea salt.  Check with your health care provider or pharmacist before using salt substitutes.  Eat lower-sodium products. These are often labeled as "low-sodium" or "no salt added."  Eat fresh foods. Avoid eating a lot of canned foods.  Eat more vegetables, fruits, and low-fat dairy products.  Choose whole grains. Look for the word "whole" as the first word in the ingredient list.  Choose fish and skinless chicken or turkey more often than red meat. Limit fish, poultry, and meat to 6 oz (170 g) each day.  Limit sweets, desserts, sugars, and sugary drinks.  Choose heart-healthy fats.  Eat more home-cooked food and less restaurant, buffet, and fast food.  Limit fried foods.  Do not fry foods. Cook foods using methods such as baking, boiling, grilling, and broiling instead.  When eating at a restaurant, ask that your food be prepared with less salt, or no salt if possible. What foods can I eat? Seek help from a dietitian for individual calorie needs. Grains  Whole grain or whole wheat bread. Brown rice. Whole grain or whole wheat pasta. Quinoa, bulgur, and whole grain cereals. Low-sodium cereals. Corn or whole wheat flour tortillas. Whole grain cornbread. Whole grain crackers. Low-sodium crackers. Vegetables  Fresh or frozen vegetables (raw, steamed, roasted, or  grilled). Low-sodium or reduced-sodium tomato and vegetable juices. Low-sodium or reduced-sodium tomato sauce and paste. Low-sodium or reduced-sodium canned vegetables. Fruits  All fresh, canned (in natural juice), or frozen fruits. Meat and Other Protein Products  Ground beef (85% or leaner), grass-fed beef, or beef trimmed of fat. Skinless chicken or turkey. Ground chicken or turkey. Pork trimmed of fat. All fish and seafood. Eggs. Dried beans, peas, or lentils. Unsalted nuts and seeds. Unsalted canned beans. Dairy  Low-fat dairy products, such as skim or 1% milk, 2% or reduced-fat cheeses, low-fat ricotta or cottage cheese, or plain low-fat yogurt. Low-sodium or reduced-sodium cheeses. Fats and Oils  Tub margarines without trans fats. Light or reduced-fat mayonnaise and salad dressings (reduced sodium). Avocado. Safflower, olive, or canola oils. Natural peanut or almond butter. Other  Unsalted popcorn and pretzels. The items listed above may not be a complete list of recommended foods or beverages. Contact your dietitian for more options.  What foods are not recommended? Grains  White bread. White pasta. White rice. Refined cornbread. Bagels and croissants. Crackers that contain trans fat. Vegetables  Creamed or fried vegetables. Vegetables in a cheese sauce. Regular canned vegetables. Regular canned tomato sauce and paste. Regular tomato and vegetable juices. Fruits  Canned fruit in light or heavy syrup. Fruit juice. Meat and Other Protein Products  Fatty cuts of meat. Ribs, chicken wings, bacon, sausage, bologna, salami, chitterlings, fatback, hot dogs, bratwurst, and packaged luncheon meats. Salted nuts and seeds. Canned beans with salt. Dairy  Whole or 2% milk, cream, half-and-half, and cream cheese. Whole-fat or sweetened yogurt. Full-fat cheeses   or blue cheese. Nondairy creamers and whipped toppings. Processed cheese, cheese spreads, or cheese curds. Condiments  Onion and garlic  salt, seasoned salt, table salt, and sea salt. Canned and packaged gravies. Worcestershire sauce. Tartar sauce. Barbecue sauce. Teriyaki sauce. Soy sauce, including reduced sodium. Steak sauce. Fish sauce. Oyster sauce. Cocktail sauce. Horseradish. Ketchup and mustard. Meat flavorings and tenderizers. Bouillon cubes. Hot sauce. Tabasco sauce. Marinades. Taco seasonings. Relishes. Fats and Oils  Butter, stick margarine, lard, shortening, ghee, and bacon fat. Coconut, palm kernel, or palm oils. Regular salad dressings. Other  Pickles and olives. Salted popcorn and pretzels. The items listed above may not be a complete list of foods and beverages to avoid. Contact your dietitian for more information.  Where can I find more information? National Heart, Lung, and Blood Institute: www.nhlbi.nih.gov/health/health-topics/topics/dash/ This information is not intended to replace advice given to you by your health care provider. Make sure you discuss any questions you have with your health care provider. Document Released: 11/26/2011 Document Revised: 05/14/2016 Document Reviewed: 10/11/2013 Elsevier Interactive Patient Education  2017 [employer] Inc.  

## 2016-12-30 LAB — LIPID PANEL
CHOL/HDL RATIO: 3.8 ratio (ref 0.0–4.4)
Cholesterol, Total: 193 mg/dL (ref 100–199)
HDL: 51 mg/dL (ref >39)
LDL Calculated: 126 mg/dL — ABNORMAL HIGH (ref 0–99)
Triglycerides: 78 mg/dL (ref 0–149)
VLDL Cholesterol Cal: 16 mg/dL (ref 5–40)

## 2016-12-30 LAB — THYROID PANEL WITH TSH
FREE THYROXINE INDEX: 2.8 (ref 1.2–4.9)
T3 Uptake Ratio: 28 % (ref 24–39)
T4 TOTAL: 10 ug/dL (ref 4.5–12.0)
TSH: 3.58 u[IU]/mL (ref 0.450–4.500)

## 2016-12-30 LAB — CMP14+EGFR
A/G RATIO: 1.6 (ref 1.2–2.2)
ALBUMIN: 4.4 g/dL (ref 3.5–5.5)
ALT: 17 IU/L (ref 0–32)
AST: 18 IU/L (ref 0–40)
Alkaline Phosphatase: 92 IU/L (ref 39–117)
BILIRUBIN TOTAL: 0.4 mg/dL (ref 0.0–1.2)
BUN / CREAT RATIO: 14 (ref 9–23)
BUN: 11 mg/dL (ref 6–24)
CALCIUM: 9.5 mg/dL (ref 8.7–10.2)
CHLORIDE: 101 mmol/L (ref 96–106)
CO2: 25 mmol/L (ref 18–29)
Creatinine, Ser: 0.78 mg/dL (ref 0.57–1.00)
GFR, EST AFRICAN AMERICAN: 98 mL/min/{1.73_m2} (ref >59)
GFR, EST NON AFRICAN AMERICAN: 85 mL/min/{1.73_m2} (ref >59)
GLOBULIN, TOTAL: 2.7 g/dL (ref 1.5–4.5)
Glucose: 94 mg/dL (ref 65–99)
POTASSIUM: 4.2 mmol/L (ref 3.5–5.2)
Sodium: 141 mmol/L (ref 134–144)
TOTAL PROTEIN: 7.1 g/dL (ref 6.0–8.5)

## 2017-01-22 ENCOUNTER — Other Ambulatory Visit: Payer: Self-pay | Admitting: Nurse Practitioner

## 2017-01-22 DIAGNOSIS — I10 Essential (primary) hypertension: Secondary | ICD-10-CM

## 2017-01-27 ENCOUNTER — Telehealth: Payer: Self-pay | Admitting: Nurse Practitioner

## 2017-01-27 DIAGNOSIS — I10 Essential (primary) hypertension: Secondary | ICD-10-CM

## 2017-01-27 NOTE — Telephone Encounter (Signed)
Patient requested that a month supply of [facility] [facility] [place] since she has not got her mail order and is about out of medication. Rx sent as requested.

## 2017-01-28 MED ORDER — LOSARTAN POTASSIUM 100 MG PO TABS: 100.0000 mg | ORAL_TABLET | Freq: Every day | ORAL | 1 refills | Status: DC

## 2017-01-28 MED ORDER — LOSARTAN POTASSIUM 100 MG PO TABS: 100.0000 mg | ORAL_TABLET | Freq: Every day | ORAL | 0 refills | Status: DC

## 2017-01-28 NOTE — Telephone Encounter (Signed)
Pt aware rx sent to the pharmacy. 

## 2017-02-25 ENCOUNTER — Other Ambulatory Visit: Payer: Self-pay | Admitting: Nurse Practitioner

## 2017-02-25 DIAGNOSIS — I10 Essential (primary) hypertension: Secondary | ICD-10-CM

## 2017-03-15 DIAGNOSIS — H40013 Open angle with borderline findings, low risk, bilateral: Secondary | ICD-10-CM | POA: Diagnosis not present

## 2017-06-28 ENCOUNTER — Ambulatory Visit (INDEPENDENT_AMBULATORY_CARE_PROVIDER_SITE_OTHER): Payer: BLUE CROSS/BLUE SHIELD | Admitting: Nurse Practitioner

## 2017-06-28 ENCOUNTER — Encounter: Payer: Self-pay | Admitting: Nurse Practitioner

## 2017-06-28 ENCOUNTER — Other Ambulatory Visit: Payer: BLUE CROSS/BLUE SHIELD

## 2017-06-28 VITALS — BP 162/89 | HR 73 | Temp 96.9°F | Ht 62.0 in | Wt 159.0 lb

## 2017-06-28 DIAGNOSIS — I10 Essential (primary) hypertension: Secondary | ICD-10-CM | POA: Diagnosis not present

## 2017-06-28 DIAGNOSIS — Z6830 Body mass index (BMI) 30.0-30.9, adult: Secondary | ICD-10-CM | POA: Diagnosis not present

## 2017-06-28 DIAGNOSIS — E785 Hyperlipidemia, unspecified: Secondary | ICD-10-CM

## 2017-06-28 DIAGNOSIS — Z13820 Encounter for screening for osteoporosis: Secondary | ICD-10-CM | POA: Diagnosis not present

## 2017-06-28 DIAGNOSIS — I6930 Unspecified sequelae of cerebral infarction: Secondary | ICD-10-CM | POA: Diagnosis not present

## 2017-06-28 DIAGNOSIS — E039 Hypothyroidism, unspecified: Secondary | ICD-10-CM

## 2017-06-28 MED ORDER — LOSARTAN POTASSIUM 100 MG PO TABS: 100.0000 mg | ORAL_TABLET | Freq: Every day | ORAL | 1 refills | Status: DC

## 2017-06-28 MED ORDER — LEVOTHYROXINE SODIUM 75 MCG PO TABS: 75.0000 ug | ORAL_TABLET | Freq: Every day | ORAL | 2 refills | Status: DC

## 2017-06-28 MED ORDER — SIMVASTATIN 40 MG PO TABS: 40.0000 mg | ORAL_TABLET | Freq: Every day | ORAL | 1 refills | Status: DC

## 2017-06-28 NOTE — Patient Instructions (Signed)
DASH Eating Plan DASH stands for "Dietary Approaches to Stop Hypertension." The DASH eating plan is a healthy eating plan that has been shown to reduce high blood pressure (hypertension). It may also reduce your risk for type 2 diabetes, heart disease, and stroke. The DASH eating plan may also help with weight loss. What are tips for following this plan? General guidelines  Avoid eating more than 2,300 mg (milligrams) of salt (sodium) a day. If you have hypertension, you may need to reduce your sodium intake to 1,500 mg a day.  Limit alcohol intake to no more than 1 drink a day for nonpregnant women and 2 drinks a day for men. One drink equals 12 oz of beer, 5 oz of wine, or 1 oz of hard liquor.  Work with your health care provider to maintain a healthy body weight or to lose weight. Ask what an ideal weight is for you.  Get at least 30 minutes of exercise that causes your heart to beat faster (aerobic exercise) most days of the week. Activities may include walking, swimming, or biking.  Work with your health care provider or diet and nutrition specialist (dietitian) to adjust your eating plan to your individual calorie needs. Reading food labels  Check food labels for the amount of sodium per serving. Choose foods with less than 5 percent of the Daily Value of sodium. Generally, foods with less than 300 mg of sodium per serving fit into this eating plan.  To find whole grains, look for the word "whole" as the first word in the ingredient list. Shopping  Buy products labeled as "low-sodium" or "no salt added."  Buy fresh foods. Avoid canned foods and premade or frozen meals. Cooking  Avoid adding salt when cooking. Use salt-free seasonings or herbs instead of table salt or sea salt. Check with your health care provider or pharmacist before using salt substitutes.  Do not fry foods. Cook foods using healthy methods such as baking, boiling, grilling, and broiling instead.  Cook with  heart-healthy oils, such as olive, canola, soybean, or sunflower oil. Meal planning   Eat a balanced diet that includes: ? 5 or more servings of fruits and vegetables each day. At each meal, try to fill half of your plate with fruits and vegetables. ? Up to 6-8 servings of whole grains each day. ? Less than 6 oz of lean meat, poultry, or fish each day. A 3-oz serving of meat is about the same size as a deck of cards. One egg equals 1 oz. ? 2 servings of low-fat dairy each day. ? A serving of nuts, seeds, or beans 5 times each week. ? Heart-healthy fats. Healthy fats called Omega-3 fatty acids are found in foods such as flaxseeds and coldwater fish, like sardines, salmon, and mackerel.  Limit how much you eat of the following: ? Canned or prepackaged foods. ? Food that is high in trans fat, such as fried foods. ? Food that is high in saturated fat, such as fatty meat. ? Sweets, desserts, sugary drinks, and other foods with added sugar. ? Full-fat dairy products.  Do not salt foods before eating.  Try to eat at least 2 vegetarian meals each week.  Eat more home-cooked food and less restaurant, buffet, and fast food.  When eating at a restaurant, ask that your food be prepared with less salt or no salt, if possible. What foods are recommended? The items listed may not be a complete list. Talk with your dietitian about what   dietary choices are best for you. Grains Whole-grain or whole-wheat bread. Whole-grain or whole-wheat pasta. Brown rice. Oatmeal. Quinoa. Bulgur. Whole-grain and low-sodium cereals. Pita bread. Low-fat, low-sodium crackers. Whole-wheat flour tortillas. Vegetables Fresh or frozen vegetables (raw, steamed, roasted, or grilled). Low-sodium or reduced-sodium tomato and vegetable juice. Low-sodium or reduced-sodium tomato sauce and tomato paste. Low-sodium or reduced-sodium canned vegetables. Fruits All fresh, dried, or frozen fruit. Canned fruit in natural juice (without  added sugar). Meat and other protein foods Skinless chicken or turkey. Ground chicken or turkey. Pork with fat trimmed off. Fish and seafood. Egg whites. Dried beans, peas, or lentils. Unsalted nuts, nut butters, and seeds. Unsalted canned beans. Lean cuts of beef with fat trimmed off. Low-sodium, lean deli meat. Dairy Low-fat (1%) or fat-free (skim) milk. Fat-free, low-fat, or reduced-fat cheeses. Nonfat, low-sodium ricotta or cottage cheese. Low-fat or nonfat yogurt. Low-fat, low-sodium cheese. Fats and oils Soft margarine without trans fats. Vegetable oil. Low-fat, reduced-fat, or light mayonnaise and salad dressings (reduced-sodium). Canola, safflower, olive, soybean, and sunflower oils. Avocado. Seasoning and other foods Herbs. Spices. Seasoning mixes without salt. Unsalted popcorn and pretzels. Fat-free sweets. What foods are not recommended? The items listed may not be a complete list. Talk with your dietitian about what dietary choices are best for you. Grains Baked goods made with fat, such as croissants, muffins, or some breads. Dry pasta or rice meal packs. Vegetables Creamed or fried vegetables. Vegetables in a cheese sauce. Regular canned vegetables (not low-sodium or reduced-sodium). Regular canned tomato sauce and paste (not low-sodium or reduced-sodium). Regular tomato and vegetable juice (not low-sodium or reduced-sodium). Pickles. Olives. Fruits Canned fruit in a light or heavy syrup. Fried fruit. Fruit in cream or butter sauce. Meat and other protein foods Fatty cuts of meat. Ribs. Fried meat. Bacon. Sausage. Bologna and other processed lunch meats. Salami. Fatback. Hotdogs. Bratwurst. Salted nuts and seeds. Canned beans with added salt. Canned or smoked fish. Whole eggs or egg yolks. Chicken or turkey with skin. Dairy Whole or 2% milk, cream, and half-and-half. Whole or full-fat cream cheese. Whole-fat or sweetened yogurt. Full-fat cheese. Nondairy creamers. Whipped toppings.  Processed cheese and cheese spreads. Fats and oils Butter. Stick margarine. Lard. Shortening. Ghee. Bacon fat. Tropical oils, such as coconut, palm kernel, or palm oil. Seasoning and other foods Salted popcorn and pretzels. Onion salt, garlic salt, seasoned salt, table salt, and sea salt. Worcestershire sauce. Tartar sauce. Barbecue sauce. Teriyaki sauce. Soy sauce, including reduced-sodium. Steak sauce. Canned and packaged gravies. Fish sauce. Oyster sauce. Cocktail sauce. Horseradish that you find on the shelf. Ketchup. Mustard. Meat flavorings and tenderizers. Bouillon cubes. Hot sauce and Tabasco sauce. Premade or packaged marinades. Premade or packaged taco seasonings. Relishes. Regular salad dressings. Where to find more information:  National Heart, Lung, and Blood Institute: www.nhlbi.nih.gov  American Heart Association: www.heart.org Summary  The DASH eating plan is a healthy eating plan that has been shown to reduce high blood pressure (hypertension). It may also reduce your risk for type 2 diabetes, heart disease, and stroke.  With the DASH eating plan, you should limit salt (sodium) intake to 2,300 mg a day. If you have hypertension, you may need to reduce your sodium intake to 1,500 mg a day.  When on the DASH eating plan, aim to eat more fresh fruits and vegetables, whole grains, lean proteins, low-fat dairy, and heart-healthy fats.  Work with your health care provider or diet and nutrition specialist (dietitian) to adjust your eating plan to your individual   calorie needs. This information is not intended to replace advice given to you by your health care provider. Make sure you discuss any questions you have with your health care provider. Document Released: 11/26/2011 Document Revised: 11/30/2016 Document Reviewed: 11/30/2016 Elsevier Interactive Patient Education  2017 [employer] Inc.  

## 2017-06-28 NOTE — Progress Notes (Signed)
Subjective:    Patient ID: Brooke Newton, female    DOB: 1961-11-08, 56 y.o.   MRN: [id number]  HPI  Junella Domke is here today for follow up of chronic medical problem. Patient has not taken meds today.  Outpatient Encounter Prescriptions as of 06/28/2017  Medication Sig  . aspirin 325 MG tablet Take 325 mg by mouth daily.  Marland Kitchen levothyroxine (SYNTHROID, LEVOTHROID) 75 MCG tablet Take 1 tablet (75 mcg total) by mouth daily.  Marland Kitchen losartan (COZAAR) 100 MG tablet Take 1 tablet (100 mg total) by mouth daily.  . simvastatin (ZOCOR) 40 MG tablet Take 1 tablet (40 mg total) by mouth daily. (Patient not taking: Reported on 06/28/2017)     1. Essential hypertension, benign  No c/o chest pain, SOB or HA. Does not check blood pressures at home  2. Hypothyroidism, unspecified type  No problems that she is aware of  3. Late effect of cerebrovascular accident  No residulal effects that she is aware of  4. Hyperlipidemia with target LDL less than 100  Patient tries to watch diet  5. BMI 30.0-30.9,adult   no significant change in weight    New complaints: None today     Review of Systems  Constitutional: Negative for activity change and appetite change.  HENT: Negative.   Eyes: Negative for pain.  Respiratory: Negative for shortness of breath.   Cardiovascular: Negative for chest pain, palpitations and leg swelling.  Gastrointestinal: Negative for abdominal pain.  Endocrine: Negative for polydipsia.  Genitourinary: Negative.   Skin: Negative for rash.  Neurological: Negative for dizziness, weakness and headaches.  Hematological: Does not bruise/bleed easily.  Psychiatric/Behavioral: Negative.   All other systems reviewed and are negative.      Objective:   Physical Exam  Constitutional: She is oriented to person, place, and time. She appears well-developed and well-nourished.  HENT:  Nose: Nose normal.  Mouth/Throat: Oropharynx is clear and moist.  Eyes: EOM are normal.  Neck:  Trachea normal, normal range of motion and full passive range of motion without pain. Neck supple. No JVD present. Carotid bruit is not present. No thyromegaly present.  Cardiovascular: Normal rate, regular rhythm, normal heart sounds and intact distal pulses.  Exam reveals no gallop and no friction rub.   No murmur heard. Pulmonary/Chest: Effort normal and breath sounds normal.  Abdominal: Soft. Bowel sounds are normal. She exhibits no distension and no mass. There is no tenderness.  Musculoskeletal: Normal range of motion.  Lymphadenopathy:    She has no cervical adenopathy.  Neurological: She is alert and oriented to person, place, and time. She has normal reflexes.  Skin: Skin is warm and dry.  Psychiatric: She has a normal mood and affect. Her behavior is normal. Judgment and thought content normal.    BP (!) 162/89   Pulse 73   Temp (!) 96.9 F (36.1 C) (Oral)   Ht '5\' 2"'  (1.575 m)   Wt 159 lb (72.1 kg)   BMI 29.08 kg/m      Assessment & Plan:  1. Essential hypertension, benign Low sodium diet - losartan (COZAAR) 100 MG tablet; Take 1 tablet (100 mg total) by mouth daily.  Dispense: 90 tablet; Refill: 1 - CMP14+EGFR  2. Hypothyroidism, unspecified type - levothyroxine (SYNTHROID, LEVOTHROID) 75 MCG tablet; Take 1 tablet (75 mcg total) by mouth daily.  Dispense: 90 tablet; Refill: 2 - Thyroid Panel With TSH  3. Late effect of cerebrovascular accident   4. Hyperlipidemia with target LDL less than  100 Low fat diet - simvastatin (ZOCOR) 40 MG tablet; Take 1 tablet (40 mg total) by mouth daily.  Dispense: 90 tablet; Refill: 1 - Lipid panel  5. BMI 30.0-30.9,adult Discussed diet and exercise for person with BMI >25 Will recheck weight in 3-6 months    Labs pending Health maintenance reviewed Diet and exercise encouraged Continue all meds Follow up  In 6 months  [place], FNP

## 2017-06-29 LAB — LIPID PANEL
CHOL/HDL RATIO: 4 ratio (ref 0.0–4.4)
CHOLESTEROL TOTAL: 195 mg/dL (ref 100–199)
HDL: 49 mg/dL (ref >39)
LDL CALC: 125 mg/dL — AB (ref 0–99)
Triglycerides: 106 mg/dL (ref 0–149)
VLDL CHOLESTEROL CAL: 21 mg/dL (ref 5–40)

## 2017-06-29 LAB — CMP14+EGFR
ALBUMIN: 4.3 g/dL (ref 3.5–5.5)
ALK PHOS: 84 IU/L (ref 39–117)
ALT: 18 IU/L (ref 0–32)
AST: 24 IU/L (ref 0–40)
Albumin/Globulin Ratio: 1.6 (ref 1.2–2.2)
BUN / CREAT RATIO: 9 (ref 9–23)
BUN: 7 mg/dL (ref 6–24)
Bilirubin Total: 0.4 mg/dL (ref 0.0–1.2)
CALCIUM: 9.4 mg/dL (ref 8.7–10.2)
CO2: 23 mmol/L (ref 20–29)
CREATININE: 0.76 mg/dL (ref 0.57–1.00)
Chloride: 102 mmol/L (ref 96–106)
GFR, EST AFRICAN AMERICAN: 100 mL/min/{1.73_m2} (ref >59)
GFR, EST NON AFRICAN AMERICAN: 87 mL/min/{1.73_m2} (ref >59)
GLOBULIN, TOTAL: 2.7 g/dL (ref 1.5–4.5)
GLUCOSE: 90 mg/dL (ref 65–99)
Potassium: 4.6 mmol/L (ref 3.5–5.2)
SODIUM: 142 mmol/L (ref 134–144)
TOTAL PROTEIN: 7 g/dL (ref 6.0–8.5)

## 2017-06-29 LAB — THYROID PANEL WITH TSH
Free Thyroxine Index: 2.6 (ref 1.2–4.9)
T3 UPTAKE RATIO: 27 % (ref 24–39)
T4 TOTAL: 9.6 ug/dL (ref 4.5–12.0)
TSH: 3.75 u[IU]/mL (ref 0.450–4.500)

## 2017-07-26 ENCOUNTER — Other Ambulatory Visit: Payer: Self-pay | Admitting: Nurse Practitioner

## 2017-07-26 DIAGNOSIS — I10 Essential (primary) hypertension: Secondary | ICD-10-CM

## 2017-10-04 DIAGNOSIS — H40013 Open angle with borderline findings, low risk, bilateral: Secondary | ICD-10-CM | POA: Diagnosis not present

## 2017-10-25 ENCOUNTER — Other Ambulatory Visit: Payer: Self-pay | Admitting: Nurse Practitioner

## 2017-10-25 DIAGNOSIS — E039 Hypothyroidism, unspecified: Secondary | ICD-10-CM

## 2017-10-25 NOTE — Telephone Encounter (Signed)
lmtcb making sure pt has changed mail order pharmacy's

## 2017-12-07 DIAGNOSIS — H40013 Open angle with borderline findings, low risk, bilateral: Secondary | ICD-10-CM | POA: Diagnosis not present

## 2018-01-03 ENCOUNTER — Ambulatory Visit: Payer: BLUE CROSS/BLUE SHIELD | Admitting: Nurse Practitioner

## 2018-01-31 ENCOUNTER — Other Ambulatory Visit: Payer: Self-pay | Admitting: Nurse Practitioner

## 2018-01-31 DIAGNOSIS — I10 Essential (primary) hypertension: Secondary | ICD-10-CM

## 2018-01-31 NOTE — Telephone Encounter (Signed)
OV 02/08/18

## 2018-02-08 ENCOUNTER — Encounter: Payer: Self-pay | Admitting: Nurse Practitioner

## 2018-02-08 ENCOUNTER — Ambulatory Visit: Payer: BLUE CROSS/BLUE SHIELD | Admitting: Nurse Practitioner

## 2018-02-08 VITALS — BP 138/82 | HR 80 | Temp 97.1°F | Ht 62.0 in | Wt 165.0 lb

## 2018-02-08 DIAGNOSIS — E785 Hyperlipidemia, unspecified: Secondary | ICD-10-CM | POA: Diagnosis not present

## 2018-02-08 DIAGNOSIS — I10 Essential (primary) hypertension: Secondary | ICD-10-CM

## 2018-02-08 DIAGNOSIS — E039 Hypothyroidism, unspecified: Secondary | ICD-10-CM | POA: Diagnosis not present

## 2018-02-08 DIAGNOSIS — I6930 Unspecified sequelae of cerebral infarction: Secondary | ICD-10-CM | POA: Diagnosis not present

## 2018-02-08 DIAGNOSIS — Z6830 Body mass index (BMI) 30.0-30.9, adult: Secondary | ICD-10-CM | POA: Diagnosis not present

## 2018-02-08 MED ORDER — LEVOTHYROXINE SODIUM 75 MCG PO TABS: 75.0000 ug | ORAL_TABLET | Freq: Every day | ORAL | 1 refills | Status: DC

## 2018-02-08 NOTE — Patient Instructions (Signed)

## 2018-02-08 NOTE — Progress Notes (Signed)
Subjective:    Patient ID: Brooke Newton, female    DOB: 1960-12-28, 57 y.o.   MRN: [id number]  HPI   Brooke Newton is here today for follow up of chronic medical problem.  Outpatient Encounter Medications as of 02/08/2018  Medication Sig  . aspirin 325 MG tablet Take 325 mg by mouth daily.  Marland Kitchen levothyroxine (SYNTHROID, LEVOTHROID) 75 MCG tablet Take 1 tablet (75 mcg total) by mouth daily.  Marland Kitchen levothyroxine (SYNTHROID, LEVOTHROID) 75 MCG tablet TAKE 1 TABLET BY MOUTH  DAILY  . losartan (COZAAR) 100 MG tablet TAKE 1 TABLET BY MOUTH  DAILY  . simvastatin (ZOCOR) 40 MG tablet Take 1 tablet (40 mg total) by mouth daily.     1. Essential hypertension, benign  No c/o chest pain, sob or headache. Checks blood pressures at home and runs [id number] systolic. BP Readings from Last 3 Encounters:  06/28/17 (!) 162/89  12/29/16 (!) 142/88  06/29/16 (!) 144/84     2. Hypothyroidism, unspecified type  No problems that she is aware of  3. BMI 30.0-30.9,adult  No recent weight changes  4. Hyperlipidemia with target LDL less than 100  Does not really watch diet very closely  5. Late effect of cerebrovascular accident  She denies any weakness or speech problems.    New complaints: None today  Social history: Works as a Emergency planning/management officer   Review of Systems  Constitutional: Negative for activity change and appetite change.  HENT: Negative.   Eyes: Negative for pain.  Respiratory: Negative for shortness of breath.   Cardiovascular: Negative for chest pain, palpitations and leg swelling.  Gastrointestinal: Negative for abdominal pain.  Endocrine: Negative for polydipsia.  Genitourinary: Negative.   Skin: Negative for rash.  Neurological: Negative for dizziness, weakness and headaches.  Hematological: Does not bruise/bleed easily.  Psychiatric/Behavioral: Negative.   All other systems reviewed and are negative.      Objective:   Physical Exam  Constitutional: She is oriented to person,  place, and time. She appears well-developed and well-nourished.  HENT:  Nose: Nose normal.  Mouth/Throat: Oropharynx is clear and moist.  Eyes: EOM are normal.  Neck: Trachea normal, normal range of motion and full passive range of motion without pain. Neck supple. No JVD present. Carotid bruit is not present. No thyromegaly present.  Cardiovascular: Normal rate, regular rhythm, normal heart sounds and intact distal pulses. Exam reveals no gallop and no friction rub.  No murmur heard. Pulmonary/Chest: Effort normal and breath sounds normal.  Abdominal: Soft. Bowel sounds are normal. She exhibits no distension and no mass. There is no tenderness.  Musculoskeletal: Normal range of motion.  Lymphadenopathy:    She has no cervical adenopathy.  Neurological: She is alert and oriented to person, place, and time. She has normal reflexes.  Skin: Skin is warm and dry.  Psychiatric: She has a normal mood and affect. Her behavior is normal. Judgment and thought content normal.    BP 138/82 (BP Location: Left Arm, Cuff Size: Normal)   Pulse 80   Temp (!) 97.1 F (36.2 C) (Oral)   Ht '5\' 2"'  (1.575 m)   Wt 165 lb (74.8 kg)   BMI 30.18 kg/m       Assessment & Plan:  1. Essential hypertension, benign Low sodium diet - CMP14+EGFR  2. Hypothyroidism, unspecified type - levothyroxine (SYNTHROID, LEVOTHROID) 75 MCG tablet; Take 1 tablet (75 mcg total) by mouth daily.  Dispense: 90 tablet; Refill: 1 - Thyroid Panel With TSH  3.  BMI 30.0-30.9,adult Discussed diet and exercise for person with BMI >25 Will recheck weight in 3-6 months  4. Hyperlipidemia with target LDL less than 100 Low fat diet - Lipid panel  5. Late effect of cerebrovascular accident    Labs pending Health maintenance reviewed Diet and exercise encouraged Continue all meds Follow up  In 6 month   [place], FNP

## 2018-02-09 LAB — CMP14+EGFR
ALBUMIN: 4.2 g/dL (ref 3.5–5.5)
ALK PHOS: 105 IU/L (ref 39–117)
ALT: 24 IU/L (ref 0–32)
AST: 22 IU/L (ref 0–40)
Albumin/Globulin Ratio: 1.6 (ref 1.2–2.2)
BUN / CREAT RATIO: 10 (ref 9–23)
BUN: 8 mg/dL (ref 6–24)
Bilirubin Total: 0.3 mg/dL (ref 0.0–1.2)
CO2: 23 mmol/L (ref 20–29)
CREATININE: 0.8 mg/dL (ref 0.57–1.00)
Calcium: 9.2 mg/dL (ref 8.7–10.2)
Chloride: 102 mmol/L (ref 96–106)
GFR calc non Af Amer: 82 mL/min/{1.73_m2} (ref >59)
GFR, EST AFRICAN AMERICAN: 94 mL/min/{1.73_m2} (ref >59)
GLOBULIN, TOTAL: 2.6 g/dL (ref 1.5–4.5)
Glucose: 97 mg/dL (ref 65–99)
Potassium: 4.4 mmol/L (ref 3.5–5.2)
Sodium: 141 mmol/L (ref 134–144)
Total Protein: 6.8 g/dL (ref 6.0–8.5)

## 2018-02-09 LAB — LIPID PANEL
CHOL/HDL RATIO: 4.2 ratio (ref 0.0–4.4)
Cholesterol, Total: 181 mg/dL (ref 100–199)
HDL: 43 mg/dL (ref >39)
LDL Calculated: 122 mg/dL — ABNORMAL HIGH (ref 0–99)
TRIGLYCERIDES: 80 mg/dL (ref 0–149)
VLDL Cholesterol Cal: 16 mg/dL (ref 5–40)

## 2018-02-09 LAB — THYROID PANEL WITH TSH
FREE THYROXINE INDEX: 2.5 (ref 1.2–4.9)
T3 UPTAKE RATIO: 24 % (ref 24–39)
T4, Total: 10.4 ug/dL (ref 4.5–12.0)
TSH: 3.88 u[IU]/mL (ref 0.450–4.500)

## 2018-06-08 ENCOUNTER — Other Ambulatory Visit: Payer: Self-pay | Admitting: Nurse Practitioner

## 2018-06-08 DIAGNOSIS — Z1231 Encounter for screening mammogram for malignant neoplasm of breast: Secondary | ICD-10-CM

## 2018-07-11 ENCOUNTER — Ambulatory Visit
Admission: RE | Admit: 2018-07-11 | Discharge: 2018-07-11 | Disposition: A | Discharge status: Home / Self Care | Payer: BLUE CROSS/BLUE SHIELD | Source: Ambulatory Visit | Attending: Nurse Practitioner | Admitting: Nurse Practitioner

## 2018-07-11 DIAGNOSIS — Z1231 Encounter for screening mammogram for malignant neoplasm of breast: Secondary | ICD-10-CM

## 2018-08-01 ENCOUNTER — Other Ambulatory Visit: Payer: Self-pay | Admitting: Nurse Practitioner

## 2018-08-01 DIAGNOSIS — E039 Hypothyroidism, unspecified: Secondary | ICD-10-CM

## 2018-08-01 DIAGNOSIS — I10 Essential (primary) hypertension: Secondary | ICD-10-CM

## 2018-08-01 NOTE — Telephone Encounter (Signed)
Last seen 02/08/18  MMM

## 2018-08-08 ENCOUNTER — Encounter: Payer: Self-pay | Admitting: Nurse Practitioner

## 2018-08-08 ENCOUNTER — Ambulatory Visit (INDEPENDENT_AMBULATORY_CARE_PROVIDER_SITE_OTHER): Payer: BLUE CROSS/BLUE SHIELD | Admitting: Nurse Practitioner

## 2018-08-08 ENCOUNTER — Ambulatory Visit (INDEPENDENT_AMBULATORY_CARE_PROVIDER_SITE_OTHER): Payer: BLUE CROSS/BLUE SHIELD

## 2018-08-08 ENCOUNTER — Ambulatory Visit: Payer: BLUE CROSS/BLUE SHIELD | Admitting: Nurse Practitioner

## 2018-08-08 ENCOUNTER — Other Ambulatory Visit: Payer: Self-pay | Admitting: Nurse Practitioner

## 2018-08-08 VITALS — BP 138/80 | HR 82 | Temp 97.0°F | Ht 62.0 in | Wt 164.0 lb

## 2018-08-08 DIAGNOSIS — Z23 Encounter for immunization: Secondary | ICD-10-CM | POA: Diagnosis not present

## 2018-08-08 DIAGNOSIS — E785 Hyperlipidemia, unspecified: Secondary | ICD-10-CM

## 2018-08-08 DIAGNOSIS — I10 Essential (primary) hypertension: Secondary | ICD-10-CM

## 2018-08-08 DIAGNOSIS — E039 Hypothyroidism, unspecified: Secondary | ICD-10-CM | POA: Diagnosis not present

## 2018-08-08 DIAGNOSIS — I6930 Unspecified sequelae of cerebral infarction: Secondary | ICD-10-CM | POA: Diagnosis not present

## 2018-08-08 DIAGNOSIS — Z6830 Body mass index (BMI) 30.0-30.9, adult: Secondary | ICD-10-CM

## 2018-08-08 DIAGNOSIS — Z13820 Encounter for screening for osteoporosis: Secondary | ICD-10-CM

## 2018-08-08 DIAGNOSIS — Z01419 Encounter for gynecological examination (general) (routine) without abnormal findings: Secondary | ICD-10-CM

## 2018-08-08 DIAGNOSIS — L989 Disorder of the skin and subcutaneous tissue, unspecified: Secondary | ICD-10-CM

## 2018-08-08 DIAGNOSIS — Z0000 Encounter for general adult medical examination without abnormal findings: Secondary | ICD-10-CM | POA: Diagnosis not present

## 2018-08-08 DIAGNOSIS — Z780 Asymptomatic menopausal state: Secondary | ICD-10-CM

## 2018-08-08 DIAGNOSIS — M85852 Other specified disorders of bone density and structure, left thigh: Secondary | ICD-10-CM | POA: Diagnosis not present

## 2018-08-08 LAB — URINALYSIS, COMPLETE
Bilirubin, UA: NEGATIVE
Glucose, UA: NEGATIVE
KETONES UA: NEGATIVE
Nitrite, UA: NEGATIVE
PROTEIN UA: NEGATIVE
RBC UA: NEGATIVE
Urobilinogen, Ur: 0.2 mg/dL (ref 0.2–1.0)
pH, UA: 7 (ref 5.0–7.5)

## 2018-08-08 LAB — MICROSCOPIC EXAMINATION: RBC, UA: NONE SEEN /HPF (ref 0–2)

## 2018-08-08 MED ORDER — SIMVASTATIN 40 MG PO TABS: 40.0000 mg | ORAL_TABLET | Freq: Every day | ORAL | 1 refills | Status: DC

## 2018-08-08 MED ORDER — LEVOTHYROXINE SODIUM 75 MCG PO TABS: 75.0000 ug | ORAL_TABLET | Freq: Every day | ORAL | 1 refills | Status: DC

## 2018-08-08 MED ORDER — LOSARTAN POTASSIUM 100 MG PO TABS: 100.0000 mg | ORAL_TABLET | Freq: Every day | ORAL | 1 refills | Status: DC

## 2018-08-08 NOTE — Progress Notes (Signed)
Subjective:    Patient ID: Brooke Newton, female    DOB: Aug 06, 1961, 57 y.o.   MRN: [id number]   Chief Complaint: Annual Exam (Check place on nose)   HPI:  1. Annual physical exam   2. Gynecologic exam normal  last pap was Newton on 04/11/13 and was abnormal- ASCUS.  3. Essential hypertension, benign  No c/o chest pain, sob or headache. Does not check blood pressure at home. BP Readings from Last 3 Encounters:  08/08/18 (!) 173/91  02/08/18 138/82  06/28/17 (!) 162/89     4. Hypothyroidism, unspecified type  No problems that she is aware of.  5. Late effect of cerebrovascular accident  Had stroke in 1983- has no residual effects.  6. Hyperlipidemia with target LDL less than 100  Tries to avoid fatty foods. Takes zocor daily.  7. BMI 30.0-30.9,adult  No recent weight changes    Outpatient Encounter Medications as of 08/08/2018  Medication Sig  . aspirin 325 MG tablet Take 325 mg by mouth daily.  Marland Kitchen levothyroxine (SYNTHROID, LEVOTHROID) 75 MCG tablet TAKE 1 TABLET BY MOUTH  DAILY  . losartan (COZAAR) 100 MG tablet TAKE 1 TABLET BY MOUTH  DAILY  . simvastatin (ZOCOR) 40 MG tablet Take 1 tablet (40 mg total) by mouth daily. (Patient not taking: Reported on 02/08/2018)   No facility-administered encounter medications on file as of 08/08/2018.       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  HENT: Negative.   Eyes: Negative for pain.  Respiratory: Negative for shortness of breath.   Cardiovascular: Negative for chest pain, palpitations and leg swelling.  Gastrointestinal: Negative for abdominal pain.  Endocrine: Negative for polydipsia.  Genitourinary: Negative.   Musculoskeletal: Negative.   Skin: Negative for rash.  Neurological: Negative for dizziness, weakness and headaches.  Hematological: Does not bruise/bleed easily.    Psychiatric/Behavioral: Negative.   All other systems reviewed and are negative.      Objective:   Physical Exam  Constitutional: She is oriented to person, place, and time. She appears well-developed and well-nourished. No distress.  HENT:  Head: Normocephalic.  Nose: Nose normal.  Mouth/Throat: Oropharynx is clear and moist.  Eyes: Pupils are equal, round, and reactive to light. EOM are normal.  Neck: Normal range of motion. Neck supple. No JVD present. Carotid bruit is not present.  Cardiovascular: Normal rate, regular rhythm, normal heart sounds and intact distal pulses.  Pulmonary/Chest: Effort normal and breath sounds normal. No respiratory distress. She has no wheezes. She has no rales. She exhibits no tenderness.  Abdominal: Soft. Normal appearance, normal aorta and bowel sounds are normal. She exhibits no distension, no abdominal bruit, no pulsatile midline mass and no mass. There is no splenomegaly or hepatomegaly. There is no tenderness.  Genitourinary: Vagina normal and uterus normal.  Genitourinary Comments: Cervix nonparous and pink No adnexal masses or tenderness  Musculoskeletal: Normal range of motion. She exhibits no edema.  Lymphadenopathy:    She has no cervical adenopathy.  Neurological: She is alert and oriented to person, place, and time. She has normal reflexes.  Skin: Skin is warm and dry.  1cm annular slightly raised flesh colored lesion on right bridge of nose  Psychiatric: She has a normal mood and affect. Her behavior is normal. Judgment and thought content normal.  Nursing note and vitals reviewed.  BP 138/80 (BP Location: Left  Arm, Cuff Size: Normal)   Pulse 82   Temp (!) 97 F (36.1 C) (Oral)   Ht '5\' 2"'  (1.575 m)   Wt 164 lb (74.4 kg)   BMI 30.00 kg/m  EKG- NSR-Brooke Hassell Done, FNP Chest xray- no acute or chronic abnormalities-Preliminary reading by Ronnald Collum, FNP  [facility]      Assessment & Plan:  Brooke Newton comes in today with  chief complaint of Annual Exam (Check place on nose)   Diagnosis and orders addressed:  1. Annual physical exam - CBC with Differential/Platelet  2. Gynecologic exam normal - IGP, Aptima HPV, rfx 16/18,45  3. Essential hypertension, benign Low sodium diet - CMP14+EGFR - DG Chest 2 View; Future - EKG 12-Lead - losartan (COZAAR) 100 MG tablet; Take 1 tablet (100 mg total) by mouth daily.  Dispense: 90 tablet; Refill: 1  4. Hypothyroidism, unspecified type - Thyroid Panel With TSH - levothyroxine (SYNTHROID, LEVOTHROID) 75 MCG tablet; Take 1 tablet (75 mcg total) by mouth daily.  Dispense: 90 tablet; Refill: 1  5. Late effect of cerebrovascular accident  6. Hyperlipidemia with target LDL less than 100 Low fat diet - Lipid panel - simvastatin (ZOCOR) 40 MG tablet; Take 1 tablet (40 mg total) by mouth daily.  Dispense: 90 tablet; Refill: 1  7. BMI 30.0-30.9,adult Discussed diet and exercise for person with BMI >25 Will recheck weight in 3-6 months  8. External nasal lesion Do not pick or scratch at area - Ambulatory referral to Dermatology  9. Screening for osteoporosis Weight bearing exercises encouraged - DG [facility] DEXA   Labs pending Health Maintenance reviewed Diet and exercise encouraged  Follow up plan: 6 months   [place], FNP

## 2018-08-08 NOTE — Patient Instructions (Signed)

## 2018-08-08 NOTE — Addendum Note (Signed)
Addended by: Cleda DaubUCKER, AMANDA G on: 08/08/2018 12:44 PM   Modules accepted: Orders

## 2018-08-09 ENCOUNTER — Telehealth: Payer: Self-pay | Admitting: Nurse Practitioner

## 2018-08-09 LAB — CBC WITH DIFFERENTIAL/PLATELET
Basophils Absolute: 0 10*3/uL (ref 0.0–0.2)
Basos: 0 %
EOS (ABSOLUTE): 0.1 10*3/uL (ref 0.0–0.4)
EOS: 1 %
HEMATOCRIT: 41.6 % (ref 34.0–46.6)
Hemoglobin: 13.9 g/dL (ref 11.1–15.9)
IMMATURE GRANULOCYTES: 0 %
Immature Grans (Abs): 0 10*3/uL (ref 0.0–0.1)
Lymphocytes Absolute: 1.4 10*3/uL (ref 0.7–3.1)
Lymphs: 16 %
MCH: 30.2 pg (ref 26.6–33.0)
MCHC: 33.4 g/dL (ref 31.5–35.7)
MCV: 90 fL (ref 79–97)
MONOS ABS: 0.3 10*3/uL (ref 0.1–0.9)
Monocytes: 4 %
NEUTROS PCT: 79 %
Neutrophils Absolute: 6.8 10*3/uL (ref 1.4–7.0)
Platelets: 307 10*3/uL (ref 150–450)
RBC: 4.6 x10E6/uL (ref 3.77–5.28)
RDW: 15 % (ref 12.3–15.4)
WBC: 8.7 10*3/uL (ref 3.4–10.8)

## 2018-08-09 LAB — THYROID PANEL WITH TSH
FREE THYROXINE INDEX: 2.9 (ref 1.2–4.9)
T3 UPTAKE RATIO: 26 % (ref 24–39)
T4 TOTAL: 11 ug/dL (ref 4.5–12.0)
TSH: 3.89 u[IU]/mL (ref 0.450–4.500)

## 2018-08-09 LAB — CMP14+EGFR
A/G RATIO: 1.5 (ref 1.2–2.2)
ALBUMIN: 4.5 g/dL (ref 3.5–5.5)
ALT: 16 IU/L (ref 0–32)
AST: 21 IU/L (ref 0–40)
Alkaline Phosphatase: 102 IU/L (ref 39–117)
BILIRUBIN TOTAL: 0.5 mg/dL (ref 0.0–1.2)
BUN / CREAT RATIO: 10 (ref 9–23)
BUN: 8 mg/dL (ref 6–24)
CHLORIDE: 105 mmol/L (ref 96–106)
CO2: 19 mmol/L — ABNORMAL LOW (ref 20–29)
Calcium: 10.2 mg/dL (ref 8.7–10.2)
Creatinine, Ser: 0.78 mg/dL (ref 0.57–1.00)
GFR calc non Af Amer: 83 mL/min/{1.73_m2} (ref >59)
GFR, EST AFRICAN AMERICAN: 96 mL/min/{1.73_m2} (ref >59)
GLUCOSE: 95 mg/dL (ref 65–99)
Globulin, Total: 3.1 g/dL (ref 1.5–4.5)
POTASSIUM: 4.3 mmol/L (ref 3.5–5.2)
SODIUM: 141 mmol/L (ref 134–144)
TOTAL PROTEIN: 7.6 g/dL (ref 6.0–8.5)

## 2018-08-09 LAB — LIPID PANEL
CHOLESTEROL TOTAL: 209 mg/dL — AB (ref 100–199)
Chol/HDL Ratio: 3.9 ratio (ref 0.0–4.4)
HDL: 54 mg/dL (ref >39)
LDL Calculated: 137 mg/dL — ABNORMAL HIGH (ref 0–99)
Triglycerides: 90 mg/dL (ref 0–149)
VLDL Cholesterol Cal: 18 mg/dL (ref 5–40)

## 2018-08-09 NOTE — Telephone Encounter (Signed)
FYI for provider

## 2018-08-09 NOTE — Telephone Encounter (Signed)
Ok

## 2018-08-12 LAB — IGP, APTIMA HPV, RFX 16/18,45
HPV Aptima: NEGATIVE
PAP Smear Comment: 0

## 2018-10-18 ENCOUNTER — Ambulatory Visit (INDEPENDENT_AMBULATORY_CARE_PROVIDER_SITE_OTHER): Payer: BLUE CROSS/BLUE SHIELD

## 2018-10-18 DIAGNOSIS — Z23 Encounter for immunization: Secondary | ICD-10-CM | POA: Diagnosis not present

## 2019-01-10 DIAGNOSIS — D485 Neoplasm of uncertain behavior of skin: Secondary | ICD-10-CM | POA: Diagnosis not present

## 2019-01-10 DIAGNOSIS — C44311 Basal cell carcinoma of skin of nose: Secondary | ICD-10-CM | POA: Diagnosis not present

## 2019-02-14 ENCOUNTER — Ambulatory Visit: Payer: BLUE CROSS/BLUE SHIELD | Admitting: Nurse Practitioner

## 2019-02-25 ENCOUNTER — Other Ambulatory Visit: Payer: Self-pay | Admitting: Nurse Practitioner

## 2019-02-25 DIAGNOSIS — E039 Hypothyroidism, unspecified: Secondary | ICD-10-CM

## 2019-02-25 DIAGNOSIS — I10 Essential (primary) hypertension: Secondary | ICD-10-CM

## 2019-02-27 ENCOUNTER — Encounter: Payer: Self-pay | Admitting: Nurse Practitioner

## 2019-02-27 ENCOUNTER — Ambulatory Visit: Payer: BLUE CROSS/BLUE SHIELD | Admitting: Nurse Practitioner

## 2019-02-27 VITALS — BP 138/82 | HR 76 | Temp 97.1°F | Ht 62.0 in | Wt 168.0 lb

## 2019-02-27 DIAGNOSIS — I6930 Unspecified sequelae of cerebral infarction: Secondary | ICD-10-CM

## 2019-02-27 DIAGNOSIS — Z6830 Body mass index (BMI) 30.0-30.9, adult: Secondary | ICD-10-CM

## 2019-02-27 DIAGNOSIS — E785 Hyperlipidemia, unspecified: Secondary | ICD-10-CM

## 2019-02-27 DIAGNOSIS — I10 Essential (primary) hypertension: Secondary | ICD-10-CM

## 2019-02-27 DIAGNOSIS — E039 Hypothyroidism, unspecified: Secondary | ICD-10-CM | POA: Diagnosis not present

## 2019-02-27 LAB — CMP14+EGFR
A/G RATIO: 1.6 (ref 1.2–2.2)
ALK PHOS: 101 IU/L (ref 39–117)
ALT: 20 IU/L (ref 0–32)
AST: 16 IU/L (ref 0–40)
Albumin: 4.3 g/dL (ref 3.8–4.9)
BILIRUBIN TOTAL: 0.3 mg/dL (ref 0.0–1.2)
BUN/Creatinine Ratio: 15 (ref 9–23)
BUN: 13 mg/dL (ref 6–24)
CO2: 22 mmol/L (ref 20–29)
Calcium: 9.6 mg/dL (ref 8.7–10.2)
Chloride: 103 mmol/L (ref 96–106)
Creatinine, Ser: 0.89 mg/dL (ref 0.57–1.00)
GFR calc Af Amer: 82 mL/min/{1.73_m2} (ref >59)
GFR calc non Af Amer: 71 mL/min/{1.73_m2} (ref >59)
GLUCOSE: 99 mg/dL (ref 65–99)
Globulin, Total: 2.7 g/dL (ref 1.5–4.5)
POTASSIUM: 4.6 mmol/L (ref 3.5–5.2)
Sodium: 140 mmol/L (ref 134–144)
TOTAL PROTEIN: 7 g/dL (ref 6.0–8.5)

## 2019-02-27 LAB — LIPID PANEL
Chol/HDL Ratio: 3.9 ratio (ref 0.0–4.4)
Cholesterol, Total: 207 mg/dL — ABNORMAL HIGH (ref 100–199)
HDL: 53 mg/dL (ref >39)
LDL Calculated: 137 mg/dL — ABNORMAL HIGH (ref 0–99)
TRIGLYCERIDES: 84 mg/dL (ref 0–149)
VLDL Cholesterol Cal: 17 mg/dL (ref 5–40)

## 2019-02-27 MED ORDER — SIMVASTATIN 40 MG PO TABS: 40.0000 mg | ORAL_TABLET | Freq: Every day | ORAL | 1 refills | Status: DC

## 2019-02-27 MED ORDER — LOSARTAN POTASSIUM 100 MG PO TABS: 100.0000 mg | ORAL_TABLET | Freq: Every day | ORAL | 1 refills | Status: DC

## 2019-02-27 MED ORDER — LEVOTHYROXINE SODIUM 75 MCG PO TABS: 75.0000 ug | ORAL_TABLET | Freq: Every day | ORAL | 1 refills | Status: DC

## 2019-02-27 NOTE — Patient Instructions (Signed)
Health Maintenance After Age 60 After age 60, you are at a higher risk for certain long-term diseases and infections as well as injuries from falls. Falls are a major cause of broken bones and head injuries in people who are older than age 60. Getting regular preventive care can help to keep you healthy and well. Preventive care includes getting regular testing and making lifestyle changes as recommended by your health care provider. Talk with your health care provider about:  Which screenings and tests you should have. A screening is a test that checks for a disease when you have no symptoms.  A diet and exercise plan that is right for you. What should I know about screenings and tests to prevent falls? Screening and testing are the best ways to find a health problem early. Early diagnosis and treatment give you the best chance of managing medical conditions that are common after age 60. Certain conditions and lifestyle choices may make you more likely to have a fall. Your health care provider may recommend:  Regular vision checks. Poor vision and conditions such as cataracts can make you more likely to have a fall. If you wear glasses, make sure to get your prescription updated if your vision changes.  Medicine review. Work with your health care provider to regularly review all of the medicines you are taking, including over-the-counter medicines. Ask your health care provider about any side effects that may make you more likely to have a fall. Tell your health care provider if any medicines that you take make you feel dizzy or sleepy.  Osteoporosis screening. Osteoporosis is a condition that causes the bones to get weaker. This can make the bones weak and cause them to break more easily.  Blood pressure screening. Blood pressure changes and medicines to control blood pressure can make you feel dizzy.  Strength and balance checks. Your health care provider may recommend certain tests to check your  strength and balance while standing, walking, or changing positions.  Foot health exam. Foot pain and numbness, as well as not wearing proper footwear, can make you more likely to have a fall.  Depression screening. You may be more likely to have a fall if you have a fear of falling, feel emotionally low, or feel unable to do activities that you used to do.  Alcohol use screening. Using too much alcohol can affect your balance and may make you more likely to have a fall. What actions can I take to lower my risk of falls? General instructions  Talk with your health care provider about your risks for falling. Tell your health care provider if: ? You fall. Be sure to tell your health care provider about all falls, even ones that seem minor. ? You feel dizzy, sleepy, or off-balance.  Take over-the-counter and prescription medicines only as told by your health care provider. These include any supplements.  Eat a healthy diet and maintain a healthy weight. A healthy diet includes low-fat dairy products, low-fat (lean) meats, and fiber from whole grains, beans, and lots of fruits and vegetables. Home safety  Remove any tripping hazards, such as rugs, cords, and clutter.  Install safety equipment such as grab bars in bathrooms and safety rails on stairs.  Keep rooms and walkways well-lit. Activity   Follow a regular exercise program to stay fit. This will help you maintain your balance. Ask your health care provider what types of exercise are appropriate for you.  If you need a cane or   walker, use it as recommended by your health care provider.  Wear supportive shoes that have nonskid soles. Lifestyle  Do not drink alcohol if your health care provider tells you not to drink.  If you drink alcohol, limit how much you have: ? 0-1 drink a day for women. ? 0-2 drinks a day for men.  Be aware of how much alcohol is in your drink. In the [place] one drink equals one typical bottle of beer (12  oz), one-half glass of wine (5 oz), or one shot of hard liquor (1 oz).  Do not use any products that contain nicotine or tobacco, such as cigarettes and e-cigarettes. If you need help quitting, ask your health care provider. Summary  Having a healthy lifestyle and getting preventive care can help to protect your health and wellness after age 60.  Screening and testing are the best way to find a health problem early and help you avoid having a fall. Early diagnosis and treatment give you the best chance for managing medical conditions that are more common for people who are older than age 60.  Falls are a major cause of broken bones and head injuries in people who are older than age 60. Take precautions to prevent a fall at home.  Work with your health care provider to learn what changes you can make to improve your health and wellness and to prevent falls. This information is not intended to replace advice given to you by your health care provider. Make sure you discuss any questions you have with your health care provider. Document Released: 10/20/2017 Document Revised: 10/20/2017 Document Reviewed: 10/20/2017 Elsevier Interactive Patient Education  2019 [employer]  

## 2019-02-27 NOTE — Progress Notes (Signed)
Subjective:    Patient ID: Brooke Newton, female    DOB: 19-Sep-1961, 58 y.o.   MRN: [id number]   Chief Complaint: No chief complaint on file.   HPI:  1. Essential hypertension, benign  No c/o chest pain, sob or headache. Does not check blood pressure at home. BP Readings from Last 3 Encounters:  08/08/18 138/80  02/08/18 138/82  06/28/17 (!) 162/89     2. Hyperlipidemia with target LDL less than 100  Does try to watch diet. But very seldom does exercise.  3. Late effect of cerebrovascular accident  Has recovered 100% from her stroke. Denies any unilateral weakness  4. Hypothyroidism, unspecified type  No problems that she is aware of.  5. BMI 30.0-30.9,adult  No recent weight changes    Outpatient Encounter Medications as of 02/27/2019  Medication Sig  . aspirin 325 MG tablet Take 325 mg by mouth daily.  Marland Kitchen levothyroxine (SYNTHROID, LEVOTHROID) 75 MCG tablet Take 1 tablet (75 mcg total) by mouth daily.  Marland Kitchen losartan (COZAAR) 100 MG tablet Take 1 tablet (100 mg total) by mouth daily.  . simvastatin (ZOCOR) 40 MG tablet Take 1 tablet (40 mg total) by mouth daily.       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  HENT: Negative.   Eyes: Negative for pain.  Respiratory: Negative for shortness of breath.   Cardiovascular: Negative for chest pain, palpitations and leg swelling.  Gastrointestinal: Negative for abdominal pain.  Endocrine: Negative for polydipsia.  Genitourinary: Negative.   Skin: Negative for rash.  Neurological: Negative for dizziness, weakness and headaches.  Hematological: Does not bruise/bleed easily.  Psychiatric/Behavioral: Negative.   All other systems reviewed and are negative.      Objective:   Physical Exam Vitals signs and nursing note reviewed.  Constitutional:      General: She is not in acute distress.  Appearance: Normal appearance. She is well-developed.  HENT:     Head: Normocephalic.     Nose: Nose normal.  Eyes:     Pupils: Pupils are equal, round, and reactive to light.  Neck:     Musculoskeletal: Normal range of motion and neck supple.     Vascular: No carotid bruit or JVD.  Cardiovascular:     Rate and Rhythm: Normal rate and regular rhythm.     Heart sounds: Normal heart sounds.  Pulmonary:     Effort: Pulmonary effort is normal. No respiratory distress.     Breath sounds: Normal breath sounds. No wheezing or rales.  Chest:     Chest wall: No tenderness.  Abdominal:     General: Bowel sounds are normal. There is no distension or abdominal bruit.     Palpations: Abdomen is soft. There is no hepatomegaly, splenomegaly, mass or pulsatile mass.     Tenderness: There is no abdominal tenderness.  Musculoskeletal: Normal range of motion.  Lymphadenopathy:     Cervical: No cervical adenopathy.  Skin:    General: Skin is warm and dry.  Neurological:     Mental Status: She is alert and oriented to person, place, and time.     Deep Tendon Reflexes: Reflexes are normal and symmetric.  Psychiatric:        Behavior: Behavior normal.        Thought Content: Thought content normal.        Judgment: Judgment normal.  BP 138/82 (BP Location: Right Leg, Cuff Size: Normal)   Pulse 76   Temp (!) 97.1 F (36.2 C) (Oral)   Ht '5\' 2"'  (1.575 m)   Wt 168 lb (76.2 kg)   BMI 30.73 kg/m         Assessment & Plan:  Brooke Newton comes in today with chief complaint of Medical Management of Chronic Issues   Diagnosis and orders addressed:  1. Essential hypertension, benign Low sodium diet - losartan (COZAAR) 100 MG tablet; Take 1 tablet (100 mg total) by mouth daily.  Dispense: 90 tablet; Refill: 1 - CMP14+EGFR  2. Hyperlipidemia with target LDL less than 100 Low fat diet - simvastatin (ZOCOR) 40 MG tablet; Take 1 tablet (40 mg total) by mouth daily.  Dispense: 90 tablet;  Refill: 1 - Lipid panel  3. Late effect of cerebrovascular accident  4. Hypothyroidism, unspecified type - levothyroxine (SYNTHROID, LEVOTHROID) 75 MCG tablet; Take 1 tablet (75 mcg total) by mouth daily.  Dispense: 90 tablet; Refill: 1  5. BMI 30.0-30.9,adult Discussed diet and exercise for person with BMI >25 Will recheck weight in 3-6 months    Labs pending Health Maintenance reviewed Diet and exercise encouraged  Follow up plan: 6 mpnths   Mary-Margaret Hassell Done, FNP

## 2019-03-06 DIAGNOSIS — C44311 Basal cell carcinoma of skin of nose: Secondary | ICD-10-CM | POA: Diagnosis not present

## 2019-05-30 ENCOUNTER — Other Ambulatory Visit: Payer: Self-pay

## 2019-05-30 ENCOUNTER — Ambulatory Visit (INDEPENDENT_AMBULATORY_CARE_PROVIDER_SITE_OTHER): Payer: BC Managed Care – PPO | Admitting: Nurse Practitioner

## 2019-05-30 ENCOUNTER — Encounter: Payer: Self-pay | Admitting: Nurse Practitioner

## 2019-05-30 DIAGNOSIS — I6930 Unspecified sequelae of cerebral infarction: Secondary | ICD-10-CM

## 2019-05-30 DIAGNOSIS — E785 Hyperlipidemia, unspecified: Secondary | ICD-10-CM

## 2019-05-30 DIAGNOSIS — E039 Hypothyroidism, unspecified: Secondary | ICD-10-CM

## 2019-05-30 DIAGNOSIS — I10 Essential (primary) hypertension: Secondary | ICD-10-CM | POA: Diagnosis not present

## 2019-05-30 DIAGNOSIS — Z6830 Body mass index (BMI) 30.0-30.9, adult: Secondary | ICD-10-CM

## 2019-05-30 MED ORDER — LOSARTAN POTASSIUM 100 MG PO TABS: 100.0000 mg | ORAL_TABLET | Freq: Every day | ORAL | 1 refills | Status: DC

## 2019-05-30 MED ORDER — LEVOTHYROXINE SODIUM 75 MCG PO TABS: 75.0000 ug | ORAL_TABLET | Freq: Every day | ORAL | 1 refills | Status: DC

## 2019-05-30 MED ORDER — SIMVASTATIN 40 MG PO TABS: 40.0000 mg | ORAL_TABLET | Freq: Every day | ORAL | 1 refills | Status: DC

## 2019-05-30 NOTE — Progress Notes (Signed)
Virtual Visit via telephone Note  I connected with Brooke Newton on 05/30/19 at 11:10 AM by video and verified that I am speaking with the correct person using two identifiers. Brooke Newton is currently located at home and no one is currently with her during visit. The provider, Mary-Margaret Hassell Done, FNP is located in their office at time of visit.  I discussed the limitations, risks, security and privacy concerns of performing an evaluation and management service by telephone and the availability of in person appointments. I also discussed with the patient that there may be a patient responsible charge related to this service. The patient expressed understanding and agreed to proceed.   History and Present Illness:   Chief Complaint: No chief complaint on file.    HPI:  1. Essential hypertension, benign No c/o chest pain, sob or headache. Does not check blood pressure at home. BP Readings from Last 3 Encounters:  02/27/19 138/82  08/08/18 138/80  02/08/18 138/82     2. Hyperlipidemia with target LDL less than 100 Tries to avoid most fried foods. Has been walking daily  3. Late effect of cerebrovascular accident Has no residual effects from stroke.  4. Hypothyroidism, unspecified type No problems  5. BMI 30.0-30.9,adult No weight changes    Outpatient Encounter Medications as of 05/30/2019  Medication Sig  . aspirin 325 MG tablet Take 325 mg by mouth daily.  Marland Kitchen levothyroxine (SYNTHROID, LEVOTHROID) 75 MCG tablet Take 1 tablet (75 mcg total) by mouth daily.  Marland Kitchen losartan (COZAAR) 100 MG tablet Take 1 tablet (100 mg total) by mouth daily.  . simvastatin (ZOCOR) 40 MG tablet Take 1 tablet (40 mg total) by mouth daily.     Past Surgical History:  Procedure Laterality Date  . CESAREAN SECTION      Family History  Problem Relation Age of Onset  . Hypertension Mother   . Stroke Mother   . 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     Review of Systems  Constitutional: Negative for diaphoresis and weight loss.  Eyes: Negative for blurred vision, double vision and pain.  Respiratory: Negative for shortness of breath.   Cardiovascular: Negative for chest pain, palpitations, orthopnea and leg swelling.  Gastrointestinal: Negative for abdominal pain.  Skin: Negative for rash.  Neurological: Negative for dizziness, sensory change, loss of consciousness, weakness and headaches.  Endo/Heme/Allergies: Negative for polydipsia. Does not bruise/bleed easily.  Psychiatric/Behavioral: Negative for memory loss. The patient does not have insomnia.   All other systems reviewed and are negative.      Observations/Objective: Alert and oriented- answers all questions appropriately No distress  Assessment and Plan: Brooke Newton comes in today with chief complaint of No chief complaint on file.   Diagnosis and orders addressed:  1. Essential hypertension, benign Low sodium diet - losartan (COZAAR) 100 MG tablet; Take 1 tablet (100 mg total) by mouth daily.  Dispense: 90 tablet; Refill: 1  2. Hyperlipidemia with target LDL less than 100 Low fat diet - simvastatin (ZOCOR) 40 MG tablet; Take 1 tablet (40 mg total) by mouth daily.  Dispense: 90 tablet; Refill: 1  3. Late effect of cerebrovascular accident  4. Hypothyroidism, unspecified type - levothyroxine (SYNTHROID) 75 MCG tablet; Take 1 tablet (75 mcg total) by mouth daily.  Dispense: 90 tablet; Refill: 1  5. BMI 30.0-30.9,adult Discussed diet and exercise for person with BMI >25 Will recheck  weight in 3-6 months   Previous lab results reviewed Health Maintenance reviewed Diet and exercise encouraged  Follow up plan: 3 month    I discussed the assessment and treatment plan with the patient. The patient was provided an opportunity to ask questions and all were answered. The patient agreed with the plan and  demonstrated an understanding of the instructions.   The patient was advised to call back or seek an in-person evaluation if the symptoms worsen or if the condition fails to improve as anticipated.  The above assessment and management plan was discussed with the patient. The patient verbalized understanding of and has agreed to the management plan. Patient is aware to call the clinic if symptoms persist or worsen. Patient is aware when to return to the clinic for a follow-up visit. Patient educated on when it is appropriate to go to the emergency department.   Time call ended:11:25  I provided 15 minutes of face-to-face time during this encounter.    Mary-Margaret Daphine DeutscherMartin, FNP

## 2019-07-21 DIAGNOSIS — Z1231 Encounter for screening mammogram for malignant neoplasm of breast: Secondary | ICD-10-CM | POA: Diagnosis not present

## 2019-09-04 ENCOUNTER — Ambulatory Visit: Payer: Self-pay | Admitting: Nurse Practitioner

## 2019-09-26 ENCOUNTER — Other Ambulatory Visit: Payer: Self-pay

## 2019-09-26 ENCOUNTER — Encounter: Payer: Self-pay | Admitting: Nurse Practitioner

## 2019-09-26 ENCOUNTER — Ambulatory Visit (INDEPENDENT_AMBULATORY_CARE_PROVIDER_SITE_OTHER): Payer: BC Managed Care – PPO | Admitting: Nurse Practitioner

## 2019-09-26 VITALS — BP 136/82 | HR 80 | Temp 97.3°F | Resp 16 | Ht 62.0 in | Wt 169.0 lb

## 2019-09-26 DIAGNOSIS — Z6830 Body mass index (BMI) 30.0-30.9, adult: Secondary | ICD-10-CM

## 2019-09-26 DIAGNOSIS — Z23 Encounter for immunization: Secondary | ICD-10-CM

## 2019-09-26 DIAGNOSIS — E039 Hypothyroidism, unspecified: Secondary | ICD-10-CM

## 2019-09-26 DIAGNOSIS — I10 Essential (primary) hypertension: Secondary | ICD-10-CM

## 2019-09-26 DIAGNOSIS — I6930 Unspecified sequelae of cerebral infarction: Secondary | ICD-10-CM

## 2019-09-26 DIAGNOSIS — E785 Hyperlipidemia, unspecified: Secondary | ICD-10-CM | POA: Diagnosis not present

## 2019-09-26 MED ORDER — LEVOTHYROXINE SODIUM 75 MCG PO TABS: 75.0000 ug | ORAL_TABLET | Freq: Every day | ORAL | 1 refills | Status: DC

## 2019-09-26 MED ORDER — LOSARTAN POTASSIUM 100 MG PO TABS: 100.0000 mg | ORAL_TABLET | Freq: Every day | ORAL | 1 refills | Status: DC

## 2019-09-26 NOTE — Progress Notes (Signed)
Subjective:    Patient ID: Brooke Newton, female    DOB: 1961-09-02, 58 y.o.   MRN: [id number]   Chief Complaint: Medical Management of Chronic Issues    HPI:  1. Essential hypertension, benign No c/o chest pain, sob or headache. Does not check blood pressure at home. She was almost in a car accident on way here and is nervous BP Readings from Last 3 Encounters:  09/26/19 (!) 173/95  02/27/19 138/82  08/08/18 138/80     2. Hyperlipidemia with target LDL less than 100 Has been trying to watch diet and doing occasional exercise. Lab Results  Component Value Date   CHOL 207 (H) 02/27/2019   HDL 53 02/27/2019   LDLCALC 137 (H) 02/27/2019   TRIG 84 02/27/2019   CHOLHDL 3.9 02/27/2019     3. Hypothyroidism, unspecified type No problems that she is aware of. Lab Results  Component Value Date   TSH 3.890 08/08/2018     4. Late effect of cerebrovascular accident Had stroke in 2014 and is doing well. No residual effect snow that sh eis aware of.  5. BMI 30.0-30.9,adult No recent weight changes. Wt Readings from Last 3 Encounters:  09/26/19 169 lb (76.7 kg)  02/27/19 168 lb (76.2 kg)  08/08/18 164 lb (74.4 kg)   BMI Readings from Last 3 Encounters:  09/26/19 30.91 kg/m  02/27/19 30.73 kg/m  08/08/18 30.00 kg/m        Outpatient Encounter Medications as of 09/26/2019  Medication Sig  . aspirin 325 MG tablet Take 325 mg by mouth daily.  Marland Kitchen levothyroxine (SYNTHROID) 75 MCG tablet Take 1 tablet (75 mcg total) by mouth daily.  Marland Kitchen losartan (COZAAR) 100 MG tablet Take 1 tablet (100 mg total) by mouth daily.    Past Surgical History:  Procedure Laterality Date  . CESAREAN SECTION      Family History  Problem Relation Age of Onset  . Hypertension Mother   . Stroke Mother   . Hypertension Brother     New complaints: None today  Social history: Lives with husband of 33 years  Controlled substance contract: n/a    Review of Systems  Constitutional:  Negative for activity change and appetite change.  HENT: Negative.   Eyes: Negative for pain.  Respiratory: Negative for shortness of breath.   Cardiovascular: Negative for chest pain, palpitations and leg swelling.  Gastrointestinal: Negative for abdominal pain.  Endocrine: Negative for polydipsia.  Genitourinary: Negative.   Skin: Negative for rash.  Neurological: Negative for dizziness, weakness and headaches.  Hematological: Does not bruise/bleed easily.  Psychiatric/Behavioral: Negative.   All other systems reviewed and are negative.      Objective:   Physical Exam Vitals signs and nursing note reviewed.  Constitutional:      General: She is not in acute distress.    Appearance: Normal appearance. She is well-developed.  HENT:     Head: Normocephalic.     Nose: Nose normal.  Eyes:     Pupils: Pupils are equal, round, and reactive to light.  Neck:     Musculoskeletal: Normal range of motion and neck supple.     Vascular: No carotid bruit or JVD.  Cardiovascular:     Rate and Rhythm: Normal rate and regular rhythm.     Heart sounds: Normal heart sounds.  Pulmonary:     Effort: Pulmonary effort is normal. No respiratory distress.     Breath sounds: Normal breath sounds. No wheezing or rales.  Chest:  Chest wall: No tenderness.  Abdominal:     General: Bowel sounds are normal. There is no distension or abdominal bruit.     Palpations: Abdomen is soft. There is no hepatomegaly, splenomegaly, mass or pulsatile mass.     Tenderness: There is no abdominal tenderness.  Musculoskeletal: Normal range of motion.  Lymphadenopathy:     Cervical: No cervical adenopathy.  Skin:    General: Skin is warm and dry.  Neurological:     Mental Status: She is alert and oriented to person, place, and time.     Deep Tendon Reflexes: Reflexes are normal and symmetric.  Psychiatric:        Behavior: Behavior normal.        Thought Content: Thought content normal.        Judgment:  Judgment normal.    BP 136/82 (BP Location: Left Arm)   Pulse 80   Temp (!) 97.3 F (36.3 C) (Temporal)   Resp 16   Ht _0  (1.575 m)   Wt 169 lb (76.7 kg)   SpO2 98%   BMI 30.91 kg/m          Assessment & Plan:  Brooke Newton comes in today with chief complaint of Medical Management of Chronic Issues   Diagnosis and orders addressed:  1. Essential hypertension, benign Low sodium diet - losartan (COZAAR) 100 MG tablet; Take 1 tablet (100 mg total) by mouth daily.  Dispense: 90 tablet; Refill: 1 - CMP14+EGFR  2. Hyperlipidemia with target LDL less than 100 Low fat diet - Lipid panel  3. Hypothyroidism, unspecified type Labs pending - levothyroxine (SYNTHROID) 75 MCG tablet; Take 1 tablet (75 mcg total) by mouth daily.  Dispense: 90 tablet; Refill: 1 - Thyroid Panel With TSH  4. Late effect of cerebrovascular accident   5. BMI 30.0-30.9,adult Discussed diet and exercise for person with BMI >25 Will recheck weight in 3-6 months   Labs pending Health Maintenance reviewed Diet and exercise encouraged  Follow up plan: 6 months   Brooke Hassell Done, FNP

## 2019-09-26 NOTE — Patient Instructions (Signed)
Exercising to Stay Healthy To become healthy and stay healthy, it is recommended that you do moderate-intensity and vigorous-intensity exercise. You can tell that you are exercising at a moderate intensity if your heart starts beating faster and you start breathing faster but can still hold a conversation. You can tell that you are exercising at a vigorous intensity if you are breathing much harder and faster and cannot hold a conversation while exercising. Exercising regularly is important. It has many health benefits, such as:  Improving overall fitness, flexibility, and endurance.  Increasing bone density.  Helping with weight control.  Decreasing body fat.  Increasing muscle strength.  Reducing stress and tension.  Improving overall health. How often should I exercise? Choose an activity that you enjoy, and set realistic goals. Your health care provider can help you make an activity plan that works for you. Exercise regularly as told by your health care provider. This may include:  Doing strength training two times a week, such as: ? Lifting weights. ? Using resistance bands. ? Push-ups. ? Sit-ups. ? Yoga.  Doing a certain intensity of exercise for a given amount of time. Choose from these options: ? A total of 150 minutes of moderate-intensity exercise every week. ? A total of 75 minutes of vigorous-intensity exercise every week. ? A mix of moderate-intensity and vigorous-intensity exercise every week. Children, pregnant women, people who have not exercised regularly, people who are overweight, and older adults may need to talk with a health care provider about what activities are safe to do. If you have a medical condition, be sure to talk with your health care provider before you start a new exercise program. What are some exercise ideas? Moderate-intensity exercise ideas include:  Walking 1 mile (1.6 km) in about 15 minutes.  Biking.  Hiking.  Golfing.  Dancing.   Water aerobics. Vigorous-intensity exercise ideas include:  Walking 4.5 miles (7.2 km) or more in about 1 hour.  Jogging or running 5 miles (8 km) in about 1 hour.  Biking 10 miles (16.1 km) or more in about 1 hour.  Lap swimming.  Roller-skating or in-line skating.  Cross-country skiing.  Vigorous competitive sports, such as football, basketball, and soccer.  Jumping rope.  Aerobic dancing. What are some everyday activities that can help me to get exercise?  Yard work, such as: ? Pushing a lawn mower. ? Raking and bagging leaves.  Washing your car.  Pushing a stroller.  Shoveling snow.  Gardening.  Washing windows or floors. How can I be more active in my day-to-day activities?  Use stairs instead of an elevator.  Take a walk during your lunch break.  If you drive, park your car farther away from your work or school.  If you take public transportation, get off one stop early and walk the rest of the way.  Stand up or walk around during all of your indoor phone calls.  Get up, stretch, and walk around every 30 minutes throughout the day.  Enjoy exercise with a friend. Support to continue exercising will help you keep a regular routine of activity. What guidelines can I follow while exercising?  Before you start a new exercise program, talk with your health care provider.  Do not exercise so much that you hurt yourself, feel dizzy, or get very short of breath.  Wear comfortable clothes and wear shoes with good support.  Drink plenty of water while you exercise to prevent dehydration or heat stroke.  Work out until your breathing   and your heartbeat get faster. Where to find more information  [place] Department of Health and Human Services: www.hhs.gov  Centers for Disease Control and Prevention (CDC): www.cdc.gov Summary  Exercising regularly is important. It will improve your overall fitness, flexibility, and endurance.  Regular exercise also will  improve your overall health. It can help you control your weight, reduce stress, and improve your bone density.  Do not exercise so much that you hurt yourself, feel dizzy, or get very short of breath.  Before you start a new exercise program, talk with your health care provider. This information is not intended to replace advice given to you by your health care provider. Make sure you discuss any questions you have with your health care provider. Document Released: 01/09/2011 Document Revised: 11/19/2017 Document Reviewed: 10/28/2017 Elsevier Patient Education  2020 Elsevier Inc.  

## 2019-09-27 ENCOUNTER — Other Ambulatory Visit: Payer: Self-pay | Admitting: *Deleted

## 2019-09-27 DIAGNOSIS — E039 Hypothyroidism, unspecified: Secondary | ICD-10-CM

## 2019-09-27 LAB — CMP14+EGFR
ALT: 18 IU/L (ref 0–32)
AST: 20 IU/L (ref 0–40)
Albumin/Globulin Ratio: 1.4 (ref 1.2–2.2)
Albumin: 4 g/dL (ref 3.8–4.9)
Alkaline Phosphatase: 97 IU/L (ref 39–117)
BUN/Creatinine Ratio: 9 — ABNORMAL LOW (ref 12–28)
BUN: 7 mg/dL — ABNORMAL LOW (ref 8–27)
Bilirubin Total: 0.2 mg/dL (ref 0.0–1.2)
CO2: 23 mmol/L (ref 20–29)
Calcium: 9.8 mg/dL (ref 8.7–10.3)
Chloride: 103 mmol/L (ref 96–106)
Creatinine, Ser: 0.78 mg/dL (ref 0.57–1.00)
GFR calc Af Amer: 96 mL/min/{1.73_m2} (ref >59)
GFR calc non Af Amer: 83 mL/min/{1.73_m2} (ref >59)
Globulin, Total: 2.8 g/dL (ref 1.5–4.5)
Glucose: 93 mg/dL (ref 65–99)
Potassium: 4.1 mmol/L (ref 3.5–5.2)
Sodium: 141 mmol/L (ref 134–144)
Total Protein: 6.8 g/dL (ref 6.0–8.5)

## 2019-09-27 LAB — LIPID PANEL
Chol/HDL Ratio: 4 ratio (ref 0.0–4.4)
Cholesterol, Total: 207 mg/dL — ABNORMAL HIGH (ref 100–199)
HDL: 52 mg/dL (ref >39)
LDL Chol Calc (NIH): 135 mg/dL — ABNORMAL HIGH (ref 0–99)
Triglycerides: 112 mg/dL (ref 0–149)
VLDL Cholesterol Cal: 20 mg/dL (ref 5–40)

## 2019-09-27 LAB — THYROID PANEL WITH TSH
Free Thyroxine Index: 2.2 (ref 1.2–4.9)
T3 Uptake Ratio: 24 % (ref 24–39)
T4, Total: 9.2 ug/dL (ref 4.5–12.0)
TSH: 10.3 u[IU]/mL — ABNORMAL HIGH (ref 0.450–4.500)

## 2019-09-27 MED ORDER — LEVOTHYROXINE SODIUM 88 MCG PO TABS: 88.0000 ug | ORAL_TABLET | Freq: Every day | ORAL | 1 refills | Status: DC

## 2019-09-27 MED ORDER — ATORVASTATIN CALCIUM 40 MG PO TABS: 40.0000 mg | ORAL_TABLET | Freq: Every day | ORAL | 1 refills | Status: DC

## 2019-09-27 NOTE — Addendum Note (Signed)
Addended by: Chevis Pretty on: 09/27/2019 12:20 PM   Modules accepted: Orders

## 2019-09-27 NOTE — Addendum Note (Signed)
Addended by: Chevis Pretty on: 09/27/2019 12:18 PM   Modules accepted: Orders

## 2019-09-28 ENCOUNTER — Other Ambulatory Visit: Payer: Self-pay | Admitting: Nurse Practitioner

## 2019-09-28 MED ORDER — SIMVASTATIN 40 MG PO TABS: 40.0000 mg | ORAL_TABLET | Freq: Every evening | ORAL | 11 refills | Status: DC

## 2019-09-28 MED ORDER — SIMVASTATIN 40 MG PO TABS: 40.0000 mg | ORAL_TABLET | Freq: Every evening | ORAL | 1 refills | Status: DC

## 2019-12-26 ENCOUNTER — Other Ambulatory Visit: Payer: Self-pay | Admitting: Nurse Practitioner

## 2019-12-26 DIAGNOSIS — I10 Essential (primary) hypertension: Secondary | ICD-10-CM

## 2019-12-26 MED ORDER — LOSARTAN POTASSIUM 100 MG PO TABS: 100.0000 mg | ORAL_TABLET | Freq: Every day | ORAL | 0 refills | Status: DC

## 2019-12-26 NOTE — Telephone Encounter (Signed)
NA/busy 30 d supply sent to [facility] till mail order is received

## 2020-01-11 IMAGING — DX DG CHEST 2V
2 series · 2 of 2 positions shown · non-contrast
Comparison: 07/22/2015

CLINICAL DATA: Benign essential hypertension.

EXAM:
CHEST - 2 VIEW

[chest pa]
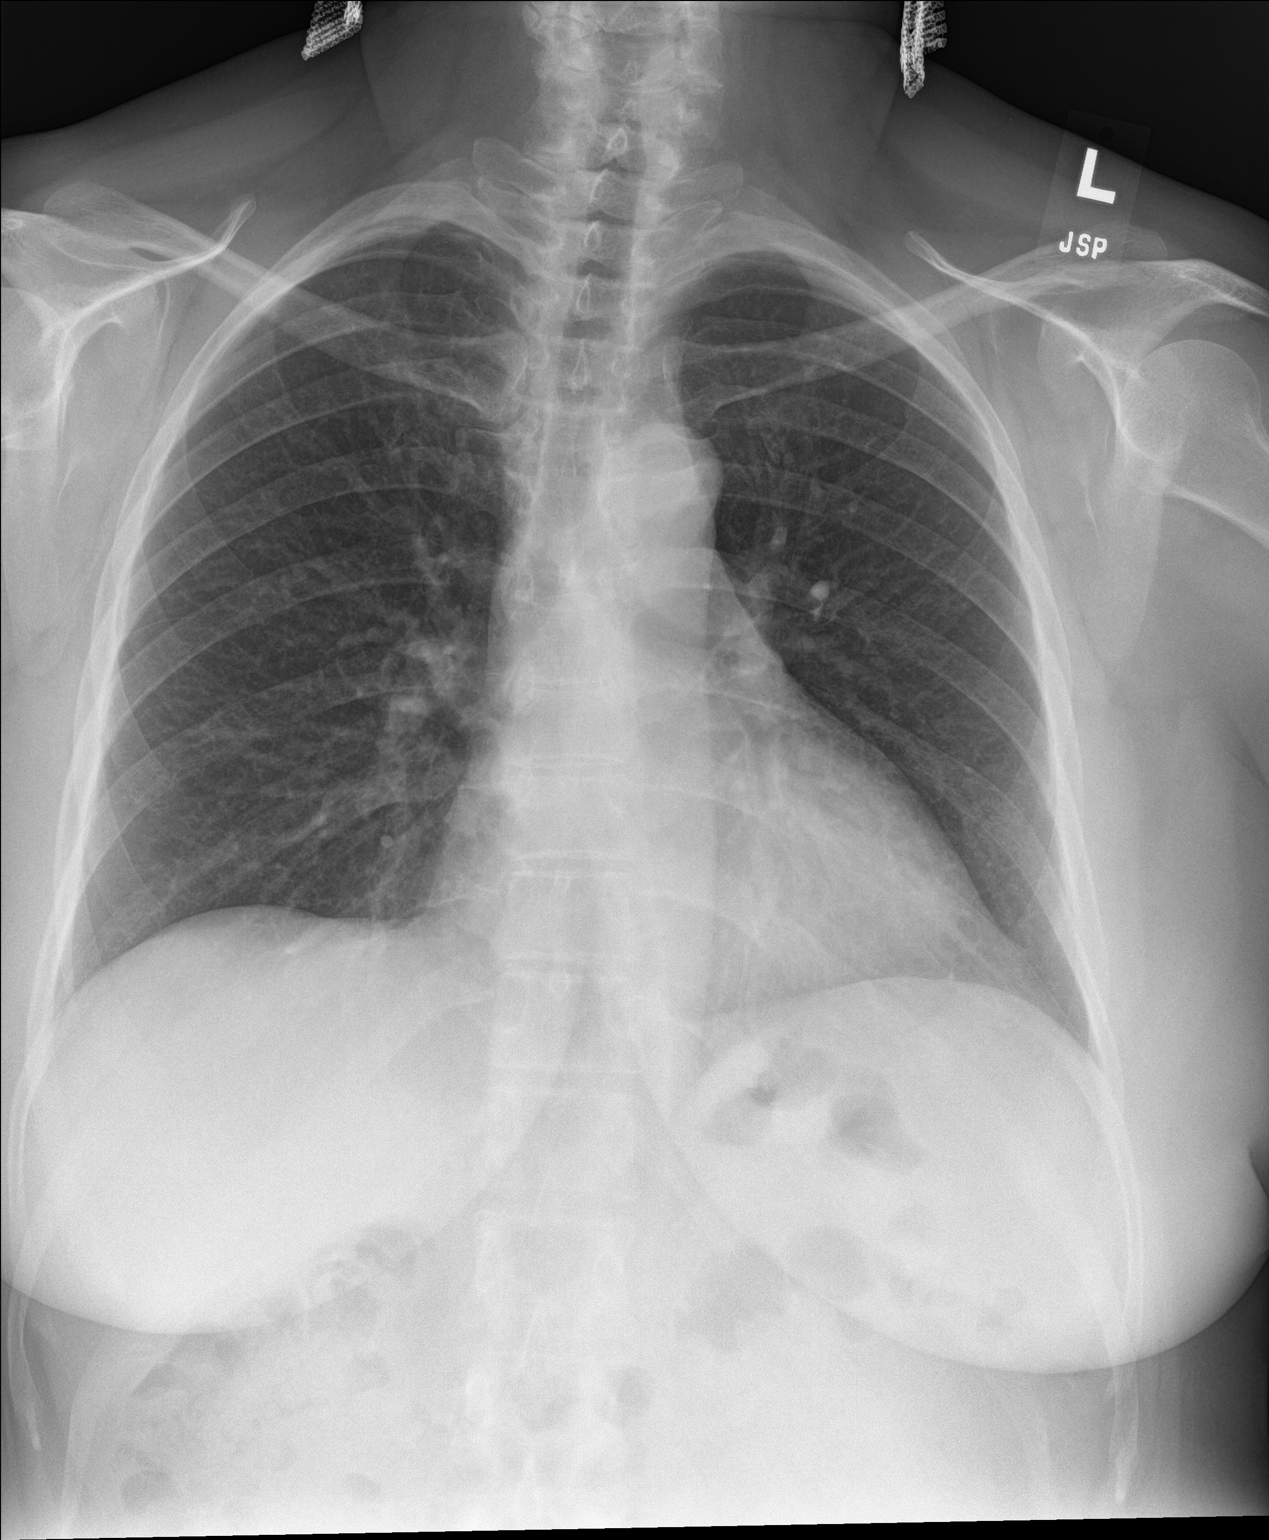

[chest lat]
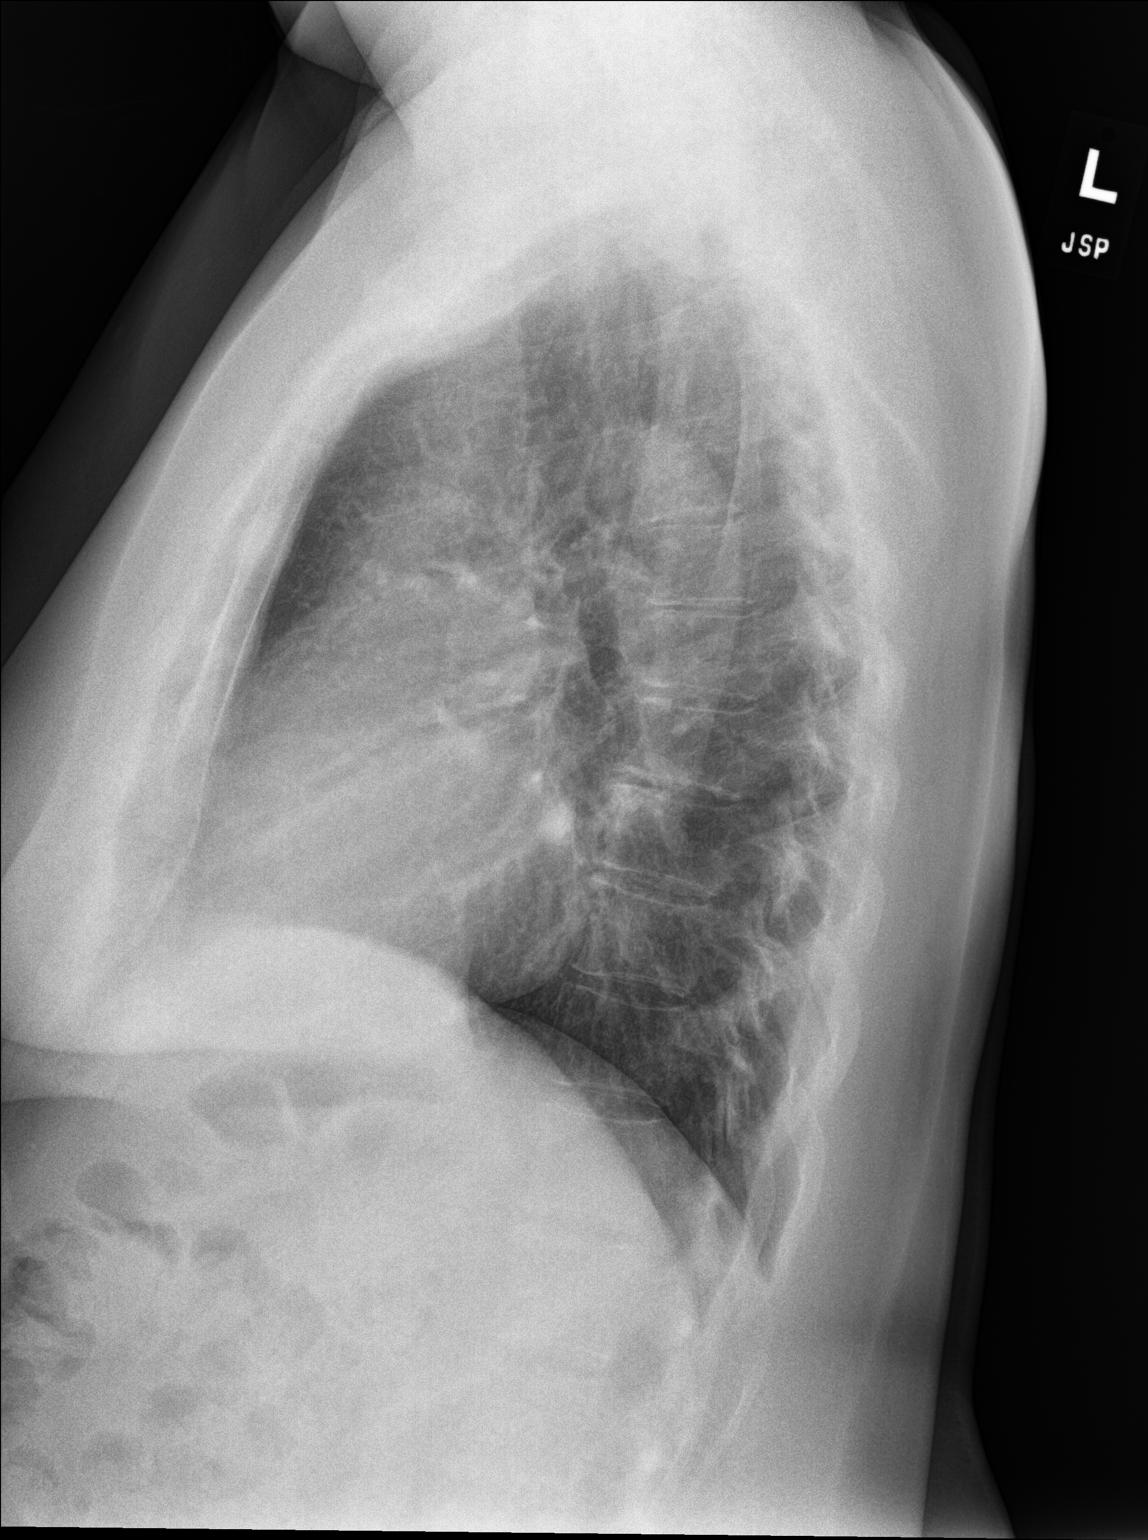

[2 of 2 positions shown; findings below may reference images not displayed]

FINDINGS: The cardiac silhouette is upper limits of normal in size. The lungs
are clear. No pleural effusion or pneumothorax is identified. No
acute osseous abnormality is seen.
IMPRESSION: No active cardiopulmonary disease.

## 2020-02-29 DIAGNOSIS — Z23 Encounter for immunization: Secondary | ICD-10-CM | POA: Diagnosis not present

## 2020-03-05 ENCOUNTER — Other Ambulatory Visit: Payer: Self-pay | Admitting: Nurse Practitioner

## 2020-03-05 DIAGNOSIS — I10 Essential (primary) hypertension: Secondary | ICD-10-CM

## 2020-03-26 ENCOUNTER — Ambulatory Visit: Payer: Self-pay | Admitting: Nurse Practitioner

## 2020-04-01 DIAGNOSIS — Z23 Encounter for immunization: Secondary | ICD-10-CM | POA: Diagnosis not present

## 2020-04-22 ENCOUNTER — Ambulatory Visit: Payer: BC Managed Care – PPO | Admitting: Nurse Practitioner

## 2020-04-22 ENCOUNTER — Encounter: Payer: Self-pay | Admitting: Nurse Practitioner

## 2020-04-22 ENCOUNTER — Other Ambulatory Visit: Payer: Self-pay

## 2020-04-22 VITALS — BP 173/86 | HR 73 | Temp 97.7°F | Resp 20 | Ht 62.0 in | Wt 173.0 lb

## 2020-04-22 DIAGNOSIS — E785 Hyperlipidemia, unspecified: Secondary | ICD-10-CM | POA: Diagnosis not present

## 2020-04-22 DIAGNOSIS — Z6830 Body mass index (BMI) 30.0-30.9, adult: Secondary | ICD-10-CM

## 2020-04-22 DIAGNOSIS — E039 Hypothyroidism, unspecified: Secondary | ICD-10-CM | POA: Diagnosis not present

## 2020-04-22 DIAGNOSIS — I10 Essential (primary) hypertension: Secondary | ICD-10-CM | POA: Diagnosis not present

## 2020-04-22 DIAGNOSIS — I6930 Unspecified sequelae of cerebral infarction: Secondary | ICD-10-CM | POA: Diagnosis not present

## 2020-04-22 MED ORDER — ATORVASTATIN CALCIUM 40 MG PO TABS: 40.0000 mg | ORAL_TABLET | Freq: Every day | ORAL | 1 refills | Status: DC

## 2020-04-22 MED ORDER — LEVOTHYROXINE SODIUM 88 MCG PO TABS: 88.0000 ug | ORAL_TABLET | Freq: Every day | ORAL | 1 refills | Status: DC

## 2020-04-22 MED ORDER — LOSARTAN POTASSIUM 100 MG PO TABS: 100.0000 mg | ORAL_TABLET | Freq: Every day | ORAL | 1 refills | Status: DC

## 2020-04-22 MED ORDER — SIMVASTATIN 40 MG PO TABS: 40.0000 mg | ORAL_TABLET | Freq: Every evening | ORAL | 1 refills | Status: DC

## 2020-04-22 MED ORDER — AMLODIPINE BESYLATE 2.5 MG PO TABS: 2.5000 mg | ORAL_TABLET | Freq: Every day | ORAL | 1 refills | Status: DC

## 2020-04-22 NOTE — Progress Notes (Signed)
Subjective:    Patient ID: Brooke Newton, female    DOB: August 14, 1961, 59 y.o.   MRN: [id number]   Chief Complaint: Medical Management of Chronic Issues    HPI:  1. Essential hypertension, benign Patient checks blood pressures at home and states that they run in the 140s. She denies dizziness, swelling, syncope, or palpitations. Blood pressure elevated in office today. 150/80 on recheck. BP Readings from Last 3 Encounters:  04/22/20 (!) 173/86  09/26/19 136/82  02/27/19 138/82    2. Late effect of cerebrovascular accident Patient denies residual effects from her stroke several years ago. He ay toke was caused by ow potassium.  3. Hyperlipidemia with target LDL less than 100 Patient admits to some compliance with a heart healthy diet. Would like information on a diet.  Lab Results  Component Value Date   CHOL 207 (H) 09/26/2019   HDL 52 09/26/2019   LDLCALC 135 (H) 09/26/2019   TRIG 112 09/26/2019   CHOLHDL 4.0 09/26/2019    4. BMI 30.0-30.9,adult BMI Readings from Last 3 Encounters:  04/22/20 31.64 kg/m  09/26/19 30.91 kg/m  02/27/19 30.73 kg/m   Wt Readings from Last 3 Encounters:  04/22/20 173 lb (78.5 kg)  09/26/19 169 lb (76.7 kg)  02/27/19 168 lb (76.2 kg)    5. Hypothyroidism, unspecified type Patient denies new symptoms. Increase levothyroxin to 39mg with lat viit. Patient did not come in and have labs repeated in 8 week like intructed. Lab Results  Component Value Date   TSH 10.300 (H) 09/26/2019    New complaints: Patient denies new complaints today  Social history: Patient admits to some recent stress surrounding her 966year old granddaughter being diagnosed with Type 1 diabetes.  Controlled substance contract: n/a   Review of Systems  Constitutional: Negative for diaphoresis.  Eyes: Negative for pain.  Respiratory: Negative for shortness of breath.   Cardiovascular: Negative for chest pain, palpitations and leg swelling.    Gastrointestinal: Negative for abdominal pain.  Endocrine: Negative for polydipsia.  Skin: Negative for rash.  Neurological: Negative for dizziness, weakness and headaches.  Hematological: Does not bruise/bleed easily.  All other systems reviewed and are negative.      Objective:   Physical Exam Constitutional:      Appearance: Normal appearance.  HENT:     Head: Normocephalic.  Eyes:     Conjunctiva/sclera: Conjunctivae normal.  Cardiovascular:     Rate and Rhythm: Normal rate and regular rhythm.     Pulses: Normal pulses.     Heart sounds: Normal heart sounds.  Pulmonary:     Breath sounds: Normal breath sounds.  Abdominal:     General: Bowel sounds are normal.  Skin:    General: Skin is warm and dry.  Neurological:     Mental Status: She is alert and oriented to person, place, and time.  Psychiatric:        Mood and Affect: Mood normal.        Behavior: Behavior normal.     BP (!) 173/86   Pulse 73   Temp 97.7 F (36.5 C) (Temporal)   Resp 20   Ht '5\' 2"'  (1.575 m)   Wt 173 lb (78.5 kg)   SpO2 96%   BMI 31.64 kg/m       Assessment & Plan:  PAnaleah Bramecomes in today with chief complaint of Medical Management of Chronic Issues   Diagnosis and orders addressed:  1. Essential hypertension, benign Low sodium diet Added  norvac 2.16m daily to med - losartan (COZAAR) 100 MG tablet; Take 1 tablet (100 mg total) by mouth daily.  Dispense: 90 tablet; Refill: 1 - amLODipine (NORVASC) 2.5 MG tablet; Take 1 tablet (2.5 mg total) by mouth daily.  Dispense: 90 tablet; Refill: 1 - CMP14+EGFR - CBC with Differential/Platelet Would like to keep her blood pressure slightly higher than normal due to her CVA history. Norvasc added today.  2. Late effect of cerebrovascular accident Patient encouraged to monitor her blood pressure at home, especially with new medication added today.   3. Hyperlipidemia with target LDL less than 100 Low fat diet - simvastatin (ZOCOR)  40 MG tablet; Take 1 tablet (40 mg total) by mouth every evening.  Dispense: 90 tablet; Refill: 1 - atorvastatin (LIPITOR) 40 MG tablet; Take 1 tablet (40 mg total) by mouth daily.  Dispense: 90 tablet; Refill: 1 - Lipid panel Patient given information on watching calorie intake.  4. BMI 30.0-30.9,adult Patient states that she will resume walking and is interested in monitoring her diet and calorie intake closely.  5. Hypothyroidism, unspecified type - levothyroxine (SYNTHROID) 88 MCG tablet; Take 1 tablet (88 mcg total) by mouth daily.  Dispense: 90 tablet; Refill: 1 - Thyroid Panel With TSH   Labs pending Health Maintenance reviewed Diet and exercise encouraged  Follow up plan: Follow up in 3 months or sooner if new complaints arise.  Mary-Margaret MHassell Done FNP ARobynn Pane RN, FNP student

## 2020-04-22 NOTE — Patient Instructions (Signed)

## 2020-04-23 LAB — CBC WITH DIFFERENTIAL/PLATELET
Basophils Absolute: 0 10*3/uL (ref 0.0–0.2)
Basos: 1 %
EOS (ABSOLUTE): 0.1 10*3/uL (ref 0.0–0.4)
Eos: 2 %
Hematocrit: 41.9 % (ref 34.0–46.6)
Hemoglobin: 13.9 g/dL (ref 11.1–15.9)
Immature Grans (Abs): 0 10*3/uL (ref 0.0–0.1)
Immature Granulocytes: 0 %
Lymphocytes Absolute: 1.8 10*3/uL (ref 0.7–3.1)
Lymphs: 25 %
MCH: 29.9 pg (ref 26.6–33.0)
MCHC: 33.2 g/dL (ref 31.5–35.7)
MCV: 90 fL (ref 79–97)
Monocytes Absolute: 0.4 10*3/uL (ref 0.1–0.9)
Monocytes: 5 %
Neutrophils Absolute: 4.8 10*3/uL (ref 1.4–7.0)
Neutrophils: 67 %
Platelets: 297 10*3/uL (ref 150–450)
RBC: 4.65 x10E6/uL (ref 3.77–5.28)
RDW: 13.9 % (ref 11.7–15.4)
WBC: 7.2 10*3/uL (ref 3.4–10.8)

## 2020-04-23 LAB — CMP14+EGFR
ALT: 27 IU/L (ref 0–32)
AST: 27 IU/L (ref 0–40)
Albumin/Globulin Ratio: 1.5 (ref 1.2–2.2)
Albumin: 4.1 g/dL (ref 3.8–4.9)
Alkaline Phosphatase: 103 IU/L (ref 39–117)
BUN/Creatinine Ratio: 10 — ABNORMAL LOW (ref 12–28)
BUN: 8 mg/dL (ref 8–27)
Bilirubin Total: 0.4 mg/dL (ref 0.0–1.2)
CO2: 21 mmol/L (ref 20–29)
Calcium: 9.3 mg/dL (ref 8.7–10.3)
Chloride: 104 mmol/L (ref 96–106)
Creatinine, Ser: 0.81 mg/dL (ref 0.57–1.00)
GFR calc Af Amer: 91 mL/min/{1.73_m2} (ref >59)
GFR calc non Af Amer: 79 mL/min/{1.73_m2} (ref >59)
Globulin, Total: 2.8 g/dL (ref 1.5–4.5)
Glucose: 94 mg/dL (ref 65–99)
Potassium: 4.4 mmol/L (ref 3.5–5.2)
Sodium: 138 mmol/L (ref 134–144)
Total Protein: 6.9 g/dL (ref 6.0–8.5)

## 2020-04-23 LAB — LIPID PANEL
Chol/HDL Ratio: 4.2 ratio (ref 0.0–4.4)
Cholesterol, Total: 205 mg/dL — ABNORMAL HIGH (ref 100–199)
HDL: 49 mg/dL (ref >39)
LDL Chol Calc (NIH): 143 mg/dL — ABNORMAL HIGH (ref 0–99)
Triglycerides: 71 mg/dL (ref 0–149)
VLDL Cholesterol Cal: 13 mg/dL (ref 5–40)

## 2020-04-23 LAB — THYROID PANEL WITH TSH
Free Thyroxine Index: 2.5 (ref 1.2–4.9)
T3 Uptake Ratio: 26 % (ref 24–39)
T4, Total: 9.7 ug/dL (ref 4.5–12.0)
TSH: 4.69 u[IU]/mL — ABNORMAL HIGH (ref 0.450–4.500)

## 2020-04-24 ENCOUNTER — Telehealth: Payer: Self-pay | Admitting: Nurse Practitioner

## 2020-04-24 NOTE — Telephone Encounter (Signed)
Pt returned missed call from [facility] regarding lab results. Went over lab results with patient per MMM notes. Pt voiced understanding.

## 2020-07-23 ENCOUNTER — Encounter: Payer: Self-pay | Admitting: Nurse Practitioner

## 2020-07-23 ENCOUNTER — Ambulatory Visit: Payer: BC Managed Care – PPO | Admitting: Nurse Practitioner

## 2020-07-23 ENCOUNTER — Other Ambulatory Visit: Payer: Self-pay

## 2020-07-23 VITALS — BP 169/86 | HR 74 | Temp 97.2°F | Resp 20 | Ht 62.0 in | Wt 168.0 lb

## 2020-07-23 DIAGNOSIS — I10 Essential (primary) hypertension: Secondary | ICD-10-CM | POA: Diagnosis not present

## 2020-07-23 DIAGNOSIS — E785 Hyperlipidemia, unspecified: Secondary | ICD-10-CM | POA: Diagnosis not present

## 2020-07-23 DIAGNOSIS — Z6830 Body mass index (BMI) 30.0-30.9, adult: Secondary | ICD-10-CM

## 2020-07-23 DIAGNOSIS — I6930 Unspecified sequelae of cerebral infarction: Secondary | ICD-10-CM | POA: Diagnosis not present

## 2020-07-23 DIAGNOSIS — E039 Hypothyroidism, unspecified: Secondary | ICD-10-CM | POA: Diagnosis not present

## 2020-07-23 MED ORDER — LEVOTHYROXINE SODIUM 88 MCG PO TABS: 88.0000 ug | ORAL_TABLET | Freq: Every day | ORAL | 1 refills | Status: DC

## 2020-07-23 MED ORDER — AMLODIPINE BESYLATE 5 MG PO TABS: 5.0000 mg | ORAL_TABLET | Freq: Every day | ORAL | 1 refills | Status: DC

## 2020-07-23 MED ORDER — LOSARTAN POTASSIUM 100 MG PO TABS: 100.0000 mg | ORAL_TABLET | Freq: Every day | ORAL | 1 refills | Status: DC

## 2020-07-23 NOTE — Progress Notes (Signed)
Subjective:    Patient ID: Brooke Newton, female    DOB: 1961/08/01, 59 y.o.   MRN: [id number]   Chief Complaint: Medical Management of Chronic Issues    HPI:  1. Essential hypertension, benign No c/o chest pain, sob or headache. Does  check blood pressure at home. Runs around 150systolic. BP Readings from Last 3 Encounters:  07/23/20 (!) 169/86  04/22/20 (!) 173/86  09/26/19 136/82      2. Hyperlipidemia with target LDL less than 100 Does try to watch diet but does very little exercise. Has not been taking her zocor. Lab Results  Component Value Date   CHOL 205 (H) 04/22/2020   HDL 49 04/22/2020   LDLCALC 143 (H) 04/22/2020   TRIG 71 04/22/2020   CHOLHDL 4.2 04/22/2020   The 10-year ASCVD risk score Mikey Bussing DC Jr., et al., 2013) is: 9.2%   Values used to calculate the score:     Age: 4 years     Sex: Female     Is Non-Hispanic African American: No     Diabetic: No     Tobacco smoker: No     Systolic Blood Pressure: 481 mmHg     Is BP treated: Yes     HDL Cholesterol: 49 mg/dL     Total Cholesterol: 205 mg/dL     3. Acquired hypothyroidism No problems that aware of Lab Results  Component Value Date   TSH 4.690 (H) 04/22/2020     4. Late effect of cerebrovascular accident No permanent effects from CVA. denies any weakness  5. BMI 30.0-30.9,adult No recent weight changes Wt Readings from Last 3 Encounters:  07/23/20 168 lb (76.2 kg)  04/22/20 173 lb (78.5 kg)  09/26/19 169 lb (76.7 kg)    BMI Readings from Last 3 Encounters:  07/23/20 30.73 kg/m  04/22/20 31.64 kg/m  09/26/19 30.91 kg/m       Outpatient Encounter Medications as of 07/23/2020  Medication Sig  . amLODipine (NORVASC) 2.5 MG tablet Take 1 tablet (2.5 mg total) by mouth daily.  Marland Kitchen aspirin 325 MG tablet Take 325 mg by mouth daily.  Marland Kitchen levothyroxine (SYNTHROID) 88 MCG tablet Take 1 tablet (88 mcg total) by mouth daily.  Marland Kitchen losartan (COZAAR) 100 MG tablet Take 1 tablet (100 mg  total) by mouth daily.  Marland Kitchen atorvastatin (LIPITOR) 40 MG tablet Take 1 tablet (40 mg total) by mouth daily. (Patient not taking: Reported on 07/23/2020)  . simvastatin (ZOCOR) 40 MG tablet Take 1 tablet (40 mg total) by mouth every evening. (Patient not taking: Reported on 07/23/2020)     Past Surgical History:  Procedure Laterality Date  . CESAREAN SECTION      Family History  Problem Relation Age of Onset  . Hypertension Mother   . Stroke Mother   . Hypertension Brother     New complaints: None today  Social history: Lives with her husband. Granddaughter has type 1 diabetes and is doing well.  Controlled substance contract: n/a    Review of Systems  Constitutional: Negative for diaphoresis.  Eyes: Negative for pain.  Respiratory: Negative for shortness of breath.   Cardiovascular: Negative for chest pain, palpitations and leg swelling.  Gastrointestinal: Negative for abdominal pain.  Endocrine: Negative for polydipsia.  Skin: Negative for rash.  Neurological: Negative for dizziness, weakness and headaches.  Hematological: Does not bruise/bleed easily.  All other systems reviewed and are negative.      Objective:   Physical Exam Vitals and nursing note reviewed.  Constitutional:      General: She is not in acute distress.    Appearance: Normal appearance. She is well-developed.  HENT:     Head: Normocephalic.     Nose: Nose normal.  Eyes:     Pupils: Pupils are equal, round, and reactive to light.  Neck:     Vascular: No carotid bruit or JVD.  Cardiovascular:     Rate and Rhythm: Normal rate and regular rhythm.     Heart sounds: Normal heart sounds.     Comments: multiple varicose veins bil lower ext Pulmonary:     Effort: Pulmonary effort is normal. No respiratory distress.     Breath sounds: Normal breath sounds. No wheezing or rales.  Chest:     Chest wall: No tenderness.  Abdominal:     General: Bowel sounds are normal. There is no distension or  abdominal bruit.     Palpations: Abdomen is soft. There is no hepatomegaly, splenomegaly, mass or pulsatile mass.     Tenderness: There is no abdominal tenderness.  Musculoskeletal:        General: Normal range of motion.     Cervical back: Normal range of motion and neck supple.  Lymphadenopathy:     Cervical: No cervical adenopathy.  Skin:    General: Skin is warm and dry.  Neurological:     Mental Status: She is alert and oriented to person, place, and time.     Deep Tendon Reflexes: Reflexes are normal and symmetric.  Psychiatric:        Behavior: Behavior normal.        Thought Content: Thought content normal.        Judgment: Judgment normal.    BP (!) 169/86   Pulse 74   Temp (!) 97.2 F (36.2 C) (Temporal)   Resp 20   Ht '5\' 2"'  (1.575 m)   Wt 168 lb (76.2 kg)   SpO2 97%   BMI 30.73 kg/m          Assessment & Plan:  Brooke Newton comes in today with chief complaint of Medical Management of Chronic Issues   Diagnosis and orders addressed:  1. Essential hypertension, benign Increased amlodipine from 2.74m to 550mdaily Watch for lower ext swelling - amLODipine (NORVASC) 5 MG tablet; Take 1 tablet (5 mg total) by mouth daily.  Dispense: 90 tablet; Refill: 1 - losartan (COZAAR) 100 MG tablet; Take 1 tablet (100 mg total) by mouth daily.  Dispense: 90 tablet; Refill: 1 - CBC with Differential/Platelet - CMP14+EGFR  2. Hyperlipidemia with target LDL less than 100 Low fat diet - Lipid panel  3. Acquired hypothyroidism Labs pending - levothyroxine (SYNTHROID) 88 MCG tablet; Take 1 tablet (88 mcg total) by mouth daily.  Dispense: 90 tablet; Refill: 1 - Thyroid Panel With TSH  4. Late effect of cerebrovascular accident Stroke prevention discussed  5. BMI 30.0-30.9,adult Discussed diet and exercise for person with BMI >25 Will recheck weight in 3-6 months   Labs pending Health Maintenance reviewed Diet and exercise encouraged  Follow up plan: 6  months   Mary-Margaret MaHassell DoneFNP

## 2020-07-23 NOTE — Patient Instructions (Signed)

## 2020-07-24 LAB — LIPID PANEL
Chol/HDL Ratio: 4.2 ratio (ref 0.0–4.4)
Cholesterol, Total: 215 mg/dL — ABNORMAL HIGH (ref 100–199)
HDL: 51 mg/dL (ref >39)
LDL Chol Calc (NIH): 145 mg/dL — ABNORMAL HIGH (ref 0–99)
Triglycerides: 107 mg/dL (ref 0–149)
VLDL Cholesterol Cal: 19 mg/dL (ref 5–40)

## 2020-07-24 LAB — CMP14+EGFR
ALT: 24 IU/L (ref 0–32)
AST: 24 IU/L (ref 0–40)
Albumin/Globulin Ratio: 1.5 (ref 1.2–2.2)
Albumin: 4.5 g/dL (ref 3.8–4.8)
Alkaline Phosphatase: 110 IU/L (ref 48–121)
BUN/Creatinine Ratio: 8 — ABNORMAL LOW (ref 12–28)
BUN: 7 mg/dL — ABNORMAL LOW (ref 8–27)
Bilirubin Total: 0.4 mg/dL (ref 0.0–1.2)
CO2: 23 mmol/L (ref 20–29)
Calcium: 10.1 mg/dL (ref 8.7–10.3)
Chloride: 102 mmol/L (ref 96–106)
Creatinine, Ser: 0.83 mg/dL (ref 0.57–1.00)
GFR calc Af Amer: 88 mL/min/{1.73_m2} (ref >59)
GFR calc non Af Amer: 76 mL/min/{1.73_m2} (ref >59)
Globulin, Total: 3 g/dL (ref 1.5–4.5)
Glucose: 98 mg/dL (ref 65–99)
Potassium: 4.6 mmol/L (ref 3.5–5.2)
Sodium: 140 mmol/L (ref 134–144)
Total Protein: 7.5 g/dL (ref 6.0–8.5)

## 2020-07-24 LAB — CBC WITH DIFFERENTIAL/PLATELET
Basophils Absolute: 0 10*3/uL (ref 0.0–0.2)
Basos: 0 %
EOS (ABSOLUTE): 0.1 10*3/uL (ref 0.0–0.4)
Eos: 1 %
Hematocrit: 45.1 % (ref 34.0–46.6)
Hemoglobin: 14.6 g/dL (ref 11.1–15.9)
Immature Grans (Abs): 0 10*3/uL (ref 0.0–0.1)
Immature Granulocytes: 0 %
Lymphocytes Absolute: 1.5 10*3/uL (ref 0.7–3.1)
Lymphs: 17 %
MCH: 29.5 pg (ref 26.6–33.0)
MCHC: 32.4 g/dL (ref 31.5–35.7)
MCV: 91 fL (ref 79–97)
Monocytes Absolute: 0.4 10*3/uL (ref 0.1–0.9)
Monocytes: 4 %
Neutrophils Absolute: 6.9 10*3/uL (ref 1.4–7.0)
Neutrophils: 78 %
Platelets: 352 10*3/uL (ref 150–450)
RBC: 4.95 x10E6/uL (ref 3.77–5.28)
RDW: 13.9 % (ref 11.7–15.4)
WBC: 9 10*3/uL (ref 3.4–10.8)

## 2020-07-24 LAB — THYROID PANEL WITH TSH
Free Thyroxine Index: 3.2 (ref 1.2–4.9)
T3 Uptake Ratio: 27 % (ref 24–39)
T4, Total: 11.9 ug/dL (ref 4.5–12.0)
TSH: 2.39 u[IU]/mL (ref 0.450–4.500)

## 2020-11-28 DIAGNOSIS — Z23 Encounter for immunization: Secondary | ICD-10-CM | POA: Diagnosis not present

## 2020-12-17 ENCOUNTER — Encounter: Payer: Self-pay | Admitting: *Deleted

## 2021-01-24 ENCOUNTER — Ambulatory Visit: Payer: Self-pay | Admitting: Nurse Practitioner

## 2021-01-27 ENCOUNTER — Ambulatory Visit: Payer: BC Managed Care – PPO | Admitting: Nurse Practitioner

## 2021-01-27 ENCOUNTER — Ambulatory Visit: Payer: BC Managed Care – PPO

## 2021-01-27 NOTE — Progress Notes (Deleted)
  Chief Complaint: No chief complaint on file.    HPI:  1. Essential hypertension, benign ***  2. Hyperlipidemia with target LDL less than 100 ***  3. BMI 30.0-30.9,adult ***    Outpatient Encounter Medications as of 01/27/2021  Medication Sig  . amLODipine (NORVASC) 5 MG tablet Take 1 tablet (5 mg total) by mouth daily.  Marland Kitchen aspirin 325 MG tablet Take 325 mg by mouth daily.  Marland Kitchen atorvastatin (LIPITOR) 40 MG tablet Take 1 tablet (40 mg total) by mouth daily. (Patient not taking: Reported on 07/23/2020)  . levothyroxine (SYNTHROID) 88 MCG tablet Take 1 tablet (88 mcg total) by mouth daily.  Marland Kitchen losartan (COZAAR) 100 MG tablet Take 1 tablet (100 mg total) by mouth daily.   No facility-administered encounter medications on file as of 01/27/2021.    Past Surgical History:  Procedure Laterality Date  . CESAREAN SECTION      Family History  Problem Relation Age of Onset  . Hypertension Mother   . Stroke Mother   . Hypertension Brother     New complaints: ***  Social history: ***  Controlled substance contract: ***

## 2021-02-04 ENCOUNTER — Other Ambulatory Visit: Payer: Self-pay

## 2021-02-04 ENCOUNTER — Encounter: Payer: Self-pay | Admitting: Nurse Practitioner

## 2021-02-04 ENCOUNTER — Ambulatory Visit: Payer: BC Managed Care – PPO | Admitting: Nurse Practitioner

## 2021-02-04 VITALS — BP 166/101 | HR 86 | Temp 97.8°F | Resp 20 | Ht 62.0 in | Wt 171.0 lb

## 2021-02-04 DIAGNOSIS — E785 Hyperlipidemia, unspecified: Secondary | ICD-10-CM

## 2021-02-04 DIAGNOSIS — I10 Essential (primary) hypertension: Secondary | ICD-10-CM | POA: Diagnosis not present

## 2021-02-04 DIAGNOSIS — I6930 Unspecified sequelae of cerebral infarction: Secondary | ICD-10-CM

## 2021-02-04 DIAGNOSIS — E039 Hypothyroidism, unspecified: Secondary | ICD-10-CM

## 2021-02-04 DIAGNOSIS — Z23 Encounter for immunization: Secondary | ICD-10-CM | POA: Diagnosis not present

## 2021-02-04 DIAGNOSIS — Z6830 Body mass index (BMI) 30.0-30.9, adult: Secondary | ICD-10-CM

## 2021-02-04 LAB — CBC WITH DIFFERENTIAL/PLATELET
Basophils Absolute: 0 10*3/uL (ref 0.0–0.2)
Basos: 1 %
EOS (ABSOLUTE): 0.2 10*3/uL (ref 0.0–0.4)
Eos: 2 %
Hematocrit: 45 % (ref 34.0–46.6)
Hemoglobin: 14.9 g/dL (ref 11.1–15.9)
Immature Grans (Abs): 0 10*3/uL (ref 0.0–0.1)
Immature Granulocytes: 0 %
Lymphocytes Absolute: 1.7 10*3/uL (ref 0.7–3.1)
Lymphs: 20 %
MCH: 29.9 pg (ref 26.6–33.0)
MCHC: 33.1 g/dL (ref 31.5–35.7)
MCV: 90 fL (ref 79–97)
Monocytes Absolute: 0.5 10*3/uL (ref 0.1–0.9)
Monocytes: 5 %
Neutrophils Absolute: 6.2 10*3/uL (ref 1.4–7.0)
Neutrophils: 72 %
Platelets: 323 10*3/uL (ref 150–450)
RBC: 4.98 x10E6/uL (ref 3.77–5.28)
RDW: 14.2 % (ref 11.7–15.4)
WBC: 8.6 10*3/uL (ref 3.4–10.8)

## 2021-02-04 LAB — CMP14+EGFR
ALT: 19 IU/L (ref 0–32)
AST: 22 IU/L (ref 0–40)
Albumin/Globulin Ratio: 1.6 (ref 1.2–2.2)
Albumin: 4.4 g/dL (ref 3.8–4.8)
Alkaline Phosphatase: 110 IU/L (ref 44–121)
BUN/Creatinine Ratio: 7 — ABNORMAL LOW (ref 12–28)
BUN: 6 mg/dL — ABNORMAL LOW (ref 8–27)
Bilirubin Total: 0.3 mg/dL (ref 0.0–1.2)
CO2: 22 mmol/L (ref 20–29)
Calcium: 9.7 mg/dL (ref 8.7–10.3)
Chloride: 104 mmol/L (ref 96–106)
Creatinine, Ser: 0.83 mg/dL (ref 0.57–1.00)
GFR calc Af Amer: 88 mL/min/{1.73_m2} (ref >59)
GFR calc non Af Amer: 76 mL/min/{1.73_m2} (ref >59)
Globulin, Total: 2.7 g/dL (ref 1.5–4.5)
Glucose: 100 mg/dL — ABNORMAL HIGH (ref 65–99)
Potassium: 4.7 mmol/L (ref 3.5–5.2)
Sodium: 142 mmol/L (ref 134–144)
Total Protein: 7.1 g/dL (ref 6.0–8.5)

## 2021-02-04 LAB — LIPID PANEL
Chol/HDL Ratio: 3.9 ratio (ref 0.0–4.4)
Cholesterol, Total: 201 mg/dL — ABNORMAL HIGH (ref 100–199)
HDL: 52 mg/dL (ref >39)
LDL Chol Calc (NIH): 132 mg/dL — ABNORMAL HIGH (ref 0–99)
Triglycerides: 92 mg/dL (ref 0–149)
VLDL Cholesterol Cal: 17 mg/dL (ref 5–40)

## 2021-02-04 MED ORDER — AMLODIPINE BESYLATE 10 MG PO TABS: 10.0000 mg | ORAL_TABLET | Freq: Every day | ORAL | 1 refills | Status: DC

## 2021-02-04 MED ORDER — LEVOTHYROXINE SODIUM 88 MCG PO TABS: 88.0000 ug | ORAL_TABLET | Freq: Every day | ORAL | 1 refills | Status: DC

## 2021-02-04 MED ORDER — LOSARTAN POTASSIUM 100 MG PO TABS
100.0000 mg | ORAL_TABLET | Freq: Every day | ORAL | 1 refills | Status: DC
Start: 2021-02-04 — End: 2021-08-26

## 2021-02-04 MED ORDER — ROSUVASTATIN CALCIUM 10 MG PO TABS: 10.0000 mg | ORAL_TABLET | Freq: Every day | ORAL | 1 refills | Status: DC

## 2021-02-04 NOTE — Progress Notes (Signed)
Subjective:    Patient ID: Brooke Newton, female    DOB: July 02, 1961, 60 y.o.   MRN: [id number]   Chief Complaint: Medical Management of Chronic Issues    HPI:  1. Essential hypertension, benign No c/o chest pain, sob or headache. Does  check blood pressure at home. And runs around 145/78. BP Readings from Last 3 Encounters:  02/04/21 (!) 166/101  07/23/20 (!) 169/86  04/22/20 (!) 173/86    2. Hyperlipidemia with target LDL less than 100 does try to watch diet but has not been doing any exercise. She stopped her lipitor, cause she said makes her feel bad. Lab Results  Component Value Date   CHOL 215 (H) 07/23/2020   HDL 51 07/23/2020   LDLCALC 145 (H) 07/23/2020   TRIG 107 07/23/2020   CHOLHDL 4.2 07/23/2020   The 10-year ASCVD risk score Mikey Bussing DC Jr., et al., 2013) is: 9%   3. Acquired hypothyroidism No problems that she is aware of. Lab Results  Component Value Date   TSH 2.390 07/23/2020     4. Late effect of cerebrovascular accident No residual effects from CVA  5. BMI 30.0-30.9,adult No recent weight changes. Wt Readings from Last 3 Encounters:  02/04/21 171 lb (77.6 kg)  07/23/20 168 lb (76.2 kg)  04/22/20 173 lb (78.5 kg)   BMI Readings from Last 3 Encounters:  02/04/21 31.28 kg/m  07/23/20 30.73 kg/m  04/22/20 31.64 kg/m       Outpatient Encounter Medications as of 02/04/2021  Medication Sig  . amLODipine (NORVASC) 5 MG tablet Take 1 tablet (5 mg total) by mouth daily.  Marland Kitchen aspirin 325 MG tablet Take 325 mg by mouth daily.  Marland Kitchen levothyroxine (SYNTHROID) 88 MCG tablet Take 1 tablet (88 mcg total) by mouth daily.  Marland Kitchen losartan (COZAAR) 100 MG tablet Take 1 tablet (100 mg total) by mouth daily.     Past Surgical History:  Procedure Laterality Date  . CESAREAN SECTION      Family History  Problem Relation Age of Onset  . Hypertension Mother   . Stroke Mother   . Hypertension Brother     New complaints: None today  Social  history: Lives with her husband  Controlled substance contract: n/a    Review of Systems  Constitutional: Negative for diaphoresis.  Eyes: Negative for pain.  Respiratory: Negative for shortness of breath.   Cardiovascular: Negative for chest pain, palpitations and leg swelling.  Gastrointestinal: Negative for abdominal pain.  Endocrine: Negative for polydipsia.  Skin: Negative for rash.  Neurological: Negative for dizziness, weakness and headaches.  Hematological: Does not bruise/bleed easily.  All other systems reviewed and are negative.      Objective:   Physical Exam Vitals and nursing note reviewed.  Constitutional:      General: She is not in acute distress.    Appearance: Normal appearance. She is well-developed and well-nourished.  HENT:     Head: Normocephalic.     Nose: Nose normal.     Mouth/Throat:     Mouth: Oropharynx is clear and moist.  Eyes:     Extraocular Movements: EOM normal.     Pupils: Pupils are equal, round, and reactive to light.  Neck:     Vascular: No carotid bruit or JVD.  Cardiovascular:     Rate and Rhythm: Normal rate and regular rhythm.     Pulses: Intact distal pulses.     Heart sounds: Normal heart sounds.  Pulmonary:     Effort:  Pulmonary effort is normal. No respiratory distress.     Breath sounds: Normal breath sounds. No wheezing or rales.  Chest:     Chest wall: No tenderness.  Abdominal:     General: Bowel sounds are normal. There is no distension or abdominal bruit. Aorta is normal.     Palpations: Abdomen is soft. There is no hepatomegaly, splenomegaly, mass or pulsatile mass.     Tenderness: There is no abdominal tenderness.  Musculoskeletal:        General: No edema. Normal range of motion.     Cervical back: Normal range of motion and neck supple.  Lymphadenopathy:     Cervical: No cervical adenopathy.  Skin:    General: Skin is warm and dry.  Neurological:     Mental Status: She is alert and oriented to person,  place, and time.     Deep Tendon Reflexes: Reflexes are normal and symmetric.  Psychiatric:        Mood and Affect: Mood and affect normal.        Behavior: Behavior normal.        Thought Content: Thought content normal.        Judgment: Judgment normal.     /\BP (!) 166/101   Pulse 86   Temp 97.8 F (36.6 C) (Temporal)   Resp 20   Ht _0  (1.575 m)   Wt 171 lb (77.6 kg)   SpO2 99%   BMI 31.28 kg/m         Assessment & Plan:  Brooke Newton comes in today with chief complaint of Medical Management of Chronic Issues   Diagnosis and orders addressed:  1. Essential hypertension, benign Low sodium diet Increased amlodipine to 13m daily - amLODipine (NORVASC) 10 MG tablet; Take 1 tablet (10 mg total) by mouth daily.  Dispense: 90 tablet; Refill: 1 - losartan (COZAAR) 100 MG tablet; Take 1 tablet (100 mg total) by mouth daily.  Dispense: 90 tablet; Refill: 1 - CBC with Differential/Platelet - CMP14+EGFR  2. Hyperlipidemia with target LDL less than 100 Low fat diet - rosuvastatin (CRESTOR) 10 MG tablet; Take 1 tablet (10 mg total) by mouth daily.  Dispense: 90 tablet; Refill: 1 - Lipid panel  3. Acquired hypothyroidism Labs pending - levothyroxine (SYNTHROID) 88 MCG tablet; Take 1 tablet (88 mcg total) by mouth daily.  Dispense: 90 tablet; Refill: 1  4. Late effect of cerebrovascular accident  5. BMI 30.0-30.9,adult Discussed diet and exercise for person with BMI >25 Will recheck weight in 3-6 months   Labs pending Health Maintenance reviewed Diet and exercise encouraged  Follow up plan: 6 months   Brooke Newton Done FNP

## 2021-02-04 NOTE — Patient Instructions (Signed)

## 2021-04-07 ENCOUNTER — Encounter: Payer: Self-pay | Admitting: Family

## 2021-04-07 ENCOUNTER — Ambulatory Visit: Payer: BC Managed Care – PPO | Admitting: Family

## 2021-04-07 ENCOUNTER — Other Ambulatory Visit: Payer: Self-pay

## 2021-04-07 VITALS — BP 129/80 | HR 80 | Temp 96.5°F | Ht 62.0 in | Wt 168.0 lb

## 2021-04-07 DIAGNOSIS — N3000 Acute cystitis without hematuria: Secondary | ICD-10-CM | POA: Diagnosis not present

## 2021-04-07 DIAGNOSIS — R300 Dysuria: Secondary | ICD-10-CM

## 2021-04-07 LAB — MICROSCOPIC EXAMINATION: RBC, Urine: NONE SEEN /hpf (ref 0–2)

## 2021-04-07 LAB — URINALYSIS, COMPLETE
Bilirubin, UA: NEGATIVE
Glucose, UA: NEGATIVE
Ketones, UA: NEGATIVE
Nitrite, UA: NEGATIVE
Specific Gravity, UA: 1.01 (ref 1.005–1.030)
Urobilinogen, Ur: 2 mg/dL — ABNORMAL HIGH (ref 0.2–1.0)
pH, UA: 6 (ref 5.0–7.5)

## 2021-04-07 MED ORDER — CEPHALEXIN 500 MG PO CAPS: 500.0000 mg | ORAL_CAPSULE | Freq: Two times a day (BID) | ORAL | 0 refills | Status: DC

## 2021-04-07 NOTE — Progress Notes (Signed)
Subjective:    Patient ID: Brooke Newton, female    DOB: [birth year], 60 y.o.   MRN: [id number]  Chief Complaint  Patient presents with  . Hematuria    Started last Wednesday got better then come back   . Dysuria  . Urinary Frequency    Hematuria Irritative symptoms include frequency and urgency. Associated symptoms include dysuria, flank pain and hesitancy. Pertinent negatives include no nausea or vomiting.  Dysuria  This is a new problem. The current episode started in the past 7 days. The problem occurs intermittently. The problem has been waxing and waning. The quality of the pain is described as burning. The pain is mild. The maximum temperature recorded prior to her arrival was 100 - 100.9 F. Associated symptoms include flank pain, frequency, hematuria, hesitancy and urgency. Pertinent negatives include no discharge, nausea or vomiting. She has tried increased fluids for the symptoms. The treatment provided mild relief.  Urinary Frequency  Associated symptoms include flank pain, frequency, hematuria, hesitancy and urgency. Pertinent negatives include no discharge, nausea or vomiting.      Review of Systems  Gastrointestinal: Negative for nausea and vomiting.  Genitourinary: Positive for dysuria, flank pain, frequency, hematuria, hesitancy and urgency.  All other systems reviewed and are negative.      Objective:   Physical Exam Vitals reviewed.  Constitutional:      General: She is not in acute distress.    Appearance: She is well-developed.  HENT:     Head: Normocephalic and atraumatic.     Right Ear: Tympanic membrane normal.     Left Ear: Tympanic membrane normal.  Eyes:     Pupils: Pupils are equal, round, and reactive to light.  Neck:     Thyroid: No thyromegaly.  Cardiovascular:     Rate and Rhythm: Normal rate and regular rhythm.     Heart sounds: Normal heart sounds. No murmur heard.   Pulmonary:     Effort: Pulmonary effort is normal. No respiratory  distress.     Breath sounds: Normal breath sounds. No wheezing.  Abdominal:     General: Bowel sounds are normal. There is no distension.     Palpations: Abdomen is soft.     Tenderness: There is no abdominal tenderness.  Musculoskeletal:        General: No tenderness. Normal range of motion.     Cervical back: Normal range of motion and neck supple.  Skin:    General: Skin is warm and dry.  Neurological:     Mental Status: She is alert and oriented to person, place, and time.     Cranial Nerves: No cranial nerve deficit.     Deep Tendon Reflexes: Reflexes are normal and symmetric.  Psychiatric:        Behavior: Behavior normal.        Thought Content: Thought content normal.        Judgment: Judgment normal.          BP 129/80   Pulse 80   Temp (!) 96.5 F (35.8 C) (Temporal)   Ht 5\' 2"  (1.575 m)   Wt 168 lb (76.2 kg)   BMI 30.73 kg/m   Assessment & Plan:  [name] comes in today with chief complaint of Hematuria (Started last Wednesday got better then come back ), Dysuria, and Urinary Frequency   Diagnosis and orders addressed:  1. Dysuria - Urinalysis, Complete - Urine Culture  2. Acute cystitis without hematuria Force fluids AZO over the  counter X2 days RTO prn Culture pending - cephALEXin (KEFLEX) 500 MG capsule; Take 1 capsule (500 mg total) by mouth 2 (two) times daily.  Dispense: 14 capsule; Refill: 0     Jannifer Rodney, FNP

## 2021-04-07 NOTE — Patient Instructions (Signed)
Urinary Tract Infection, Adult  A urinary tract infection (UTI) is an infection of any part of the urinary tract. The urinary tract includes the kidneys, ureters, bladder, and urethra. These organs make, store, and get rid of urine in the body. An upper UTI affects the ureters and kidneys. A lower UTI affects the bladder and urethra. What are the causes? Most urinary tract infections are caused by bacteria in your genital area around your urethra, where urine leaves your body. These bacteria grow and cause inflammation of your urinary tract. What increases the risk? You are more likely to develop this condition if:  You have a urinary catheter that stays in place.  You are not able to control when you urinate or have a bowel movement (incontinence).  You are female and you: ? Use a spermicide or diaphragm for birth control. ? Have low estrogen levels. ? Are pregnant.  You have certain genes that increase your risk.  You are sexually active.  You take antibiotic medicines.  You have a condition that causes your flow of urine to slow down, such as: ? An enlarged prostate, if you are female. ? Blockage in your urethra. ? A kidney stone. ? A nerve condition that affects your bladder control (neurogenic bladder). ? Not getting enough to drink, or not urinating often.  You have certain medical conditions, such as: ? Diabetes. ? A weak disease-fighting system (immunesystem). ? Sickle cell disease. ? Gout. ? Spinal cord injury. What are the signs or symptoms? Symptoms of this condition include:  Needing to urinate right away (urgency).  Frequent urination. This may include small amounts of urine each time you urinate.  Pain or burning with urination.  Blood in the urine.  Urine that smells bad or unusual.  Trouble urinating.  Cloudy urine.  Vaginal discharge, if you are female.  Pain in the abdomen or the lower back. You may also have:  Vomiting or a decreased  appetite.  Confusion.  Irritability or tiredness.  A fever or chills.  Diarrhea. The first symptom in older adults may be confusion. In some cases, they may not have any symptoms until the infection has worsened. How is this diagnosed? This condition is diagnosed based on your medical history and a physical exam. You may also have other tests, including:  Urine tests.  Blood tests.  Tests for STIs (sexually transmitted infections). If you have had more than one UTI, a cystoscopy or imaging studies may be done to determine the cause of the infections. How is this treated? Treatment for this condition includes:  Antibiotic medicine.  Over-the-counter medicines to treat discomfort.  Drinking enough water to stay hydrated. If you have frequent infections or have other conditions such as a kidney stone, you may need to see a health care provider who specializes in the urinary tract (urologist). In rare cases, urinary tract infections can cause sepsis. Sepsis is a life-threatening condition that occurs when the body responds to an infection. Sepsis is treated in the hospital with IV antibiotics, fluids, and other medicines. Follow these instructions at home: Medicines  Take over-the-counter and prescription medicines only as told by your health care provider.  If you were prescribed an antibiotic medicine, take it as told by your health care provider. Do not stop using the antibiotic even if you start to feel better. General instructions  Make sure you: ? Empty your bladder often and completely. Do not hold urine for long periods of time. ? Empty your bladder after   sex. ? Wipe from front to back after urinating or having a bowel movement if you are female. Use each tissue only one time when you wipe.  Drink enough fluid to keep your urine pale yellow.  Keep all follow-up visits. This is important.   Contact a health care provider if:  Your symptoms do not get better after 1-2  days.  Your symptoms go away and then return. Get help right away if:  You have severe pain in your back or your lower abdomen.  You have a fever or chills.  You have nausea or vomiting. Summary  A urinary tract infection (UTI) is an infection of any part of the urinary tract, which includes the kidneys, ureters, bladder, and urethra.  Most urinary tract infections are caused by bacteria in your genital area.  Treatment for this condition often includes antibiotic medicines.  If you were prescribed an antibiotic medicine, take it as told by your health care provider. Do not stop using the antibiotic even if you start to feel better.  Keep all follow-up visits. This is important. This information is not intended to replace advice given to you by your health care provider. Make sure you discuss any questions you have with your health care provider. Document Revised: 07/19/2020 Document Reviewed: 07/19/2020 [employer] Patient Education  2021 [employer]  

## 2021-04-10 LAB — URINE CULTURE

## 2021-06-13 ENCOUNTER — Encounter: Payer: Self-pay | Admitting: Nurse Practitioner

## 2021-06-13 ENCOUNTER — Ambulatory Visit: Payer: BC Managed Care – PPO | Admitting: Nurse Practitioner

## 2021-06-13 ENCOUNTER — Other Ambulatory Visit: Payer: Self-pay

## 2021-06-13 VITALS — BP 172/86 | HR 81 | Temp 97.1°F | Ht 62.0 in | Wt 165.0 lb

## 2021-06-13 DIAGNOSIS — R300 Dysuria: Secondary | ICD-10-CM | POA: Diagnosis not present

## 2021-06-13 LAB — URINALYSIS, ROUTINE W REFLEX MICROSCOPIC
Bilirubin, UA: NEGATIVE
Glucose, UA: NEGATIVE
Ketones, UA: NEGATIVE
Nitrite, UA: NEGATIVE
Protein,UA: NEGATIVE
Specific Gravity, UA: 1.015 (ref 1.005–1.030)
Urobilinogen, Ur: 0.2 mg/dL (ref 0.2–1.0)
pH, UA: 7 (ref 5.0–7.5)

## 2021-06-13 LAB — MICROSCOPIC EXAMINATION: WBC, UA: >30 /hpf — AB (ref 0–5)

## 2021-06-13 MED ORDER — NITROFURANTOIN MONOHYD MACRO 100 MG PO CAPS
100.0000 mg | ORAL_CAPSULE | Freq: Two times a day (BID) | ORAL | 0 refills | Status: DC
Start: 2021-06-13 — End: 2021-08-26

## 2021-06-13 MED ORDER — NITROFURANTOIN MONOHYD MACRO 100 MG PO CAPS: 100.0000 mg | ORAL_CAPSULE | Freq: Two times a day (BID) | ORAL | 0 refills | Status: DC

## 2021-06-13 NOTE — Progress Notes (Signed)
Acute Office Visit  Subjective:    Patient ID: Brooke Newton, female    DOB: February 23, 1961, 60 y.o.   MRN: [id number]  Chief Complaint  Patient presents with   urine odor   Back Pain    Urinary Tract Infection  This is a new problem. Episode onset: 3-4 days. The problem occurs intermittently. The problem has been unchanged. The quality of the pain is described as aching. The pain is moderate. There has been no fever. Associated symptoms include frequency. Pertinent negatives include no chills, discharge, flank pain or nausea. She has tried nothing for the symptoms.    Past Medical History:  Diagnosis Date   Hypertension    Shingles At age 57   Stroke [facility])    Thyroid disease    hypothyroidism    Past Surgical History:  Procedure Laterality Date   CESAREAN SECTION      Family History  Problem Relation Age of Onset   Hypertension Mother    Stroke Mother    Hypertension Brother     Social History   Socioeconomic History   Marital status: Married    Spouse name: Jonny Ruiz   Number of children: 2   Years of education: 12   Highest education level: Not on file  Occupational History   Occupation: self employed  Tobacco Use   Smoking status: Never   Smokeless tobacco: Never  Substance and Sexual Activity   Alcohol use: No   Drug use: No   Sexual activity: Yes  Other Topics Concern   Not on file  Social History Narrative   Not on file   Social Determinants of Health   Financial Resource Strain: Not on file  Food Insecurity: Not on file  Transportation Needs: Not on file  Physical Activity: Not on file  Stress: Not on file  Social Connections: Not on file  Intimate Partner Violence: Not on file    Outpatient Medications Prior to Visit  Medication Sig Dispense Refill   amLODipine (NORVASC) 10 MG tablet Take 1 tablet (10 mg total) by mouth daily. 90 tablet 1   aspirin 325 MG tablet Take 325 mg by mouth daily.     levothyroxine (SYNTHROID) 88 MCG tablet Take 1  tablet (88 mcg total) by mouth daily. 90 tablet 1   losartan (COZAAR) 100 MG tablet Take 1 tablet (100 mg total) by mouth daily. 90 tablet 1   rosuvastatin (CRESTOR) 10 MG tablet Take 1 tablet (10 mg total) by mouth daily. 90 tablet 1   cephALEXin (KEFLEX) 500 MG capsule Take 1 capsule (500 mg total) by mouth 2 (two) times daily. 14 capsule 0   No facility-administered medications prior to visit.     Review of Systems  Constitutional:  Negative for chills.  HENT: Negative.    Gastrointestinal:  Negative for nausea.  Genitourinary:  Positive for dysuria and frequency. Negative for flank pain.  Skin:  Negative for rash.  All other systems reviewed and are negative.     Objective:    Physical Exam Vitals and nursing note reviewed.  Constitutional:      Appearance: Normal appearance.  HENT:     Head: Normocephalic.     Nose: Nose normal.     Mouth/Throat:     Mouth: Mucous membranes are moist.     Pharynx: Oropharynx is clear.  Eyes:     Conjunctiva/sclera: Conjunctivae normal.  Cardiovascular:     Rate and Rhythm: Normal rate and regular rhythm.  Pulses: Normal pulses.     Heart sounds: Normal heart sounds.  Pulmonary:     Effort: Pulmonary effort is normal.     Breath sounds: Normal breath sounds.  Abdominal:     General: Bowel sounds are normal.  Skin:    Findings: No rash.  Neurological:     Mental Status: She is alert and oriented to person, place, and time.  Psychiatric:        Behavior: Behavior normal.    BP (!) 172/86   Pulse 81   Temp (!) 97.1 F (36.2 C) (Temporal)   Ht 5\' 2"  (1.575 m)   Wt 165 lb (74.8 kg)   BMI 30.18 kg/m  Wt Readings from Last 3 Encounters:  06/13/21 165 lb (74.8 kg)  04/07/21 168 lb (76.2 kg)  02/04/21 171 lb (77.6 kg)    Health Maintenance Due  Topic Date Due   Zoster Vaccines- Shingrix (1 of 2) Never done   DEXA SCAN  08/08/2020   COVID-19 Vaccine (3 - Booster for Moderna series) 09/01/2020   PAP SMEAR-Modifier   08/08/2021    There are no preventive care reminders to display for this patient.   Lab Results  Component Value Date   TSH 2.390 07/23/2020   Lab Results  Component Value Date   WBC 8.6 02/04/2021   HGB 14.9 02/04/2021   HCT 45.0 02/04/2021   MCV 90 02/04/2021   PLT 323 02/04/2021   Lab Results  Component Value Date   NA 142 02/04/2021   K 4.7 02/04/2021   CO2 22 02/04/2021   GLUCOSE 100 (H) 02/04/2021   BUN 6 (L) 02/04/2021   CREATININE 0.83 02/04/2021   BILITOT 0.3 02/04/2021   ALKPHOS 110 02/04/2021   AST 22 02/04/2021   ALT 19 02/04/2021   PROT 7.1 02/04/2021   ALBUMIN 4.4 02/04/2021   CALCIUM 9.7 02/04/2021   Lab Results  Component Value Date   CHOL 201 (H) 02/04/2021   Lab Results  Component Value Date   HDL 52 02/04/2021   Lab Results  Component Value Date   LDLCALC 132 (H) 02/04/2021   Lab Results  Component Value Date   TRIG 92 02/04/2021   Lab Results  Component Value Date   CHOLHDL 3.9 02/04/2021   Lab Results  Component Value Date   HGBA1C 6.0 (H) 09/28/2013       Assessment & Plan:   Problem List Items Addressed This Visit       Other   Dysuria - Primary    Urinalysis completed, results pending. Increase hydration, tylenol or ibuprofen for pain . Follow up with worsening or unresolved symptoms       Relevant Medications   nitrofurantoin, macrocrystal-monohydrate, (MACROBID) 100 MG capsule   Other Relevant Orders   Urinalysis, Routine w reflex microscopic (Completed)   Urine Culture     Meds ordered this encounter  Medications   DISCONTD: nitrofurantoin, macrocrystal-monohydrate, (MACROBID) 100 MG capsule    Sig: Take 1 capsule (100 mg total) by mouth 2 (two) times daily. 1 po BId    Dispense:  14 capsule    Refill:  0    Order Specific Question:   Supervising Provider    Answer:   11/28/2013 Raliegh Ip   nitrofurantoin, macrocrystal-monohydrate, (MACROBID) 100 MG capsule    Sig: Take 1 capsule (100 mg  total) by mouth 2 (two) times daily. 1 po BId    Dispense:  14 capsule    Refill:  0  Order Specific Question:   Supervising Provider    Answer:   Janora Norlander [[id number]]     Ivy Lynn, NP

## 2021-06-13 NOTE — Assessment & Plan Note (Signed)
Urinalysis completed, results pending. Increase hydration, tylenol or ibuprofen for pain . Follow up with worsening or unresolved symptoms

## 2021-06-13 NOTE — Patient Instructions (Signed)
Dysuria ?Dysuria is pain or discomfort during urination. The pain or discomfort may be felt in the part of the body that drains urine from the bladder (urethra) or in the surrounding tissue of the genitals. The pain may also be felt in the groin area, lower abdomen, or lower back. ?You may have to urinate frequently or have the sudden feeling that you have to urinate (urgency). Dysuria can affect anyone, but it is more common in females. Dysuria can be caused by many different things, including: ?Urinary tract infection. ?Kidney stones or bladder stones. ?Certain STIs (sexually transmitted infections), such as chlamydia. ?Dehydration. ?Inflammation of the tissues of the vagina. ?Use of certain medicines. ?Use of certain soaps or scented products that cause irritation. ?Follow these instructions at home: ?Medicines ?Take over-the-counter and prescription medicines only as told by your health care provider. ?If you were prescribed an antibiotic medicine, take it as told by your health care provider. Do not stop taking the antibiotic even if you start to feel better. ?Eating and drinking ? ?Drink enough fluid to keep your urine pale yellow. ?Avoid caffeinated beverages, tea, and alcohol. These beverages can irritate the bladder and make dysuria worse. In males, alcohol may irritate the prostate. ?General instructions ?Watch your condition for any changes. ?Urinate often. Avoid holding urine for long periods of time. ?If you are female, you should wipe from front to back after urinating or having a bowel movement. Use each piece of toilet paper only once. ?Empty your bladder after sex. ?Keep all follow-up visits. This is important. ?If you had any tests done to find the cause of dysuria, it is up to you to get your test results. Ask your health care provider, or the department that is doing the test, when your results will be ready. ?Contact a health care provider if: ?You have a fever. ?You develop pain in your back or  sides. ?You have nausea or vomiting. ?You have blood in your urine. ?You are not urinating as often as you usually do. ?Get help right away if: ?Your pain is severe and not relieved with medicines. ?You cannot eat or drink without vomiting. ?You are confused. ?You have a rapid heartbeat while resting. ?You have shaking or chills. ?You feel extremely weak. ?Summary ?Dysuria is pain or discomfort while urinating. Many different conditions can lead to dysuria. ?If you have dysuria, you may have to urinate frequently or have the sudden feeling that you have to urinate (urgency). ?Watch your condition for any changes. Keep all follow-up visits. ?Make sure that you urinate often and drink enough fluid to keep your urine pale yellow. ?This information is not intended to replace advice given to you by your health care provider. Make sure you discuss any questions you have with your health care provider. ?Document Revised: 07/19/2020 Document Reviewed: 07/19/2020 ?Elsevier Patient Education ? 2022 Elsevier Inc[employer] ? ?

## 2021-06-15 LAB — URINE CULTURE

## 2021-08-05 ENCOUNTER — Ambulatory Visit: Payer: Self-pay | Admitting: Nurse Practitioner

## 2021-08-12 DIAGNOSIS — Z1231 Encounter for screening mammogram for malignant neoplasm of breast: Secondary | ICD-10-CM | POA: Diagnosis not present

## 2021-08-26 ENCOUNTER — Encounter: Payer: Self-pay | Admitting: Nurse Practitioner

## 2021-08-26 ENCOUNTER — Other Ambulatory Visit: Payer: Self-pay

## 2021-08-26 ENCOUNTER — Ambulatory Visit (INDEPENDENT_AMBULATORY_CARE_PROVIDER_SITE_OTHER): Payer: BC Managed Care – PPO | Admitting: Nurse Practitioner

## 2021-08-26 VITALS — BP 142/78 | HR 78 | Temp 98.3°F | Resp 20 | Ht 62.0 in | Wt 161.0 lb

## 2021-08-26 DIAGNOSIS — I10 Essential (primary) hypertension: Secondary | ICD-10-CM

## 2021-08-26 DIAGNOSIS — E039 Hypothyroidism, unspecified: Secondary | ICD-10-CM

## 2021-08-26 DIAGNOSIS — E785 Hyperlipidemia, unspecified: Secondary | ICD-10-CM

## 2021-08-26 DIAGNOSIS — I6930 Unspecified sequelae of cerebral infarction: Secondary | ICD-10-CM | POA: Diagnosis not present

## 2021-08-26 DIAGNOSIS — Z6829 Body mass index (BMI) 29.0-29.9, adult: Secondary | ICD-10-CM

## 2021-08-26 MED ORDER — AMLODIPINE BESYLATE 10 MG PO TABS: 10.0000 mg | ORAL_TABLET | Freq: Every day | ORAL | 1 refills | Status: DC

## 2021-08-26 MED ORDER — ROSUVASTATIN CALCIUM 10 MG PO TABS
10.0000 mg | ORAL_TABLET | Freq: Every day | ORAL | 1 refills | Status: DC
Start: 2021-08-26 — End: 2022-02-24

## 2021-08-26 MED ORDER — LOSARTAN POTASSIUM 100 MG PO TABS
100.0000 mg | ORAL_TABLET | Freq: Every day | ORAL | 1 refills | Status: DC
Start: 2021-08-26 — End: 2022-02-24

## 2021-08-26 MED ORDER — LEVOTHYROXINE SODIUM 88 MCG PO TABS: 88.0000 ug | ORAL_TABLET | Freq: Every day | ORAL | 1 refills | Status: DC

## 2021-08-26 NOTE — Progress Notes (Signed)
Subjective:    Patient ID: Brooke Newton, female    DOB: 31-Aug-1961, 60 y.o.   MRN: [id number]  Chief Complaint: medical management of chronic issues     HPI:  1. Essential hypertension, benign No c/o chest pain, sob or headache. Does  check blood pressure at homeand runs 140's systolic. BP Readings from Last 3 Encounters:  08/26/21 (!) 158/95  06/13/21 (!) 172/86  04/07/21 129/80      2. Hyperlipidemia with target LDL less than 100 Does try to watch diet. Doe snot exercise very often. Lab Results  Component Value Date   CHOL 201 (H) 02/04/2021   HDL 52 02/04/2021   LDLCALC 132 (H) 02/04/2021   TRIG 92 02/04/2021   CHOLHDL 3.9 02/04/2021     3. Acquired hypothyroidism No problems that she is aware of. Lab Results  Component Value Date   TSH 2.390 07/23/2020     4. Late effect of cerebrovascular accident Has no permanent affects from her stroke.  5. BMI 30.0-30.9,adult Weight down 4 lbs Wt Readings from Last 3 Encounters:  08/26/21 161 lb (73 kg)  06/13/21 165 lb (74.8 kg)  04/07/21 168 lb (76.2 kg)   BMI Readings from Last 3 Encounters:  08/26/21 29.45 kg/m  06/13/21 30.18 kg/m  04/07/21 30.73 kg/m       Outpatient Encounter Medications as of 08/26/2021  Medication Sig   amLODipine (NORVASC) 10 MG tablet Take 1 tablet (10 mg total) by mouth daily.   aspirin 325 MG tablet Take 325 mg by mouth daily.   levothyroxine (SYNTHROID) 88 MCG tablet Take 1 tablet (88 mcg total) by mouth daily.   losartan (COZAAR) 100 MG tablet Take 1 tablet (100 mg total) by mouth daily.   nitrofurantoin, macrocrystal-monohydrate, (MACROBID) 100 MG capsule Take 1 capsule (100 mg total) by mouth 2 (two) times daily. 1 po BId   rosuvastatin (CRESTOR) 10 MG tablet Take 1 tablet (10 mg total) by mouth daily.   No facility-administered encounter medications on file as of 08/26/2021.    Past Surgical History:  Procedure Laterality Date   CESAREAN SECTION      Family  History  Problem Relation Age of Onset   Hypertension Mother    Stroke Mother    Hypertension Brother     New complaints: None otday  Social history: Lives with her husband. Her is getting marrried this month  Controlled substance contract: n/a     Review of Systems  Constitutional:  Negative for diaphoresis.  Eyes:  Negative for pain.  Respiratory:  Negative for shortness of breath.   Cardiovascular:  Negative for chest pain, palpitations and leg swelling.  Gastrointestinal:  Negative for abdominal pain.  Endocrine: Negative for polydipsia.  Skin:  Negative for rash.  Neurological:  Negative for dizziness, weakness and headaches.  Hematological:  Does not bruise/bleed easily.  All other systems reviewed and are negative.     Objective:   Physical Exam Vitals and nursing note reviewed.  Constitutional:      General: She is not in acute distress.    Appearance: Normal appearance. She is well-developed.  HENT:     Head: Normocephalic.     Right Ear: Tympanic membrane normal.     Left Ear: Tympanic membrane normal.     Nose: Nose normal.     Mouth/Throat:     Mouth: Mucous membranes are moist.  Eyes:     Pupils: Pupils are equal, round, and reactive to light.  Neck:  Vascular: No carotid bruit or JVD.  Cardiovascular:     Rate and Rhythm: Normal rate and regular rhythm.     Heart sounds: Normal heart sounds.  Pulmonary:     Effort: Pulmonary effort is normal. No respiratory distress.     Breath sounds: Normal breath sounds. No wheezing or rales.  Chest:     Chest wall: No tenderness.  Abdominal:     General: Bowel sounds are normal. There is no distension or abdominal bruit.     Palpations: Abdomen is soft. There is no hepatomegaly, splenomegaly, mass or pulsatile mass.     Tenderness: There is no abdominal tenderness.  Musculoskeletal:        General: Normal range of motion.     Cervical back: Normal range of motion and neck supple.  Lymphadenopathy:      Cervical: No cervical adenopathy.  Skin:    General: Skin is warm and dry.  Neurological:     Mental Status: She is alert and oriented to person, place, and time.     Deep Tendon Reflexes: Reflexes are normal and symmetric.  Psychiatric:        Behavior: Behavior normal.        Thought Content: Thought content normal.        Judgment: Judgment normal.     BP (!) 158/95   Pulse 78   Temp 98.3 F (36.8 C) (Temporal)   Resp 20   Ht 5\' 2"  (1.575 m)   Wt 161 lb (73 kg)   SpO2 96%   BMI 29.45 kg/m       Assessment & Plan:   Brooke Newton comes in today with chief complaint of Medical Management of Chronic Issues   Diagnosis and orders addressed:  1. Essential hypertension, benign Low sodium diet - losartan (COZAAR) 100 MG tablet; Take 1 tablet (100 mg total) by mouth daily.  Dispense: 90 tablet; Refill: 1 - amLODipine (NORVASC) 10 MG tablet; Take 1 tablet (10 mg total) by mouth daily.  Dispense: 90 tablet; Refill: 1  2. Hyperlipidemia with target LDL less than 100 Low fat diet - rosuvastatin (CRESTOR) 10 MG tablet; Take 1 tablet (10 mg total) by mouth daily.  Dispense: 90 tablet; Refill: 1  3. Acquired hypothyroidism Labs pending - levothyroxine (SYNTHROID) 88 MCG tablet; Take 1 tablet (88 mcg total) by mouth daily.  Dispense: 90 tablet; Refill: 1  4. Late effect of cerebrovascular accident Take daily baby aspirin  5. BMI 29.0-29.9,adult Discussed diet and exercise for person with BMI >25 Will recheck weight in 3-6 months    Labs pending Health Maintenance reviewed Diet and exercise encouraged  Follow up plan: 6 months   Mary-Margaret 09-18-1986, FNP

## 2021-08-26 NOTE — Patient Instructions (Signed)
Heart-Healthy Eating Plan Heart-healthy meal planning includes: Eating less unhealthy fats. Eating more healthy fats. Making other changes in your diet. Talk with your doctor or a diet specialist (dietitian) to create an eating plan that is right for you. What is my plan? Your doctor may recommend an eating plan that includes: Total fat: ______% or less of total calories a day. Saturated fat: ______% or less of total calories a day. Cholesterol: less than _________mg a day. What are tips for following this plan? Cooking Avoid frying your food. Try to bake, boil, grill, or broil it instead. You can also reduce fat by: Removing the skin from poultry. Removing all visible fats from meats. Steaming vegetables in water or broth. Meal planning  At meals, divide your plate into four equal parts: Fill one-half of your plate with vegetables and green salads. Fill one-fourth of your plate with whole grains. Fill one-fourth of your plate with lean protein foods. Eat 4-5 servings of vegetables per day. A serving of vegetables is: 1 cup of raw or cooked vegetables. 2 cups of raw leafy greens. Eat 4-5 servings of fruit per day. A serving of fruit is: 1 medium whole fruit.  cup of dried fruit.  cup of fresh, frozen, or canned fruit.  cup of 100% fruit juice. Eat more foods that have soluble fiber. These are apples, broccoli, carrots, beans, peas, and barley. Try to get 20-30 g of fiber per day. Eat 4-5 servings of nuts, legumes, and seeds per week: 1 serving of dried beans or legumes equals  cup after being cooked. 1 serving of nuts is  cup. 1 serving of seeds equals 1 tablespoon. General information Eat more home-cooked food. Eat less restaurant, buffet, and fast food. Limit or avoid alcohol. Limit foods that are high in starch and sugar. Avoid fried foods. Lose weight if you are overweight. Keep track of how much salt (sodium) you eat. This is important if you have high blood  pressure. Ask your doctor to tell you more about this. Try to add vegetarian meals each week. Fats Choose healthy fats. These include olive oil and canola oil, flaxseeds, walnuts, almonds, and seeds. Eat more omega-3 fats. These include salmon, mackerel, sardines, tuna, flaxseed oil, and ground flaxseeds. Try to eat fish at least 2 times each week. Check food labels. Avoid foods with trans fats or high amounts of saturated fat. Limit saturated fats. These are often found in animal products, such as meats, butter, and cream. These are also found in plant foods, such as palm oil, palm kernel oil, and coconut oil. Avoid foods with partially hydrogenated oils in them. These have trans fats. Examples are stick margarine, some tub margarines, cookies, crackers, and other baked goods. What foods can I eat? Fruits All fresh, canned (in natural juice), or frozen fruits. Vegetables Fresh or frozen vegetables (raw, steamed, roasted, or grilled). Green salads. Grains Most grains. Choose whole wheat and whole grains most of the time. Rice and pasta, including brown rice and pastas made with whole wheat. Meats and other proteins Lean, well-trimmed beef, veal, pork, and lamb. Chicken and turkey without skin. All fish and shellfish. Wild duck, rabbit, pheasant, and venison. Egg whites or low-cholesterol egg substitutes. Dried beans, peas, lentils, and tofu. Seeds and most nuts. Dairy Low-fat or nonfat cheeses, including ricotta and mozzarella. Skim or 1% milk that is liquid, powdered, or evaporated. Buttermilk that is made with low-fat milk. Nonfat or low-fat yogurt. Fats and oils Non-hydrogenated (trans-free) margarines. Vegetable oils, including   soybean, sesame, sunflower, olive, peanut, safflower, corn, canola, and cottonseed. Salad dressings or mayonnaise made with a vegetable oil. Beverages Mineral water. Coffee and tea. Diet carbonated beverages. Sweets and desserts Sherbet, gelatin, and fruit ice.  Small amounts of dark chocolate. Limit all sweets and desserts. Seasonings and condiments All seasonings and condiments. The items listed above may not be a complete list of foods and drinks you can eat. Contact a dietitian for more options. What foods should I avoid? Fruits Canned fruit in heavy syrup. Fruit in cream or butter sauce. Fried fruit. Limit coconut. Vegetables Vegetables cooked in cheese, cream, or butter sauce. Fried vegetables. Grains Breads that are made with saturated or trans fats, oils, or whole milk. Croissants. Sweet rolls. Donuts. High-fat crackers, such as cheese crackers. Meats and other proteins Fatty meats, such as hot dogs, ribs, sausage, bacon, rib-eye roast or steak. High-fat deli meats, such as salami and bologna. Caviar. Domestic duck and goose. Organ meats, such as liver. Dairy Cream, sour cream, cream cheese, and creamed cottage cheese. Whole-milk cheeses. Whole or 2% milk that is liquid, evaporated, or condensed. Whole buttermilk. Cream sauce or high-fat cheese sauce. Yogurt that is made from whole milk. Fats and oils Meat fat, or shortening. Cocoa butter, hydrogenated oils, palm oil, coconut oil, palm kernel oil. Solid fats and shortenings, including bacon fat, salt pork, lard, and butter. Nondairy cream substitutes. Salad dressings with cheese or sour cream. Beverages Regular sodas and juice drinks with added sugar. Sweets and desserts Frosting. Pudding. Cookies. Cakes. Pies. Milk chocolate or white chocolate. Buttered syrups. Full-fat ice cream or ice cream drinks. The items listed above may not be a complete list of foods and drinks to avoid. Contact a dietitian for more information. Summary Heart-healthy meal planning includes eating less unhealthy fats, eating more healthy fats, and making other changes in your diet. Eat a balanced diet. This includes fruits and vegetables, low-fat or nonfat dairy, lean protein, nuts and legumes, whole grains, and  heart-healthy oils and fats. This information is not intended to replace advice given to you by your health care provider. Make sure you discuss any questions you have with your health care provider. Document Revised: 04/17/2021 Document Reviewed: 04/17/2021 Elsevier Patient Education  2022 [employer] Inc.  

## 2022-02-24 ENCOUNTER — Ambulatory Visit: Payer: BC Managed Care – PPO | Admitting: Nurse Practitioner

## 2022-02-24 ENCOUNTER — Encounter: Payer: Self-pay | Admitting: Nurse Practitioner

## 2022-02-24 VITALS — BP 138/80 | HR 78 | Temp 97.1°F | Resp 20 | Ht 62.0 in | Wt 161.0 lb

## 2022-02-24 DIAGNOSIS — I6930 Unspecified sequelae of cerebral infarction: Secondary | ICD-10-CM

## 2022-02-24 DIAGNOSIS — I10 Essential (primary) hypertension: Secondary | ICD-10-CM

## 2022-02-24 DIAGNOSIS — Z6830 Body mass index (BMI) 30.0-30.9, adult: Secondary | ICD-10-CM

## 2022-02-24 DIAGNOSIS — E785 Hyperlipidemia, unspecified: Secondary | ICD-10-CM

## 2022-02-24 DIAGNOSIS — E039 Hypothyroidism, unspecified: Secondary | ICD-10-CM

## 2022-02-24 LAB — LIPID PANEL

## 2022-02-24 MED ORDER — AMLODIPINE BESYLATE 10 MG PO TABS: 10.0000 mg | ORAL_TABLET | Freq: Every day | ORAL | 1 refills | Status: DC

## 2022-02-24 MED ORDER — ROSUVASTATIN CALCIUM 10 MG PO TABS: 10.0000 mg | ORAL_TABLET | Freq: Every day | ORAL | 1 refills | Status: DC

## 2022-02-24 MED ORDER — LEVOTHYROXINE SODIUM 88 MCG PO TABS: 88.0000 ug | ORAL_TABLET | Freq: Every day | ORAL | 1 refills | Status: DC

## 2022-02-24 MED ORDER — LOSARTAN POTASSIUM 100 MG PO TABS: 100.0000 mg | ORAL_TABLET | Freq: Every day | ORAL | 1 refills | Status: DC

## 2022-02-24 NOTE — Patient Instructions (Signed)
Exercising to Stay Healthy °To become healthy and stay healthy, it is recommended that you do moderate-intensity and vigorous-intensity exercise. You can tell that you are exercising at a moderate intensity if your heart starts beating faster and you start breathing faster but can still hold a conversation. You can tell that you are exercising at a vigorous intensity if you are breathing much harder and faster and cannot hold a conversation while exercising. °How can exercise benefit me? °Exercising regularly is important. It has many health benefits, such as: °Improving overall fitness, flexibility, and endurance. °Increasing bone density. °Helping with weight control. °Decreasing body fat. °Increasing muscle strength and endurance. °Reducing stress and tension, anxiety, depression, or anger. °Improving overall health. °What guidelines should I follow while exercising? °Before you start a new exercise program, talk with your health care provider. °Do not exercise so much that you hurt yourself, feel dizzy, or get very short of breath. °Wear comfortable clothes and wear shoes with good support. °Drink plenty of water while you exercise to prevent dehydration or heat stroke. °Work out until your breathing and your heartbeat get faster (moderate intensity). °How often should I exercise? °Choose an activity that you enjoy, and set realistic goals. Your health care provider can help you make an activity plan that is individually designed and works best for you. °Exercise regularly as told by your health care provider. This may include: °Doing strength training two times a week, such as: °Lifting weights. °Using resistance bands. °Push-ups. °Sit-ups. °Yoga. °Doing a certain intensity of exercise for a given amount of time. Choose from these options: °A total of 150 minutes of moderate-intensity exercise every week. °A total of 75 minutes of vigorous-intensity exercise every week. °A mix of moderate-intensity and  vigorous-intensity exercise every week. °Children, pregnant women, people who have not exercised regularly, people who are overweight, and older adults may need to talk with a health care provider about what activities are safe to perform. If you have a medical condition, be sure to talk with your health care provider before you start a new exercise program. °What are some exercise ideas? °Moderate-intensity exercise ideas include: °Walking 1 mile (1.6 km) in about 15 minutes. °Biking. °Hiking. °Golfing. °Dancing. °Water aerobics. °Vigorous-intensity exercise ideas include: °Walking 4.5 miles (7.2 km) or more in about 1 hour. °Jogging or running 5 miles (8 km) in about 1 hour. °Biking 10 miles (16.1 km) or more in about 1 hour. °Lap swimming. °Roller-skating or in-line skating. °Cross-country skiing. °Vigorous competitive sports, such as football, basketball, and soccer. °Jumping rope. °Aerobic dancing. °What are some everyday activities that can help me get exercise? °Yard work, such as: °Pushing a lawn mower. °Raking and bagging leaves. °Washing your car. °Pushing a stroller. °Shoveling snow. °Gardening. °Washing windows or floors. °How can I be more active in my day-to-day activities? °Use stairs instead of an elevator. °Take a walk during your lunch break. °If you drive, park your car farther away from your work or school. °If you take public transportation, get off one stop early and walk the rest of the way. °Stand up or walk around during all of your indoor phone calls. °Get up, stretch, and walk around every 30 minutes throughout the day. °Enjoy exercise with a friend. Support to continue exercising will help you keep a regular routine of activity. °Where to find more information °You can find more information about exercising to stay healthy from: °[place] Department of Health and Human Services: www.hhs.gov °Centers for Disease Control and Prevention (  CDC): www.cdc.gov °Summary °Exercising regularly is  important. It will improve your overall fitness, flexibility, and endurance. °Regular exercise will also improve your overall health. It can help you control your weight, reduce stress, and improve your bone density. °Do not exercise so much that you hurt yourself, feel dizzy, or get very short of breath. °Before you start a new exercise program, talk with your health care provider. °This information is not intended to replace advice given to you by your health care provider. Make sure you discuss any questions you have with your health care provider. °Document Revised: 04/04/2021 Document Reviewed: 04/04/2021 °Elsevier Patient Education © 2022 Elsevier Inc. ° °

## 2022-02-24 NOTE — Progress Notes (Signed)
? ?Subjective:  ? ? Patient ID: Brooke Newton, female    DOB: 1961/06/20, 61 y.o.   MRN: [id number] ? ? ?Chief Complaint: medical management of chronic issues  ?  ? ?HPI: ? ?Brooke Newton is a 61 y.o. who identifies as a female who was assigned female at birth.  ? ?Social history: ?Lives with: her husband- BOth of her parent shave passed away since her last visit ?Work history: Emergency planning/management officer ? ? ?Comes in today for follow up of the following chronic medical issues: ? ?1. Essential hypertension, benign ?No c/o chest pain, sob or headache. Doe snot check blood pressure at home.\ ?BP Readings from Last 3 Encounters:  ?02/24/22 (!) 152/89  ?08/26/21 (!) 142/78  ?06/13/21 (!) 172/86  ? ? ? ? ?2. Hyperlipidemia with target LDL less than 100 ?Does try to watch diet and stays very active. ?Lab Results  ?Component Value Date  ? CHOL 201 (H) 02/04/2021  ? HDL 52 02/04/2021  ? LDLCALC 132 (H) 02/04/2021  ? TRIG 92 02/04/2021  ? CHOLHDL 3.9 02/04/2021  ? ?The 10-year ASCVD risk score (Arnett DK, et al., 2019) is: 6.9% ? ? ?3. Acquired hypothyroidism ?No problems that she is aware of. ?Lab Results  ?Component Value Date  ? TSH 2.390 07/23/2020  ? ? ? ?4. Late effect of cerebrovascular accident ?Has no residual effects.  ? ?5. BMI 30.0-30.9,adult ?No recent weight changes ?Wt Readings from Last 3 Encounters:  ?02/24/22 161 lb (73 kg)  ?08/26/21 161 lb (73 kg)  ?06/13/21 165 lb (74.8 kg)  ? ?BMI Readings from Last 3 Encounters:  ?02/24/22 29.45 kg/m?  ?08/26/21 29.45 kg/m?  ?06/13/21 30.18 kg/m?  ? ? ? ?New complaints: ?None today ? ?No Known Allergies ?Outpatient Encounter Medications as of 02/24/2022  ?Medication Sig  ? amLODipine (NORVASC) 10 MG tablet Take 1 tablet (10 mg total) by mouth daily.  ? aspirin 325 MG tablet Take 325 mg by mouth daily.  ? levothyroxine (SYNTHROID) 88 MCG tablet Take 1 tablet (88 mcg total) by mouth daily.  ? losartan (COZAAR) 100 MG tablet Take 1 tablet (100 mg total) by mouth daily.  ? rosuvastatin  (CRESTOR) 10 MG tablet Take 1 tablet (10 mg total) by mouth daily.  ? ?No facility-administered encounter medications on file as of 02/24/2022.  ? ? ?Past Surgical History:  ?Procedure Laterality Date  ? CESAREAN SECTION    ? ? ?Family History  ?Problem Relation Age of Onset  ? Hypertension Mother   ? Stroke Mother   ? Hypertension Brother   ? ? ? ? ?Controlled substance contract: n/a ? ? ? ? ?Review of Systems  ?Constitutional:  Negative for diaphoresis.  ?Eyes:  Negative for pain.  ?Respiratory:  Negative for shortness of breath.   ?Cardiovascular:  Negative for chest pain, palpitations and leg swelling.  ?Gastrointestinal:  Negative for abdominal pain.  ?Endocrine: Negative for polydipsia.  ?Skin:  Negative for rash.  ?Neurological:  Negative for dizziness, weakness and headaches.  ?Hematological:  Does not bruise/bleed easily.  ?All other systems reviewed and are negative. ? ?   ?Objective:  ? Physical Exam ?Vitals and nursing note reviewed.  ?Constitutional:   ?   General: She is not in acute distress. ?   Appearance: Normal appearance. She is well-developed.  ?HENT:  ?   Head: Normocephalic.  ?   Right Ear: Tympanic membrane normal.  ?   Left Ear: Tympanic membrane normal.  ?   Nose: Nose normal.  ?  Mouth/Throat:  ?   Mouth: Mucous membranes are moist.  ?Eyes:  ?   Pupils: Pupils are equal, round, and reactive to light.  ?Neck:  ?   Vascular: No carotid bruit or JVD.  ?Cardiovascular:  ?   Rate and Rhythm: Normal rate and regular rhythm.  ?   Heart sounds: Normal heart sounds.  ?Pulmonary:  ?   Effort: Pulmonary effort is normal. No respiratory distress.  ?   Breath sounds: Normal breath sounds. No wheezing or rales.  ?Chest:  ?   Chest wall: No tenderness.  ?Abdominal:  ?   General: Bowel sounds are normal. There is no distension or abdominal bruit.  ?   Palpations: Abdomen is soft. There is no hepatomegaly, splenomegaly, mass or pulsatile mass.  ?   Tenderness: There is no abdominal tenderness.   ?Musculoskeletal:     ?   General: Normal range of motion.  ?   Cervical back: Normal range of motion and neck supple.  ?Lymphadenopathy:  ?   Cervical: No cervical adenopathy.  ?Skin: ?   General: Skin is warm and dry.  ?Neurological:  ?   Mental Status: She is alert and oriented to person, place, and time.  ?   Deep Tendon Reflexes: Reflexes are normal and symmetric.  ?Psychiatric:     ?   Behavior: Behavior normal.     ?   Thought Content: Thought content normal.     ?   Judgment: Judgment normal.  ? ?BP 138/80 Comment: at home  Pulse 78   Temp (!) 97.1 ?F (36.2 ?C) (Temporal)   Resp 20   Ht '5\' 2"'  (1.575 m)   Wt 161 lb (73 kg)   SpO2 91%   BMI 29.45 kg/m?  ? ? ? ? ?   ?Assessment & Plan:  ? ?[name] comes in today with chief complaint of Medical Management of Chronic Issues ? ? ?Diagnosis and orders addressed: ? ?1. Essential hypertension, benign ?Low sodium diet ?- losartan (COZAAR) 100 MG tablet; Take 1 tablet (100 mg total) by mouth daily.  Dispense: 90 tablet; Refill: 1 ?- amLODipine (NORVASC) 10 MG tablet; Take 1 tablet (10 mg total) by mouth daily.  Dispense: 90 tablet; Refill: 1 ?- CBC with Differential/Platelet ?- CMP14+EGFR ? ?2. Hyperlipidemia with target LDL less than 100 ?Low fat diet ?- rosuvastatin (CRESTOR) 10 MG tablet; Take 1 tablet (10 mg total) by mouth daily.  Dispense: 90 tablet; Refill: 1 ?- Lipid panel ? ?3. Acquired hypothyroidism ?Labs Pending ?- levothyroxine (SYNTHROID) 88 MCG tablet; Take 1 tablet (88 mcg total) by mouth daily.  Dispense: 90 tablet; Refill: 1 ? ?4. Late effect of cerebrovascular accident ? ? ?5. BMI 30.0-30.9,adult ?Discussed diet and exercise for person with BMI >25 ?Will recheck weight in 3-6 months ? ? ? ?Labs pending ?Health Maintenance reviewed- will schedule dexascan ?Diet and exercise encouraged ? ?Follow up plan: ?6 months ? ? ?Mary-Margaret Hassell Done, FNP ? ?

## 2022-02-25 LAB — LIPID PANEL
Chol/HDL Ratio: 3.7 ratio (ref 0.0–4.4)
Cholesterol, Total: 216 mg/dL — ABNORMAL HIGH (ref 100–199)
HDL: 58 mg/dL (ref >39)
LDL Chol Calc (NIH): 147 mg/dL — ABNORMAL HIGH (ref 0–99)
Triglycerides: 65 mg/dL (ref 0–149)
VLDL Cholesterol Cal: 11 mg/dL (ref 5–40)

## 2022-02-25 LAB — CBC WITH DIFFERENTIAL/PLATELET
Basophils Absolute: 0 10*3/uL (ref 0.0–0.2)
Basos: 1 %
EOS (ABSOLUTE): 0 10*3/uL (ref 0.0–0.4)
Eos: 1 %
Hematocrit: 43.8 % (ref 34.0–46.6)
Hemoglobin: 14.6 g/dL (ref 11.1–15.9)
Immature Grans (Abs): 0 10*3/uL (ref 0.0–0.1)
Immature Granulocytes: 0 %
Lymphocytes Absolute: 1.8 10*3/uL (ref 0.7–3.1)
Lymphs: 21 %
MCH: 29.9 pg (ref 26.6–33.0)
MCHC: 33.3 g/dL (ref 31.5–35.7)
MCV: 90 fL (ref 79–97)
Monocytes Absolute: 0.4 10*3/uL (ref 0.1–0.9)
Monocytes: 5 %
Neutrophils Absolute: 6.3 10*3/uL (ref 1.4–7.0)
Neutrophils: 72 %
Platelets: 334 10*3/uL (ref 150–450)
RBC: 4.89 x10E6/uL (ref 3.77–5.28)
RDW: 13.6 % (ref 11.7–15.4)
WBC: 8.6 10*3/uL (ref 3.4–10.8)

## 2022-02-25 LAB — CMP14+EGFR
ALT: 22 IU/L (ref 0–32)
AST: 22 IU/L (ref 0–40)
Albumin/Globulin Ratio: 1.7 (ref 1.2–2.2)
Albumin: 4.5 g/dL (ref 3.8–4.8)
Alkaline Phosphatase: 108 IU/L (ref 44–121)
BUN/Creatinine Ratio: 15 (ref 12–28)
BUN: 12 mg/dL (ref 8–27)
Bilirubin Total: 0.4 mg/dL (ref 0.0–1.2)
CO2: 21 mmol/L (ref 20–29)
Calcium: 10 mg/dL (ref 8.7–10.3)
Chloride: 103 mmol/L (ref 96–106)
Creatinine, Ser: 0.81 mg/dL (ref 0.57–1.00)
Globulin, Total: 2.7 g/dL (ref 1.5–4.5)
Glucose: 98 mg/dL (ref 70–99)
Potassium: 4.8 mmol/L (ref 3.5–5.2)
Sodium: 140 mmol/L (ref 134–144)
Total Protein: 7.2 g/dL (ref 6.0–8.5)
eGFR: 82 mL/min/{1.73_m2} (ref >59)

## 2022-03-02 ENCOUNTER — Other Ambulatory Visit: Payer: Self-pay

## 2022-03-02 ENCOUNTER — Ambulatory Visit (INDEPENDENT_AMBULATORY_CARE_PROVIDER_SITE_OTHER): Payer: BC Managed Care – PPO

## 2022-03-02 DIAGNOSIS — Z780 Asymptomatic menopausal state: Secondary | ICD-10-CM

## 2022-03-02 DIAGNOSIS — Z23 Encounter for immunization: Secondary | ICD-10-CM

## 2022-03-03 DIAGNOSIS — Z780 Asymptomatic menopausal state: Secondary | ICD-10-CM | POA: Diagnosis not present

## 2022-03-03 DIAGNOSIS — M8589 Other specified disorders of bone density and structure, multiple sites: Secondary | ICD-10-CM | POA: Diagnosis not present

## 2022-05-04 ENCOUNTER — Ambulatory Visit (INDEPENDENT_AMBULATORY_CARE_PROVIDER_SITE_OTHER): Payer: BC Managed Care – PPO | Admitting: Emergency Medicine

## 2022-05-04 DIAGNOSIS — Z23 Encounter for immunization: Secondary | ICD-10-CM

## 2022-05-11 ENCOUNTER — Encounter: Payer: Self-pay | Admitting: Nurse Practitioner

## 2022-05-11 ENCOUNTER — Ambulatory Visit: Payer: BC Managed Care – PPO | Admitting: Nurse Practitioner

## 2022-05-11 VITALS — BP 175/88 | HR 72 | Temp 97.4°F | Resp 20 | Ht 62.0 in | Wt 164.0 lb

## 2022-05-11 DIAGNOSIS — M5442 Lumbago with sciatica, left side: Secondary | ICD-10-CM

## 2022-05-11 MED ORDER — KETOROLAC TROMETHAMINE 60 MG/2ML IM SOLN
60.0000 mg | Freq: Once | INTRAMUSCULAR | Status: AC
  Administered 2022-05-11: 60 mg via INTRAMUSCULAR

## 2022-05-11 MED ORDER — PREDNISONE 20 MG PO TABS: 40.0000 mg | ORAL_TABLET | Freq: Every day | ORAL | 0 refills | Status: AC

## 2022-05-11 MED ORDER — METHYLPREDNISOLONE ACETATE 80 MG/ML IJ SUSP
80.0000 mg | Freq: Once | INTRAMUSCULAR | Status: AC
  Administered 2022-05-11: 80 mg via INTRAMUSCULAR

## 2022-05-11 MED ORDER — CYCLOBENZAPRINE HCL 10 MG PO TABS: 10.0000 mg | ORAL_TABLET | Freq: Three times a day (TID) | ORAL | 1 refills | Status: DC | PRN

## 2022-05-11 NOTE — Patient Instructions (Signed)
Acute Back Pain, Adult Acute back pain is sudden and usually short-lived. It is often caused by an injury to the muscles and tissues in the back. The injury may result from: A muscle, tendon, or ligament getting overstretched or torn. Ligaments are tissues that connect bones to each other. Lifting something improperly can cause a back strain. Wear and tear (degeneration) of the spinal disks. Spinal disks are circular tissue that provide cushioning between the bones of the spine (vertebrae). Twisting motions, such as while playing sports or doing yard work. A hit to the back. Arthritis. You may have a physical exam, lab tests, and imaging tests to find the cause of your pain. Acute back pain usually goes away with rest and home care. Follow these instructions at home: Managing pain, stiffness, and swelling Take over-the-counter and prescription medicines only as told by your health care provider. Treatment may include medicines for pain and inflammation that are taken by mouth or applied to the skin, or muscle relaxants. Your health care provider may recommend applying ice during the first 24-48 hours after your pain starts. To do this: Put ice in a plastic bag. Place a towel between your skin and the bag. Leave the ice on for 20 minutes, 2-3 times a day. Remove the ice if your skin turns bright red. This is very important. If you cannot feel pain, heat, or cold, you have a greater risk of damage to the area. If directed, apply heat to the affected area as often as told by your health care provider. Use the heat source that your health care provider recommends, such as a moist heat pack or a heating pad. Place a towel between your skin and the heat source. Leave the heat on for 20-30 minutes. Remove the heat if your skin turns bright red. This is especially important if you are unable to feel pain, heat, or cold. You have a greater risk of getting burned. Activity  Do not stay in bed. Staying in  bed for more than 1-2 days can delay your recovery. Sit up and stand up straight. Avoid leaning forward when you sit or hunching over when you stand. If you work at a desk, sit close to it so you do not need to lean over. Keep your chin tucked in. Keep your neck drawn back, and keep your elbows bent at a 90-degree angle (right angle). Sit high and close to the steering wheel when you drive. Add lower back (lumbar) support to your car seat, if needed. Take short walks on even surfaces as soon as you are able. Try to increase the length of time you walk each day. Do not sit, drive, or stand in one place for more than 30 minutes at a time. Sitting or standing for long periods of time can put stress on your back. Do not drive or use heavy machinery while taking prescription pain medicine. Use proper lifting techniques. When you bend and lift, use positions that put less stress on your back: Bend your knees. Keep the load close to your body. Avoid twisting. Exercise regularly as told by your health care provider. Exercising helps your back heal faster and helps prevent back injuries by keeping muscles strong and flexible. Work with a physical therapist to make a safe exercise program, as recommended by your health care provider. Do any exercises as told by your physical therapist. Lifestyle Maintain a healthy weight. Extra weight puts stress on your back and makes it difficult to have good   posture. Avoid activities or situations that make you feel anxious or stressed. Stress and anxiety increase muscle tension and can make back pain worse. Learn ways to manage anxiety and stress, such as through exercise. General instructions Sleep on a firm mattress in a comfortable position. Try lying on your side with your knees slightly bent. If you lie on your back, put a pillow under your knees. Keep your head and neck in a straight line with your spine (neutral position) when using electronic equipment like  smartphones or pads. To do this: Raise your smartphone or pad to look at it instead of bending your head or neck to look down. Put the smartphone or pad at the level of your face while looking at the screen. Follow your treatment plan as told by your health care provider. This may include: Cognitive or behavioral therapy. Acupuncture or massage therapy. Meditation or yoga. Contact a health care provider if: You have pain that is not relieved with rest or medicine. You have increasing pain going down into your legs or buttocks. Your pain does not improve after 2 weeks. You have pain at night. You lose weight without trying. You have a fever or chills. You develop nausea or vomiting. You develop abdominal pain. Get help right away if: You develop new bowel or bladder control problems. You have unusual weakness or numbness in your arms or legs. You feel faint. These symptoms may represent a serious problem that is an emergency. Do not wait to see if the symptoms will go away. Get medical help right away. Call your local emergency services (911 in the [place] Do not drive yourself to the hospital. Summary Acute back pain is sudden and usually short-lived. Use proper lifting techniques. When you bend and lift, use positions that put less stress on your back. Take over-the-counter and prescription medicines only as told by your health care provider, and apply heat or ice as told. This information is not intended to replace advice given to you by your health care provider. Make sure you discuss any questions you have with your health care provider. Document Revised: 02/28/2021 Document Reviewed: 02/28/2021 Elsevier Patient Education  2023 Elsevier Inc.  

## 2022-05-11 NOTE — Progress Notes (Signed)
Subjective:    Patient ID: Brooke Newton, female    DOB: 1961/09/10, 61 y.o.   MRN: [id number]   Chief Complaint: Back Pain (Pain radiating down left leg/)   Back Pain This is a new problem. The current episode started in the past 7 days. The problem occurs constantly. The quality of the pain is described as burning, shooting and stabbing. The pain radiates to the left knee. The pain is at a severity of 9/10. The pain is severe. The pain is The same all the time. The symptoms are aggravated by bending, twisting, standing and lying down. Pertinent negatives include no abdominal pain, bladder incontinence, bowel incontinence, chest pain, dysuria, headaches, paresthesias, pelvic pain or weakness. She has tried analgesics for the symptoms. The treatment provided mild relief.      Review of Systems  Constitutional:  Negative for diaphoresis.  Eyes:  Negative for pain.  Respiratory:  Negative for shortness of breath.   Cardiovascular:  Negative for chest pain, palpitations and leg swelling.  Gastrointestinal:  Negative for abdominal pain and bowel incontinence.  Endocrine: Negative for polydipsia.  Genitourinary:  Negative for bladder incontinence, dysuria and pelvic pain.  Musculoskeletal:  Positive for back pain.  Skin:  Negative for rash.  Neurological:  Negative for dizziness, weakness, headaches and paresthesias.  Hematological:  Does not bruise/bleed easily.  All other systems reviewed and are negative.     Objective:   Physical Exam Vitals reviewed.  Constitutional:      Appearance: Normal appearance.  Cardiovascular:     Rate and Rhythm: Regular rhythm.     Heart sounds: Normal heart sounds.  Pulmonary:     Breath sounds: Normal breath sounds.  Musculoskeletal:     Comments: Decrease RIOM of lumbar spine with pain on flexion, extension and rotation (-) SLR Motor strength and sensation distally intact  Neurological:     General: No focal deficit present.     Mental  Status: She is alert and oriented to person, place, and time.     Cranial Nerves: No cranial nerve deficit.     Sensory: No sensory deficit.  Psychiatric:        Mood and Affect: Mood normal.        Behavior: Behavior normal.   BP (!) 175/88   Pulse 72   Temp (!) 97.4 F (36.3 C) (Temporal)   Resp 20   Ht 5\' 2"  (1.575 m)   Wt 164 lb (74.4 kg)   SpO2 98%   BMI 30.00 kg/m         Assessment & Plan:  Brooke Newton in today with chief complaint of Back Pain (Pain radiating down left leg/)   1. Acute left-sided low back pain with left-sided sciatica Moist heat Rest RTO if no better in 2 weeks - ketorolac (TORADOL) injection 60 mg - methylPREDNISolone acetate (DEPO-MEDROL) injection 80 mg - predniSONE (DELTASONE) 20 MG tablet; Take 2 tablets (40 mg total) by mouth daily with breakfast for 5 days. 2 po daily for 5 days  Dispense: 10 tablet; Refill: 0 - cyclobenzaprine (FLEXERIL) 10 MG tablet; Take 1 tablet (10 mg total) by mouth 3 (three) times daily as needed for muscle spasms.  Dispense: 30 tablet; Refill: 1    The above assessment and management plan was discussed with the patient. The patient verbalized understanding of and has agreed to the management plan. Patient is aware to call the clinic if symptoms persist or worsen. Patient is aware when to return to  the clinic for a follow-up visit. Patient educated on when it is appropriate to go to the emergency department.   Mary-Margaret Daphine Deutscher, FNP

## 2022-05-15 ENCOUNTER — Ambulatory Visit: Payer: BC Managed Care – PPO | Admitting: Nurse Practitioner

## 2022-05-19 ENCOUNTER — Encounter: Payer: Self-pay | Admitting: Nurse Practitioner

## 2022-05-19 ENCOUNTER — Ambulatory Visit: Payer: BC Managed Care – PPO | Admitting: Nurse Practitioner

## 2022-05-19 VITALS — BP 177/88 | HR 73 | Temp 97.6°F | Resp 20 | Ht 62.0 in | Wt 165.0 lb

## 2022-05-19 DIAGNOSIS — M5442 Lumbago with sciatica, left side: Secondary | ICD-10-CM

## 2022-05-19 MED ORDER — METHYLPREDNISOLONE ACETATE 80 MG/ML IJ SUSP
80.0000 mg | Freq: Once | INTRAMUSCULAR | Status: AC
  Administered 2022-05-19: 80 mg via INTRAMUSCULAR

## 2022-05-19 MED ORDER — PREDNISONE 10 MG (21) PO TBPK: ORAL_TABLET | ORAL | 0 refills | Status: DC

## 2022-05-19 NOTE — Progress Notes (Signed)
   Subjective:    Patient ID: Brooke Newton, female    DOB: 08-Dec-1961, 61 y.o.   MRN: [id number]   Chief Complaint: back pain  Back Pain This is a chronic problem. The current episode started 1 to 4 weeks ago. The problem has been gradually worsening since onset. The pain is present in the gluteal. The quality of the pain is described as burning. The pain radiates to the left foot. The pain is at a severity of 7/10. The pain is moderate. The pain is Worse during the day. The symptoms are aggravated by lying down. Associated symptoms include numbness (left foot) and tingling. Pertinent negatives include no bladder incontinence, bowel incontinence or weakness. She has tried analgesics for the symptoms. The treatment provided mild relief.   Was seen last week with same complaint and was give steroid shot, toradol ,and low dose prednisone for 5 days. Slightly better.   Review of Systems  Gastrointestinal:  Negative for bowel incontinence.  Genitourinary:  Negative for bladder incontinence.  Musculoskeletal:  Positive for back pain.  Neurological:  Positive for tingling and numbness (left foot). Negative for weakness.      Objective:   Physical Exam Constitutional:      Appearance: Normal appearance.  Cardiovascular:     Rate and Rhythm: Normal rate and regular rhythm.     Heart sounds: Normal heart sounds.  Pulmonary:     Effort: Pulmonary effort is normal.     Breath sounds: Normal breath sounds.  Musculoskeletal:     Comments: FROM of lumbar spine with pain on flexion and rotation. Mildly  (+)  SLR on left Motor strength and sensation of lower ext intact   Skin:    General: Skin is warm.  Neurological:     General: No focal deficit present.     Mental Status: She is alert and oriented to person, place, and time.     Cranial Nerves: No cranial nerve deficit.     Sensory: No sensory deficit.  Psychiatric:        Mood and Affect: Mood normal.        Behavior: Behavior normal.    BP (!) 177/88   Pulse 73   Temp 97.6 F (36.4 C) (Temporal)   Resp 20   Ht 5\' 2"  (1.575 m)   Wt 165 lb (74.8 kg)   SpO2 98%   BMI 30.18 kg/m         Assessment & Plan:   Brooke Newton in today with chief complaint of Back Pain (Radiating down left leg)   1. Acute left-sided low back pain with left-sided sciatica Moist heat Rest Low back stretches - methylPREDNISolone acetate (DEPO-MEDROL) injection 80 mg - predniSONE (STERAPRED UNI-PAK 21 TAB) 10 MG (21) TBPK tablet; As directed x 6 days  Dispense: 21 tablet; Refill: 0    The above assessment and management plan was discussed with the patient. The patient verbalized understanding of and has agreed to the management plan. Patient is aware to call the clinic if symptoms persist or worsen. Patient is aware when to return to the clinic for a follow-up visit. Patient educated on when it is appropriate to go to the emergency department.   Mary-Margaret Carola Rhine, FNP

## 2022-05-19 NOTE — Patient Instructions (Signed)
Sciatica  Sciatica is pain, numbness, weakness, or tingling along the path of the sciatic nerve. The sciatic nerve starts in the lower back and runs down the back of each leg. The nerve controls the muscles in the lower leg and in the back of the knee. It also provides feeling (sensation) to the back of the thigh, the lower leg, and the sole of the foot. Sciatica is a symptom of another medical condition that pinches or puts pressure on the sciatic nerve. Sciatica most often only affects one side of the body. Sciatica usually goes away on its own or with treatment. In some cases, sciatica may come back (recur). What are the causes? This condition is caused by pressure on the sciatic nerve or pinching of the nerve. This may be the result of: A disk in between the bones of the spine bulging out too far (herniated disk). Age-related changes in the spinal disks. A pain disorder that affects a muscle in the buttock. Extra bone growth near the sciatic nerve. A break (fracture) of the pelvis. Pregnancy. Tumor. This is rare. What increases the risk? The following factors may make you more likely to develop this condition: Playing sports that place pressure or stress on the spine. Having poor strength and flexibility. A history of back injury or surgery. Sitting for long periods of time. Doing activities that involve repetitive bending or lifting. Obesity. What are the signs or symptoms? Symptoms can vary from mild to very severe, and they may include: Any of these problems in the lower back, leg, hip, or buttock: Mild tingling, numbness, or dull aches. Burning sensations. Sharp pains. Numbness in the back of the calf or the sole of the foot. Leg weakness. Severe back pain that makes movement difficult. Symptoms may get worse when you cough, sneeze, or laugh, or when you sit or stand for long periods of time. How is this diagnosed? This condition may be diagnosed based on: Your symptoms and  medical history. A physical exam. Blood tests. Imaging tests, such as: X-rays. MRI. CT scan. How is this treated? In many cases, this condition improves on its own without treatment. However, treatment may include: Reducing or modifying physical activity. Exercising and stretching. Icing and applying heat to the affected area. Medicines that help to: Relieve pain and swelling. Relax your muscles. Injections of medicines that help to relieve pain, irritation, and inflammation around the sciatic nerve (steroids). Surgery. Follow these instructions at home: Medicines Take over-the-counter and prescription medicines only as told by your health care provider. Ask your health care provider if the medicine prescribed to you: Requires you to avoid driving or using heavy machinery. Can cause constipation. You may need to take these actions to prevent or treat constipation: Drink enough fluid to keep your urine pale yellow. Take over-the-counter or prescription medicines. Eat foods that are high in fiber, such as beans, whole grains, and fresh fruits and vegetables. Limit foods that are high in fat and processed sugars, such as fried or sweet foods. Managing pain     If directed, put ice on the affected area. Put ice in a plastic bag. Place a towel between your skin and the bag. Leave the ice on for 20 minutes, 2-3 times a day. If directed, apply heat to the affected area. Use the heat source that your health care provider recommends, such as a moist heat pack or a heating pad. Place a towel between your skin and the heat source. Leave the heat on for   20-30 minutes. Remove the heat if your skin turns bright red. This is especially important if you are unable to feel pain, heat, or cold. You may have a greater risk of getting burned. Activity  Return to your normal activities as told by your health care provider. Ask your health care provider what activities are safe for you. Avoid  activities that make your symptoms worse. Take brief periods of rest throughout the day. When you rest for longer periods, mix in some mild activity or stretching between periods of rest. This will help to prevent stiffness and pain. Avoid sitting for long periods of time without moving. Get up and move around at least one time each hour. Exercise and stretch regularly, as told by your health care provider. Do not lift anything that is heavier than 10 lb (4.5 kg) while you have symptoms of sciatica. When you do not have symptoms, you should still avoid heavy lifting, especially repetitive heavy lifting. When you lift objects, always use proper lifting technique, which includes: Bending your knees. Keeping the load close to your body. Avoiding twisting. General instructions Maintain a healthy weight. Excess weight puts extra stress on your back. Wear supportive, comfortable shoes. Avoid wearing high heels. Avoid sleeping on a mattress that is too soft or too hard. A mattress that is firm enough to support your back when you sleep may help to reduce your pain. Keep all follow-up visits as told by your health care provider. This is important. Contact a health care provider if: You have pain that: Wakes you up when you are sleeping. Gets worse when you lie down. Is worse than you have experienced in the past. Lasts longer than 4 weeks. You have an unexplained weight loss. Get help right away if: You are not able to control when you urinate or have bowel movements (incontinence). You have: Weakness in your lower back, pelvis, buttocks, or legs that gets worse. Redness or swelling of your back. A burning sensation when you urinate. Summary Sciatica is pain, numbness, weakness, or tingling along the path of the sciatic nerve. This condition is caused by pressure on the sciatic nerve or pinching of the nerve. Sciatica can cause pain, numbness, or tingling in the lower back, legs, hips, and  buttocks. Treatment often includes rest, exercise, medicines, and applying ice or heat. This information is not intended to replace advice given to you by your health care provider. Make sure you discuss any questions you have with your health care provider. Document Revised: 12/26/2018 Document Reviewed: 12/26/2018 Elsevier Patient Education  2023 [employer] Inc[employer]  

## 2022-05-28 ENCOUNTER — Ambulatory Visit: Payer: BC Managed Care – PPO | Admitting: Nurse Practitioner

## 2022-05-28 ENCOUNTER — Encounter: Payer: Self-pay | Admitting: Nurse Practitioner

## 2022-05-28 VITALS — BP 166/92 | HR 67 | Temp 97.4°F | Resp 20 | Ht 62.0 in | Wt 163.0 lb

## 2022-05-28 DIAGNOSIS — M5442 Lumbago with sciatica, left side: Secondary | ICD-10-CM

## 2022-05-28 MED ORDER — DICLOFENAC SODIUM 75 MG PO TBEC: 75.0000 mg | DELAYED_RELEASE_TABLET | Freq: Two times a day (BID) | ORAL | 1 refills | Status: DC

## 2022-05-28 NOTE — Patient Instructions (Signed)

## 2022-05-28 NOTE — Progress Notes (Signed)
   Subjective:    Patient ID: Brooke Newton, female    DOB: [birth year], 61 y.o.   MRN: [id number]   Chief Complaint: Back Pain   Back Pain Pertinent negatives include no abdominal pain, chest pain, headaches or weakness.   Patient come sin still c/o back pain. Pain is in lower back and radiates back of left leg. Started at the end of APril. Has been treated with steroids and muscle relaxer. No relief. Currently rates pain 8/10. Night laying down increases pain. Changing positions sometimes help. Still denying any injury    Review of Systems  Constitutional:  Negative for diaphoresis.  Eyes:  Negative for pain.  Respiratory:  Negative for shortness of breath.   Cardiovascular:  Negative for chest pain, palpitations and leg swelling.  Gastrointestinal:  Negative for abdominal pain.  Endocrine: Negative for polydipsia.  Musculoskeletal:  Positive for back pain.  Skin:  Negative for rash.  Neurological:  Negative for dizziness, weakness and headaches.  Hematological:  Does not bruise/bleed easily.  All other systems reviewed and are negative.      Objective:   Physical Exam Vitals reviewed.  Constitutional:      Appearance: Normal appearance.  Cardiovascular:     Rate and Rhythm: Normal rate and regular rhythm.     Heart sounds: Normal heart sounds.  Pulmonary:     Effort: Pulmonary effort is normal.     Breath sounds: Normal breath sounds.  Musculoskeletal:     Comments: Pain on flexion of lumbar spine (+) SLR on left Motor strength and sensation distally intact.  Skin:    General: Skin is warm.  Neurological:     General: No focal deficit present.     Mental Status: She is alert and oriented to person, place, and time.     Cranial Nerves: No cranial nerve deficit.     Sensory: No sensory deficit.     Deep Tendon Reflexes: Reflexes normal.  Psychiatric:        Mood and Affect: Mood normal.        Behavior: Behavior normal.    BP (!) 166/92   Pulse 67   Temp  (!) 97.4 F (36.3 C) (Temporal)   Resp 20   Ht 5\' 2"  (1.575 m)   Wt 163 lb (73.9 kg)   SpO2 98%   BMI 29.81 kg/m         Assessment & Plan:  [place] in today with chief complaint of Back Pain   1. Acute left-sided low back pain with left-sided sciatica Moist heat Rest RTO prn - MR Lumbar Spine Wo Contrast; Future - diclofenac (VOLTAREN) 75 MG EC tablet; Take 1 tablet (75 mg total) by mouth 2 (two) times daily.  Dispense: 60 tablet; Refill: 1    The above assessment and management plan was discussed with the patient. The patient verbalized understanding of and has agreed to the management plan. Patient is aware to call the clinic if symptoms persist or worsen. Patient is aware when to return to the clinic for a follow-up visit. Patient educated on when it is appropriate to go to the emergency department.   Mary-Margaret Carola Rhine, FNP

## 2022-06-03 ENCOUNTER — Other Ambulatory Visit: Payer: Self-pay

## 2022-06-03 DIAGNOSIS — M5442 Lumbago with sciatica, left side: Secondary | ICD-10-CM

## 2022-06-15 ENCOUNTER — Ambulatory Visit: Payer: BC Managed Care – PPO | Attending: Nurse Practitioner | Admitting: Physical Therapy

## 2022-06-15 ENCOUNTER — Encounter: Payer: Self-pay | Admitting: Physical Therapy

## 2022-06-15 DIAGNOSIS — M5459 Other low back pain: Secondary | ICD-10-CM | POA: Diagnosis not present

## 2022-06-15 DIAGNOSIS — M5442 Lumbago with sciatica, left side: Secondary | ICD-10-CM | POA: Diagnosis not present

## 2022-07-14 ENCOUNTER — Encounter: Payer: Self-pay | Admitting: Nurse Practitioner

## 2022-07-14 ENCOUNTER — Ambulatory Visit: Payer: BC Managed Care – PPO | Admitting: Nurse Practitioner

## 2022-07-14 VITALS — BP 139/81 | HR 67 | Temp 98.2°F | Ht 62.0 in | Wt 163.0 lb

## 2022-07-14 DIAGNOSIS — R300 Dysuria: Secondary | ICD-10-CM

## 2022-07-14 LAB — MICROSCOPIC EXAMINATION
Bacteria, UA: NONE SEEN
Epithelial Cells (non renal): NONE SEEN /hpf (ref 0–10)
RBC, Urine: NONE SEEN /hpf (ref 0–2)
Renal Epithel, UA: NONE SEEN /hpf
WBC, UA: NONE SEEN /hpf (ref 0–5)

## 2022-07-14 LAB — URINALYSIS, COMPLETE
Bilirubin, UA: NEGATIVE
Glucose, UA: NEGATIVE
Ketones, UA: NEGATIVE
Leukocytes,UA: NEGATIVE
Nitrite, UA: NEGATIVE
Protein,UA: NEGATIVE
RBC, UA: NEGATIVE
Specific Gravity, UA: 1.01 (ref 1.005–1.030)
Urobilinogen, Ur: 1 mg/dL (ref 0.2–1.0)
pH, UA: 7 (ref 5.0–7.5)

## 2022-07-14 NOTE — Patient Instructions (Addendum)
Dysuria Dysuria is pain or discomfort during urination. The pain or discomfort may be felt in the part of the body that drains urine from the bladder (urethra) or in the surrounding tissue of the genitals. The pain may also be felt in the groin area, lower abdomen, or lower back. You may have to urinate frequently or have the sudden feeling that you have to urinate (urgency). Dysuria can affect anyone, but it is more common in females. Dysuria can be caused by many different things, including: Urinary tract infection. Kidney stones or bladder stones. Certain STIs (sexually transmitted infections), such as chlamydia. Dehydration. Inflammation of the tissues of the vagina. Use of certain medicines. Use of certain soaps or scented products that cause irritation. Follow these instructions at home: Medicines Take over-the-counter and prescription medicines only as told by your health care provider. If you were prescribed an antibiotic medicine, take it as told by your health care provider. Do not stop taking the antibiotic even if you start to feel better. Eating and drinking  Drink enough fluid to keep your urine pale yellow. Avoid caffeinated beverages, tea, and alcohol. These beverages can irritate the bladder and make dysuria worse. In males, alcohol may irritate the prostate. General instructions Watch your condition for any changes. Urinate often. Avoid holding urine for long periods of time. If you are female, you should wipe from front to back after urinating or having a bowel movement. Use each piece of toilet paper only once. Empty your bladder after sex. Keep all follow-up visits. This is important. If you had any tests done to find the cause of dysuria, it is up to you to get your test results. Ask your health care provider, or the department that is doing the test, when your results will be ready. Contact a health care provider if: You have a fever. You develop pain in your back or  sides. You have nausea or vomiting. You have blood in your urine. You are not urinating as often as you usually do. Get help right away if: Your pain is severe and not relieved with medicines. You cannot eat or drink without vomiting. You are confused. You have a rapid heartbeat while resting. You have shaking or chills. You feel extremely weak. Summary Dysuria is pain or discomfort while urinating. Many different conditions can lead to dysuria. If you have dysuria, you may have to urinate frequently or have the sudden feeling that you have to urinate (urgency). Watch your condition for any changes. Keep all follow-up visits. Make sure that you urinate often and drink enough fluid to keep your urine pale yellow. This information is not intended to replace advice given to you by your health care provider. Make sure you discuss any questions you have with your health care provider. Document Revised: 07/19/2020 Document Reviewed: 07/19/2020 Elsevier Patient Education  2023 Else[employer] Inc[employer] Fever, Adult     A fever is an increase in your body's temperature. It often means a temperature of 100.39F (38C) or higher. Brief mild or moderate fevers often have no long-term effects. They often do not need treatment. Moderate or high fevers may make you feel uncomfortable. Sometimes, they can be a sign of a serious illness or disease. A fever that keeps coming back or that lasts a long time may cause you to lose water in your body (get dehydrated). You can take your temperature with a thermometer to see if you have a fever. Temperature can change with: Age. Time of day. Where  the thermometer is put in the body. Readings may vary when the thermometer is put: In the mouth (oral). In the butt (rectal). In the ear (tympanic). Under the arm (axillary). On the forehead (temporal). Follow these instructions at home: Medicines Take over-the-counter and prescription medicines only as told by your doctor.  Follow the dosing instructions carefully. If you were prescribed an antibiotic medicine, take it as told by your doctor. Do not stop taking it even if you start to feel better. General instructions Watch for any changes in your symptoms. Tell your doctor about them. Rest as needed. Drink enough fluid to keep your pee (urine) pale yellow. Sponge yourself or bathe with room-temperature water as needed. This helps to lower your body temperature. Do not use ice water. Do not use too many blankets or wear clothes that are too heavy. If your fever was caused by an infection that spreads from person to person (is contagious), such as a cold or the flu: You should stay home from work and public places for at least 24 hours after your fever is gone. Your fever should be gone for at least 24 hours without the need to use medicines. Contact a doctor if: You throw up (vomit). You cannot eat or drink without throwing up. You have watery poop (diarrhea). It hurts when you pee. Your symptoms do not get better with treatment. You have new symptoms. You feel very weak. Get help right away if: You are short of breath or have trouble breathing. You are dizzy or you pass out (faint). You feel mixed up (confused). You have signs of not having enough water in your body, such as: Dark pee, very little pee, or no pee. Cracked lips. Dry mouth. Sunken eyes. Sleepiness. Weakness. You have very bad pain in your belly (abdomen). You keep throwing up or having watery poop. You have a rash on your skin. Your symptoms get worse all of a sudden. Summary A fever is an increase in your body's temperature. It often means a temperature of 100.80F (38C) or higher. Watch for any changes in your symptoms. Tell your doctor about them. Take all medicines only as told by your doctor. Do not go to work or other public places if your fever was caused by an illness that can spread to other people. Get help right away if  you have signs that you do not have enough water in your body. This information is not intended to replace advice given to you by your health care provider. Make sure you discuss any questions you have with your health care provider. Document Revised: 04/29/2021 Document Reviewed: 04/29/2021 Elsevier Patient Education  2023 [employer].

## 2022-07-14 NOTE — Progress Notes (Signed)
Acute Office Visit  Subjective:     Patient ID: Brooke Newton, female    DOB: 1961/07/06, 61 y.o.   MRN: [id number]  Chief Complaint  Patient presents with   Urinary Tract Infection   Fever    2 days    Urinary Tract Infection  This is a new problem. The current episode started yesterday. The problem occurs intermittently. The problem has been gradually improving. The patient is experiencing no pain. The maximum temperature recorded prior to her arrival was 100 - 100.9 F. The fever has been present for 1 - 2 days. Associated symptoms include chills. Pertinent negatives include no flank pain, frequency, nausea or urgency. She has tried acetaminophen for the symptoms. The treatment provided significant relief.  Fever  This is a new problem. The current episode started yesterday. The problem occurs constantly. The problem has been gradually improving. The maximum temperature noted was 100 to 100.9 F. Pertinent negatives include no abdominal pain, chest pain, congestion, coughing, ear pain, headaches, muscle aches or nausea. She has tried nothing for the symptoms.     Review of Systems  Constitutional:  Positive for chills and fever.  HENT:  Negative for congestion and ear pain.   Respiratory:  Negative for cough.   Cardiovascular:  Negative for chest pain.  Gastrointestinal:  Negative for abdominal pain and nausea.  Genitourinary:  Negative for flank pain, frequency and urgency.  Neurological:  Negative for headaches.  All other systems reviewed and are negative.       Objective:    BP 139/81   Pulse 67   Temp 98.2 F (36.8 C)   Ht 5\' 2"  (1.575 m)   Wt 163 lb (73.9 kg)   SpO2 94%   BMI 29.81 kg/m  BP Readings from Last 3 Encounters:  07/14/22 139/81  05/28/22 (!) 166/92  05/19/22 (!) 177/88   Wt Readings from Last 3 Encounters:  07/14/22 163 lb (73.9 kg)  05/28/22 163 lb (73.9 kg)  05/19/22 165 lb (74.8 kg)      Physical Exam Vitals and nursing note reviewed.   Constitutional:      Appearance: Normal appearance.  HENT:     Head: Normocephalic and atraumatic.     Right Ear: External ear normal.     Left Ear: External ear normal.     Nose: Nose normal. No congestion.     Mouth/Throat:     Mouth: Mucous membranes are moist.     Pharynx: Oropharynx is clear.  Eyes:     Conjunctiva/sclera: Conjunctivae normal.  Cardiovascular:     Rate and Rhythm: Normal rate and regular rhythm.     Pulses: Normal pulses.     Heart sounds: Normal heart sounds.  Pulmonary:     Effort: Pulmonary effort is normal.     Breath sounds: Normal breath sounds.  Abdominal:     General: Bowel sounds are normal.  Musculoskeletal:        General: Normal range of motion.  Skin:    General: Skin is warm.     Findings: No erythema or rash.  Neurological:     General: No focal deficit present.     Mental Status: She is alert and oriented to person, place, and time.  Psychiatric:        Behavior: Behavior normal.     Results for orders placed or performed in visit on 07/14/22  Microscopic Examination   Urine  Result Value Ref Range   WBC, UA None seen  0 - 5 /hpf   RBC, Urine None seen 0 - 2 /hpf   Epithelial Cells (non renal) None seen 0 - 10 /hpf   Renal Epithel, UA None seen None seen /hpf   Bacteria, UA None seen None seen/Few  Urinalysis, Complete  Result Value Ref Range   Specific Gravity, UA 1.010 1.005 - 1.030   pH, UA 7.0 5.0 - 7.5   Color, UA Yellow Yellow   Appearance Ur Clear Clear   Leukocytes,UA Negative Negative   Protein,UA Negative Negative/Trace   Glucose, UA Negative Negative   Ketones, UA Negative Negative   RBC, UA Negative Negative   Bilirubin, UA Negative Negative   Urobilinogen, Ur 1.0 0.2 - 1.0 mg/dL   Nitrite, UA Negative Negative   Microscopic Examination See below:         Assessment & Plan:  Current symptoms of UTI.  Patient reports that she developed a fever and wanted to make sure that she did not have a  UTI.  -urinalysis completed results shows negative for leukocytes, nitrites, bacteria and all other markers. -pyridium for pain as needed Precaution and education provided -All questions answered -follow up with unresolved symptoms  Problem List Items Addressed This Visit   None Visit Diagnoses     Dysuria    -  Primary   Relevant Orders   Urine Culture   Urinalysis, Complete (Completed)       No orders of the defined types were placed in this encounter.   Return if symptoms worsen or fail to improve.  Daryll Drown, NP

## 2022-07-16 LAB — URINE CULTURE

## 2022-08-18 DIAGNOSIS — Z1231 Encounter for screening mammogram for malignant neoplasm of breast: Secondary | ICD-10-CM | POA: Diagnosis not present

## 2022-08-31 ENCOUNTER — Encounter: Payer: Self-pay | Admitting: Nurse Practitioner

## 2022-08-31 ENCOUNTER — Ambulatory Visit: Payer: BC Managed Care – PPO | Admitting: Nurse Practitioner

## 2022-08-31 VITALS — BP 132/81 | HR 82 | Temp 98.9°F | Resp 20 | Ht 62.0 in | Wt 162.0 lb

## 2022-08-31 DIAGNOSIS — I10 Essential (primary) hypertension: Secondary | ICD-10-CM | POA: Diagnosis not present

## 2022-08-31 DIAGNOSIS — E785 Hyperlipidemia, unspecified: Secondary | ICD-10-CM

## 2022-08-31 DIAGNOSIS — E039 Hypothyroidism, unspecified: Secondary | ICD-10-CM | POA: Diagnosis not present

## 2022-08-31 DIAGNOSIS — I6930 Unspecified sequelae of cerebral infarction: Secondary | ICD-10-CM

## 2022-08-31 DIAGNOSIS — Z6830 Body mass index (BMI) 30.0-30.9, adult: Secondary | ICD-10-CM

## 2022-08-31 MED ORDER — LOSARTAN POTASSIUM 100 MG PO TABS: 100.0000 mg | ORAL_TABLET | Freq: Every day | ORAL | 1 refills | Status: DC

## 2022-08-31 MED ORDER — LEVOTHYROXINE SODIUM 88 MCG PO TABS: 88.0000 ug | ORAL_TABLET | Freq: Every day | ORAL | 1 refills | Status: DC

## 2022-08-31 MED ORDER — AMLODIPINE BESYLATE 10 MG PO TABS: 10.0000 mg | ORAL_TABLET | Freq: Every day | ORAL | 1 refills | Status: DC

## 2022-08-31 NOTE — Patient Instructions (Signed)

## 2022-08-31 NOTE — Progress Notes (Signed)
Subjective:    Patient ID: Brooke Newton, female    DOB: 1961/07/18, 61 y.o.   MRN: [id number]   Chief Complaint: medical management of chronic issues     HPI:  Brooke Newton is a 61 y.o. who identifies as a female who was assigned female at birth.   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 Essential hypertension, benign No c/o chest pain, sob or headache. Does not check blood pressure at home. BP Readings from Last 3 Encounters:  07/14/22 139/81  05/28/22 (!) 166/92  05/19/22 (!) 177/88    2. Hyperlipidemia with target LDL less than 100 Does watch diet and stays very active. Lab Results  Component Value Date   CHOL 216 (H) 02/24/2022   HDL 58 02/24/2022   LDLCALC 147 (H) 02/24/2022   TRIG 65 02/24/2022   CHOLHDL 3.7 02/24/2022   The 10-year ASCVD risk score (Arnett DK, et al., 2019) is: 7.2%   3. Late effect of cerebrovascular accident No permanent effects  4. Acquired hypothyroidism No problems that she is aware of Lab Results  Component Value Date   TSH 2.390 07/23/2020     5. BMI 30.0-30.9,adult No recent weight changes Wt Readings from Last 3 Encounters:  08/31/22 162 lb (73.5 kg)  07/14/22 163 lb (73.9 kg)  05/28/22 163 lb (73.9 kg)   BMI Readings from Last 3 Encounters:  08/31/22 29.63 kg/m  07/14/22 29.81 kg/m  05/28/22 29.81 kg/m     New complaints: None today  No Known Allergies Outpatient Encounter Medications as of 08/31/2022  Medication Sig   amLODipine (NORVASC) 10 MG tablet Take 1 tablet (10 mg total) by mouth daily.   aspirin 325 MG tablet Take 325 mg by mouth daily.   diclofenac (VOLTAREN) 75 MG EC tablet Take 1 tablet (75 mg total) by mouth 2 (two) times daily.   levothyroxine (SYNTHROID) 88 MCG tablet Take 1 tablet (88 mcg total) by mouth daily.   losartan (COZAAR) 100 MG tablet Take 1 tablet (100 mg total) by mouth  daily.   rosuvastatin (CRESTOR) 10 MG tablet Take 1 tablet (10 mg total) by mouth daily.   No facility-administered encounter medications on file as of 08/31/2022.    Past Surgical History:  Procedure Laterality Date   CESAREAN SECTION      Family History  Problem Relation Age of Onset   Hypertension Mother    Stroke Mother    Hypertension Brother       Controlled substance contract: n/a     Review of Systems  Constitutional:  Negative for diaphoresis.  Eyes:  Negative for pain.  Respiratory:  Negative for shortness of breath.   Cardiovascular:  Negative for chest pain, palpitations and leg swelling.  Gastrointestinal:  Negative for abdominal pain.  Endocrine: Negative for polydipsia.  Skin:  Negative for rash.  Neurological:  Negative for dizziness, weakness and headaches.  Hematological:  Does not bruise/bleed easily.  All other systems reviewed and are negative.      Objective:   Physical Exam Vitals and nursing note reviewed.  Constitutional:      General: She is not in acute distress.    Appearance: Normal appearance. She is well-developed.  HENT:     Head: Normocephalic.     Right Ear: Tympanic membrane normal.     Left Ear: Tympanic membrane normal.     Nose: Nose  normal.     Mouth/Throat:     Mouth: Mucous membranes are moist.  Eyes:     Pupils: Pupils are equal, round, and reactive to light.  Neck:     Vascular: No carotid bruit or JVD.  Cardiovascular:     Rate and Rhythm: Normal rate and regular rhythm.     Heart sounds: Normal heart sounds.  Pulmonary:     Effort: Pulmonary effort is normal. No respiratory distress.     Breath sounds: Normal breath sounds. No wheezing or rales.  Chest:     Chest wall: No tenderness.  Abdominal:     General: Bowel sounds are normal. There is no distension or abdominal bruit.     Palpations: Abdomen is soft. There is no hepatomegaly, splenomegaly, mass or pulsatile mass.     Tenderness: There is no abdominal  tenderness.  Musculoskeletal:        General: Normal range of motion.     Cervical back: Normal range of motion and neck supple.  Lymphadenopathy:     Cervical: No cervical adenopathy.  Skin:    General: Skin is warm and dry.  Neurological:     Mental Status: She is alert and oriented to person, place, and time.     Deep Tendon Reflexes: Reflexes are normal and symmetric.  Psychiatric:        Behavior: Behavior normal.        Thought Content: Thought content normal.        Judgment: Judgment normal.     BP 132/81   Pulse 82   Temp 98.9 F (37.2 C) (Temporal)   Resp 20   Ht '5\' 2"'  (1.575 m)   Wt 162 lb (73.5 kg)   SpO2 95%   BMI 29.63 kg/m         Assessment & Plan:   Brooke Newton comes in today with chief complaint of Medical Management of Chronic Issues   Diagnosis and orders addressed:  1. Essential hypertension, benign Low sodium diet - losartan (COZAAR) 100 MG tablet; Take 1 tablet (100 mg total) by mouth daily.  Dispense: 90 tablet; Refill: 1 - amLODipine (NORVASC) 10 MG tablet; Take 1 tablet (10 mg total) by mouth daily.  Dispense: 90 tablet; Refill: 1  2. Hyperlipidemia with target LDL less than 100 Low fat diet  3. Late effect of cerebrovascular accident  4. Acquired hypothyroidism Labs pending - levothyroxine (SYNTHROID) 88 MCG tablet; Take 1 tablet (88 mcg total) by mouth daily.  Dispense: 90 tablet; Refill: 1  5. BMI 30.0-30.9,adult Discussed diet and exercise for person with BMI >25 Will recheck weight in 3-6 months  Orders Placed This Encounter  Procedures   CBC with Differential/Platelet   CMP14+EGFR   Lipid panel   Thyroid Panel With TSH     Labs pending Health Maintenance reviewed Diet and exercise encouraged  Follow up plan: 6 months   [place], FNP

## 2022-09-01 LAB — CMP14+EGFR
ALT: 23 IU/L (ref 0–32)
AST: 22 IU/L (ref 0–40)
Albumin/Globulin Ratio: 1.8 (ref 1.2–2.2)
Albumin: 4.4 g/dL (ref 3.9–4.9)
Alkaline Phosphatase: 104 IU/L (ref 44–121)
BUN/Creatinine Ratio: 10 — ABNORMAL LOW (ref 12–28)
BUN: 7 mg/dL — ABNORMAL LOW (ref 8–27)
Bilirubin Total: 0.5 mg/dL (ref 0.0–1.2)
CO2: 22 mmol/L (ref 20–29)
Calcium: 10.1 mg/dL (ref 8.7–10.3)
Chloride: 102 mmol/L (ref 96–106)
Creatinine, Ser: 0.71 mg/dL (ref 0.57–1.00)
Globulin, Total: 2.4 g/dL (ref 1.5–4.5)
Glucose: 99 mg/dL (ref 70–99)
Potassium: 4.8 mmol/L (ref 3.5–5.2)
Sodium: 142 mmol/L (ref 134–144)
Total Protein: 6.8 g/dL (ref 6.0–8.5)
eGFR: 95 mL/min/{1.73_m2} (ref >59)

## 2022-09-01 LAB — CBC WITH DIFFERENTIAL/PLATELET
Basophils Absolute: 0 10*3/uL (ref 0.0–0.2)
Basos: 1 %
EOS (ABSOLUTE): 0.1 10*3/uL (ref 0.0–0.4)
Eos: 2 %
Hematocrit: 43.5 % (ref 34.0–46.6)
Hemoglobin: 14.1 g/dL (ref 11.1–15.9)
Immature Grans (Abs): 0 10*3/uL (ref 0.0–0.1)
Immature Granulocytes: 0 %
Lymphocytes Absolute: 1.5 10*3/uL (ref 0.7–3.1)
Lymphs: 19 %
MCH: 30.5 pg (ref 26.6–33.0)
MCHC: 32.4 g/dL (ref 31.5–35.7)
MCV: 94 fL (ref 79–97)
Monocytes Absolute: 0.5 10*3/uL (ref 0.1–0.9)
Monocytes: 6 %
Neutrophils Absolute: 5.9 10*3/uL (ref 1.4–7.0)
Neutrophils: 72 %
Platelets: 333 10*3/uL (ref 150–450)
RBC: 4.62 x10E6/uL (ref 3.77–5.28)
RDW: 13.5 % (ref 11.7–15.4)
WBC: 8.1 10*3/uL (ref 3.4–10.8)

## 2022-09-01 LAB — LIPID PANEL
Chol/HDL Ratio: 4 ratio (ref 0.0–4.4)
Cholesterol, Total: 214 mg/dL — ABNORMAL HIGH (ref 100–199)
HDL: 54 mg/dL (ref >39)
LDL Chol Calc (NIH): 142 mg/dL — ABNORMAL HIGH (ref 0–99)
Triglycerides: 102 mg/dL (ref 0–149)
VLDL Cholesterol Cal: 18 mg/dL (ref 5–40)

## 2022-09-01 LAB — THYROID PANEL WITH TSH
Free Thyroxine Index: 2.9 (ref 1.2–4.9)
T3 Uptake Ratio: 27 % (ref 24–39)
T4, Total: 10.7 ug/dL (ref 4.5–12.0)
TSH: 4.09 u[IU]/mL (ref 0.450–4.500)

## 2022-09-11 ENCOUNTER — Other Ambulatory Visit: Payer: Self-pay | Admitting: Nurse Practitioner

## 2022-09-11 DIAGNOSIS — E785 Hyperlipidemia, unspecified: Secondary | ICD-10-CM

## 2023-03-01 ENCOUNTER — Ambulatory Visit (INDEPENDENT_AMBULATORY_CARE_PROVIDER_SITE_OTHER): Payer: BC Managed Care – PPO

## 2023-03-01 ENCOUNTER — Encounter: Payer: Self-pay | Admitting: Nurse Practitioner

## 2023-03-01 ENCOUNTER — Ambulatory Visit: Payer: BC Managed Care – PPO | Admitting: Nurse Practitioner

## 2023-03-01 VITALS — BP 161/98 | HR 87 | Temp 97.8°F | Resp 20 | Ht 62.0 in | Wt 167.0 lb

## 2023-03-01 DIAGNOSIS — I6930 Unspecified sequelae of cerebral infarction: Secondary | ICD-10-CM

## 2023-03-01 DIAGNOSIS — Z0001 Encounter for general adult medical examination with abnormal findings: Secondary | ICD-10-CM

## 2023-03-01 DIAGNOSIS — Z0000 Encounter for general adult medical examination without abnormal findings: Secondary | ICD-10-CM | POA: Diagnosis not present

## 2023-03-01 DIAGNOSIS — E785 Hyperlipidemia, unspecified: Secondary | ICD-10-CM | POA: Diagnosis not present

## 2023-03-01 DIAGNOSIS — Z111 Encounter for screening for respiratory tuberculosis: Secondary | ICD-10-CM | POA: Diagnosis not present

## 2023-03-01 DIAGNOSIS — Z136 Encounter for screening for cardiovascular disorders: Secondary | ICD-10-CM | POA: Diagnosis not present

## 2023-03-01 DIAGNOSIS — Z1211 Encounter for screening for malignant neoplasm of colon: Secondary | ICD-10-CM

## 2023-03-01 DIAGNOSIS — I10 Essential (primary) hypertension: Secondary | ICD-10-CM

## 2023-03-01 DIAGNOSIS — E039 Hypothyroidism, unspecified: Secondary | ICD-10-CM

## 2023-03-01 DIAGNOSIS — Z6830 Body mass index (BMI) 30.0-30.9, adult: Secondary | ICD-10-CM

## 2023-03-01 LAB — LIPID PANEL

## 2023-03-01 MED ORDER — LOSARTAN POTASSIUM 100 MG PO TABS: 100.0000 mg | ORAL_TABLET | Freq: Every day | ORAL | 1 refills | Status: DC

## 2023-03-01 MED ORDER — LEVOTHYROXINE SODIUM 88 MCG PO TABS: 88.0000 ug | ORAL_TABLET | Freq: Every day | ORAL | 1 refills | Status: DC

## 2023-03-01 MED ORDER — AMLODIPINE BESYLATE 10 MG PO TABS: 10.0000 mg | ORAL_TABLET | Freq: Every day | ORAL | 1 refills | Status: DC

## 2023-03-01 NOTE — Progress Notes (Signed)
Subjective:    Patient ID: Brooke Newton, female    DOB: 05-17-1961, 62 y.o.   MRN: [id number]   Chief Complaint: annual physical    HPI:  Brooke Newton is a 62 y.o. who identifies as a female who was assigned female at birth.   Social history: Lives with: husband Work history: retired   Scientist, forensic in today for follow up of the following chronic medical issues:  1. Essential hypertension, benign No c/o chest pain, sob or earache. Doe snot check blood pressure at home. BP Readings from Last 3 Encounters:  08/31/22 132/81  07/14/22 139/81  05/28/22 (!) 166/92     2. Hyperlipidemia with target LDL less than 100 Does watch diet and stays very active. Patient says statins make her feel bad.  Lab Results  Component Value Date   CHOL 214 (H) 08/31/2022   HDL 54 08/31/2022   LDLCALC 142 (H) 08/31/2022   TRIG 102 08/31/2022   CHOLHDL 4.0 08/31/2022     3. Late effect of cerebrovascular accident No permanent effects  4. Acquired hypothyroidism No issue that she is aware of. ' Lab Results  Component Value Date   TSH 4.090 08/31/2022     5. BMI 30.0-30.9,adult Eight is up 5lbs Wt Readings from Last 3 Encounters:  03/01/23 167 lb (75.8 kg)  08/31/22 162 lb (73.5 kg)  07/14/22 163 lb (73.9 kg)   BMI Readings from Last 3 Encounters:  03/01/23 30.54 kg/m  08/31/22 29.63 kg/m  07/14/22 29.81 kg/m       New complaints: None today  No Known Allergies Outpatient Encounter Medications as of 03/01/2023  Medication Sig   amLODipine (NORVASC) 10 MG tablet Take 1 tablet (10 mg total) by mouth daily.   aspirin 325 MG tablet Take 325 mg by mouth daily.   diclofenac (VOLTAREN) 75 MG EC tablet Take 1 tablet (75 mg total) by mouth 2 (two) times daily.   levothyroxine (SYNTHROID) 88 MCG tablet Take 1 tablet (88 mcg total) by mouth daily.   losartan (COZAAR) 100 MG tablet Take 1 tablet (100 mg total) by mouth daily.   No facility-administered encounter medications on  file as of 03/01/2023.    Past Surgical History:  Procedure Laterality Date   CESAREAN SECTION      Family History  Problem Relation Age of Onset   Hypertension Mother    Stroke Mother    Hypertension Brother       Controlled substance contract: n/a     Review of Systems  Constitutional:  Negative for diaphoresis.  Eyes:  Negative for pain.  Respiratory:  Negative for shortness of breath.   Cardiovascular:  Negative for chest pain, palpitations and leg swelling.  Gastrointestinal:  Negative for abdominal pain.  Endocrine: Negative for polydipsia.  Skin:  Negative for rash.  Neurological:  Negative for dizziness, weakness and headaches.  Hematological:  Does not bruise/bleed easily.  All other systems reviewed and are negative.      Objective:   Physical Exam Vitals and nursing note reviewed.  Constitutional:      General: She is not in acute distress.    Appearance: Normal appearance. She is well-developed.  HENT:     Head: Normocephalic.     Right Ear: Tympanic membrane normal.     Left Ear: Tympanic membrane normal.     Nose: Nose normal.     Mouth/Throat:     Mouth: Mucous membranes are moist.  Eyes:     Pupils: Pupils are  equal, round, and reactive to light.  Neck:     Vascular: No carotid bruit or JVD.  Cardiovascular:     Rate and Rhythm: Normal rate and regular rhythm.     Heart sounds: Normal heart sounds.  Pulmonary:     Effort: Pulmonary effort is normal. No respiratory distress.     Breath sounds: Normal breath sounds. No wheezing or rales.  Chest:     Chest wall: No tenderness.  Abdominal:     General: Bowel sounds are normal. There is no distension or abdominal bruit.     Palpations: Abdomen is soft. There is no hepatomegaly, splenomegaly, mass or pulsatile mass.     Tenderness: There is no abdominal tenderness.  Musculoskeletal:        General: Normal range of motion.     Cervical back: Normal range of motion and neck supple.   Lymphadenopathy:     Cervical: No cervical adenopathy.  Skin:    General: Skin is warm and dry.  Neurological:     Mental Status: She is alert and oriented to person, place, and time.     Deep Tendon Reflexes: Reflexes are normal and symmetric.  Psychiatric:        Behavior: Behavior normal.        Thought Content: Thought content normal.        Judgment: Judgment normal.    BP (!) 161/98   Pulse 87   Temp 97.8 F (36.6 C) (Temporal)   Resp 20   Ht '5\' 2"'$  (1.575 m)   Wt 167 lb (75.8 kg)   SpO2 96%   BMI 30.54 kg/m   EKG- NSR-Brooke Hassell Done, FNP  Chest xray- no acute or chronic findings       Assessment & Plan:   Brooke Newton comes in today with chief complaint of Annual Exam   Diagnosis and orders addressed:  1. Annual physical exam - DG Chest 2 View - EKG 12-Lead - VITAMIN D 25 Hydroxy (Vit-D Deficiency, Fractures)  2. Essential hypertension, benign Low sodium diet - DG Chest 2 View - EKG 12-Lead - CBC with Differential/Platelet - CMP14+EGFR - amLODipine (NORVASC) 10 MG tablet; Take 1 tablet (10 mg total) by mouth daily.  Dispense: 90 tablet; Refill: 1 - losartan (COZAAR) 100 MG tablet; Take 1 tablet (100 mg total) by mouth daily.  Dispense: 90 tablet; Refill: 1  3. Hyperlipidemia with target LDL less than 100 Low fatdiet - Lipid panel  4. Late effect of cerebrovascular accident  5. Acquired hypothyroidism Labs pending - Thyroid Panel With TSH - levothyroxine (SYNTHROID) 88 MCG tablet; Take 1 tablet (88 mcg total) by mouth daily.  Dispense: 90 tablet; Refill: 1  6. BMI 30.0-30.9,adult Discussed diet and exercise for person with BMI >25 Will recheck weight in 3-6 months   7. Encounter for screening colonoscopy Referral made - Ambulatory referral to Gastroenterology   Labs pending Health Maintenance reviewed Diet and exercise encouraged  Follow up plan: 6 months   Brooke Hassell Done, FNP

## 2023-03-01 NOTE — Patient Instructions (Signed)
Exercising to Stay Healthy To become healthy and stay healthy, it is recommended that you do moderate-intensity and vigorous-intensity exercise. You can tell that you are exercising at a moderate intensity if your heart starts beating faster and you start breathing faster but can still hold a conversation. You can tell that you are exercising at a vigorous intensity if you are breathing much harder and faster and cannot hold a conversation while exercising. How can exercise benefit me? Exercising regularly is important. It has many health benefits, such as: Improving overall fitness, flexibility, and endurance. Increasing bone density. Helping with weight control. Decreasing body fat. Increasing muscle strength and endurance. Reducing stress and tension, anxiety, depression, or anger. Improving overall health. What guidelines should I follow while exercising? Before you start a new exercise program, talk with your health care provider. Do not exercise so much that you hurt yourself, feel dizzy, or get very short of breath. Wear comfortable clothes and wear shoes with good support. Drink plenty of water while you exercise to prevent dehydration or heat stroke. Work out until your breathing and your heartbeat get faster (moderate intensity). How often should I exercise? Choose an activity that you enjoy, and set realistic goals. Your health care provider can help you make an activity plan that is individually designed and works best for you. Exercise regularly as told by your health care provider. This may include: Doing strength training two times a week, such as: Lifting weights. Using resistance bands. Push-ups. Sit-ups. Yoga. Doing a certain intensity of exercise for a given amount of time. Choose from these options: A total of 150 minutes of moderate-intensity exercise every week. A total of 75 minutes of vigorous-intensity exercise every week. A mix of moderate-intensity and  vigorous-intensity exercise every week. Children, pregnant women, people who have not exercised regularly, people who are overweight, and older adults may need to talk with a health care provider about what activities are safe to perform. If you have a medical condition, be sure to talk with your health care provider before you start a new exercise program. What are some exercise ideas? Moderate-intensity exercise ideas include: Walking 1 mile (1.6 km) in about 15 minutes. Biking. Hiking. Golfing. Dancing. Water aerobics. Vigorous-intensity exercise ideas include: Walking 4.5 miles (7.2 km) or more in about 1 hour. Jogging or running 5 miles (8 km) in about 1 hour. Biking 10 miles (16.1 km) or more in about 1 hour. Lap swimming. Roller-skating or in-line skating. Cross-country skiing. Vigorous competitive sports, such as football, basketball, and soccer. Jumping rope. Aerobic dancing. What are some everyday activities that can help me get exercise? Yard work, such as: Pushing a lawn mower. Raking and bagging leaves. Washing your car. Pushing a stroller. Shoveling snow. Gardening. Washing windows or floors. How can I be more active in my day-to-day activities? Use stairs instead of an elevator. Take a walk during your lunch break. If you drive, park your car farther away from your work or school. If you take public transportation, get off one stop early and walk the rest of the way. Stand up or walk around during all of your indoor phone calls. Get up, stretch, and walk around every 30 minutes throughout the day. Enjoy exercise with a friend. Support to continue exercising will help you keep a regular routine of activity. Where to find more information You can find more information about exercising to stay healthy from: U.S. Department of Health and Human Services: www.hhs.gov Centers for Disease Control and Prevention (  CDC): www.cdc.gov Summary Exercising regularly is  important. It will improve your overall fitness, flexibility, and endurance. Regular exercise will also improve your overall health. It can help you control your weight, reduce stress, and improve your bone density. Do not exercise so much that you hurt yourself, feel dizzy, or get very short of breath. Before you start a new exercise program, talk with your health care provider. This information is not intended to replace advice given to you by your health care provider. Make sure you discuss any questions you have with your health care provider. Document Revised: 04/04/2021 Document Reviewed: 04/04/2021 Elsevier Patient Education  2023 Elsevier Inc.  

## 2023-03-02 LAB — CMP14+EGFR
ALT: 15 IU/L (ref 0–32)
AST: 17 IU/L (ref 0–40)
Albumin/Globulin Ratio: 1.7 (ref 1.2–2.2)
Albumin: 4.5 g/dL (ref 3.9–4.9)
Alkaline Phosphatase: 109 IU/L (ref 44–121)
BUN/Creatinine Ratio: 11 — ABNORMAL LOW (ref 12–28)
BUN: 8 mg/dL (ref 8–27)
Bilirubin Total: 0.4 mg/dL (ref 0.0–1.2)
CO2: 21 mmol/L (ref 20–29)
Calcium: 10.1 mg/dL (ref 8.7–10.3)
Chloride: 105 mmol/L (ref 96–106)
Creatinine, Ser: 0.76 mg/dL (ref 0.57–1.00)
Globulin, Total: 2.6 g/dL (ref 1.5–4.5)
Glucose: 105 mg/dL — ABNORMAL HIGH (ref 70–99)
Potassium: 4.5 mmol/L (ref 3.5–5.2)
Sodium: 143 mmol/L (ref 134–144)
Total Protein: 7.1 g/dL (ref 6.0–8.5)
eGFR: 88 mL/min/{1.73_m2} (ref >59)

## 2023-03-02 LAB — VITAMIN D 25 HYDROXY (VIT D DEFICIENCY, FRACTURES): Vit D, 25-Hydroxy: 18.6 ng/mL — ABNORMAL LOW (ref 30.0–100.0)

## 2023-03-02 LAB — CBC WITH DIFFERENTIAL/PLATELET
Basophils Absolute: 0 10*3/uL (ref 0.0–0.2)
Basos: 0 %
EOS (ABSOLUTE): 0.1 10*3/uL (ref 0.0–0.4)
Eos: 1 %
Hematocrit: 45.8 % (ref 34.0–46.6)
Hemoglobin: 14.8 g/dL (ref 11.1–15.9)
Immature Grans (Abs): 0 10*3/uL (ref 0.0–0.1)
Immature Granulocytes: 0 %
Lymphocytes Absolute: 1.5 10*3/uL (ref 0.7–3.1)
Lymphs: 19 %
MCH: 29.7 pg (ref 26.6–33.0)
MCHC: 32.3 g/dL (ref 31.5–35.7)
MCV: 92 fL (ref 79–97)
Monocytes Absolute: 0.3 10*3/uL (ref 0.1–0.9)
Monocytes: 4 %
Neutrophils Absolute: 5.9 10*3/uL (ref 1.4–7.0)
Neutrophils: 76 %
Platelets: 321 10*3/uL (ref 150–450)
RBC: 4.99 x10E6/uL (ref 3.77–5.28)
RDW: 14.1 % (ref 11.7–15.4)
WBC: 7.9 10*3/uL (ref 3.4–10.8)

## 2023-03-02 LAB — THYROID PANEL WITH TSH
Free Thyroxine Index: 1.7 (ref 1.2–4.9)
T3 Uptake Ratio: 21 % — ABNORMAL LOW (ref 24–39)
T4, Total: 8.1 ug/dL (ref 4.5–12.0)
TSH: 11.6 u[IU]/mL — ABNORMAL HIGH (ref 0.450–4.500)

## 2023-03-02 LAB — LIPID PANEL
Chol/HDL Ratio: 3.7 ratio (ref 0.0–4.4)
Cholesterol, Total: 222 mg/dL — ABNORMAL HIGH (ref 100–199)
HDL: 60 mg/dL (ref >39)
LDL Chol Calc (NIH): 147 mg/dL — ABNORMAL HIGH (ref 0–99)
Triglycerides: 85 mg/dL (ref 0–149)
VLDL Cholesterol Cal: 15 mg/dL (ref 5–40)

## 2023-03-02 MED ORDER — VITAMIN D (ERGOCALCIFEROL) 1.25 MG (50000 UNIT) PO CAPS: 50000.0000 [IU] | ORAL_CAPSULE | ORAL | 1 refills | Status: DC

## 2023-03-02 MED ORDER — LEVOTHYROXINE SODIUM 100 MCG PO TABS: 100.0000 ug | ORAL_TABLET | Freq: Every day | ORAL | 3 refills | Status: DC

## 2023-03-02 NOTE — Addendum Note (Signed)
Addended by: Chevis Pretty on: 03/02/2023 11:39 AM   Modules accepted: Orders

## 2023-03-04 ENCOUNTER — Other Ambulatory Visit: Payer: Self-pay

## 2023-03-04 DIAGNOSIS — R7989 Other specified abnormal findings of blood chemistry: Secondary | ICD-10-CM

## 2023-05-08 ENCOUNTER — Other Ambulatory Visit: Payer: Self-pay | Admitting: Nurse Practitioner

## 2023-05-18 DIAGNOSIS — Z1211 Encounter for screening for malignant neoplasm of colon: Secondary | ICD-10-CM | POA: Diagnosis not present

## 2023-05-18 DIAGNOSIS — K5730 Diverticulosis of large intestine without perforation or abscess without bleeding: Secondary | ICD-10-CM | POA: Diagnosis not present

## 2023-05-31 ENCOUNTER — Other Ambulatory Visit: Payer: BC Managed Care – PPO

## 2023-05-31 DIAGNOSIS — R7989 Other specified abnormal findings of blood chemistry: Secondary | ICD-10-CM | POA: Diagnosis not present

## 2023-05-31 DIAGNOSIS — E039 Hypothyroidism, unspecified: Secondary | ICD-10-CM | POA: Diagnosis not present

## 2023-06-01 LAB — THYROID PANEL WITH TSH
Free Thyroxine Index: 3.6 (ref 1.2–4.9)
T3 Uptake Ratio: 31 % (ref 24–39)
T4, Total: 11.7 ug/dL (ref 4.5–12.0)
TSH: 2.17 u[IU]/mL (ref 0.450–4.500)

## 2023-06-02 ENCOUNTER — Ambulatory Visit: Payer: BC Managed Care – PPO | Admitting: Family Medicine

## 2023-06-02 ENCOUNTER — Encounter: Payer: Self-pay | Admitting: Family Medicine

## 2023-06-02 VITALS — BP 151/87 | HR 85 | Temp 98.3°F | Wt 163.0 lb

## 2023-06-02 DIAGNOSIS — E559 Vitamin D deficiency, unspecified: Secondary | ICD-10-CM

## 2023-06-02 DIAGNOSIS — I10 Essential (primary) hypertension: Secondary | ICD-10-CM

## 2023-06-02 DIAGNOSIS — R400 Somnolence: Secondary | ICD-10-CM

## 2023-06-02 DIAGNOSIS — R5383 Other fatigue: Secondary | ICD-10-CM

## 2023-06-02 DIAGNOSIS — R6889 Other general symptoms and signs: Secondary | ICD-10-CM | POA: Diagnosis not present

## 2023-06-02 NOTE — Progress Notes (Signed)
Acute Office Visit  Subjective:  Patient ID: Brooke Newton, female    DOB: 12-Nov-1961, 62 y.o.   MRN: [id number]  Chief Complaint  Patient presents with   Fatigue    X 4 days    HPI Patient is in today for fatigue that has been going on for a few days. States that she was worried it was her thyroid but her levels were recently checked and they were normal. States that she has a slight cough. States that in the past she has had UTIs without symtpoms until she had fever and was not sure if that was the cause. She is not having any symptoms at this time. States that she finished her course of vitamin D last week. States that she did not start a daily supplement of it.  States that she had a colonoscopy recently and she was told afterward that her oxygen level dropped during it. She states that her husband would say she snores at night. States that she is tired throughout the day. States that sometimes she sleeps well and sometimes she does not. Reports that she gets "really sleepy" in the afternoons.   ROS As per HPI   Objective:  BP (!) 151/87   Pulse 85   Temp 98.3 F (36.8 C)   Wt 163 lb (73.9 kg)   SpO2 98%   BMI 29.81 kg/m   Physical Exam Constitutional:      General: She is awake. She is not in acute distress.    Appearance: Normal appearance. She is well-developed, well-groomed and overweight. She is not ill-appearing, toxic-appearing or diaphoretic.  Cardiovascular:     Rate and Rhythm: Normal rate.     Pulses: Normal pulses.          Radial pulses are 2+ on the right side and 2+ on the left side.       Posterior tibial pulses are 2+ on the right side and 2+ on the left side.     Heart sounds: Normal heart sounds. No murmur heard.    No gallop.  Pulmonary:     Effort: Pulmonary effort is normal. No respiratory distress.     Breath sounds: Normal breath sounds. No stridor. No wheezing, rhonchi or rales.  Musculoskeletal:     Cervical back: Full passive range of  motion without pain and neck supple.     Right lower leg: No edema.     Left lower leg: No edema.  Skin:    General: Skin is warm.     Capillary Refill: Capillary refill takes less than 2 seconds.  Neurological:     General: No focal deficit present.     Mental Status: She is alert, oriented to person, place, and time and easily aroused. Mental status is at baseline.     GCS: GCS eye subscore is 4. GCS verbal subscore is 5. GCS motor subscore is 6.     Motor: No weakness.  Psychiatric:        Attention and Perception: Attention and perception normal.        Mood and Affect: Mood and affect normal.        Speech: Speech normal.        Behavior: Behavior normal. Behavior is cooperative.        Thought Content: Thought content normal. Thought content does not include homicidal or suicidal ideation. Thought content does not include homicidal or suicidal plan.        Cognition and Memory: Cognition  and memory normal.        Judgment: Judgment normal.    Assessment & Plan:  1. Other fatigue Labs as below. Will communicate results to patient once available.  Provided written and verbal instructions on managing fatigue. Will assess for cause with labs and sleep study. Instructed patient to follow up if symptoms persist.  - Anemia Profile B - CMP14+EGFR  2. Has daytime drowsiness Provided written and verbal instructions on managing fatigue. Will assess for cause with labs and sleep study. Instructed patient to follow up if symptoms persist.  - Ambulatory referral to Sleep Studies  3. Vitamin D deficiency Labs as below. Will communicate results to patient once available.  - VITAMIN D 25 Hydroxy (Vit-D Deficiency, Fractures)  4. Essential hypertension, benign Patient BP not at goal today. Reports that she has not been checking her BP at home lately. States that she has a monitor at home and can start checking it. Instructed patient on proper home monitoring. Instructed patient to report  measurements to PCP to determine next steps for BP management.   The above assessment and management plan was discussed with the patient. The patient verbalized understanding of and has agreed to the management plan using shared-decision making. Patient is aware to call the clinic if they develop any new symptoms or if symptoms fail to improve or worsen. Patient is aware when to return to the clinic for a follow-up visit. Patient educated on when it is appropriate to go to the emergency department.   Return if symptoms worsen or fail to improve.  Neale Burly, DNP-FNP [facility] [address] [place], [place] [address] [phone]

## 2023-06-03 LAB — ANEMIA PROFILE B
Basophils Absolute: 0 10*3/uL (ref 0.0–0.2)
Basos: 1 %
EOS (ABSOLUTE): 0.1 10*3/uL (ref 0.0–0.4)
Eos: 1 %
Ferritin: 185 ng/mL — ABNORMAL HIGH (ref 15–150)
Folate: 10.2 ng/mL (ref >3.0)
Hematocrit: 43.3 % (ref 34.0–46.6)
Hemoglobin: 14.2 g/dL (ref 11.1–15.9)
Immature Grans (Abs): 0 10*3/uL (ref 0.0–0.1)
Immature Granulocytes: 0 %
Iron Saturation: 23 % (ref 15–55)
Iron: 73 ug/dL (ref 27–139)
Lymphocytes Absolute: 1.5 10*3/uL (ref 0.7–3.1)
Lymphs: 17 %
MCH: 29.6 pg (ref 26.6–33.0)
MCHC: 32.8 g/dL (ref 31.5–35.7)
MCV: 90 fL (ref 79–97)
Monocytes Absolute: 0.4 10*3/uL (ref 0.1–0.9)
Monocytes: 5 %
Neutrophils Absolute: 6.6 10*3/uL (ref 1.4–7.0)
Neutrophils: 76 %
Platelets: 357 10*3/uL (ref 150–450)
RBC: 4.8 x10E6/uL (ref 3.77–5.28)
RDW: 13.7 % (ref 11.7–15.4)
Retic Ct Pct: 1.6 % (ref 0.6–2.6)
Total Iron Binding Capacity: 322 ug/dL (ref 250–450)
UIBC: 249 ug/dL (ref 118–369)
Vitamin B-12: 147 pg/mL — ABNORMAL LOW (ref 232–1245)
WBC: 8.7 10*3/uL (ref 3.4–10.8)

## 2023-06-03 LAB — VITAMIN D 25 HYDROXY (VIT D DEFICIENCY, FRACTURES): Vit D, 25-Hydroxy: 43.6 ng/mL (ref 30.0–100.0)

## 2023-06-03 LAB — CMP14+EGFR
ALT: 14 IU/L (ref 0–32)
AST: 16 IU/L (ref 0–40)
Albumin/Globulin Ratio: 1.7
Albumin: 4.3 g/dL (ref 3.9–4.9)
Alkaline Phosphatase: 100 IU/L (ref 44–121)
BUN/Creatinine Ratio: 13 (ref 12–28)
BUN: 10 mg/dL (ref 8–27)
Bilirubin Total: 0.4 mg/dL (ref 0.0–1.2)
CO2: 22 mmol/L (ref 20–29)
Calcium: 9.6 mg/dL (ref 8.7–10.3)
Chloride: 102 mmol/L (ref 96–106)
Creatinine, Ser: 0.77 mg/dL (ref 0.57–1.00)
Globulin, Total: 2.5 g/dL (ref 1.5–4.5)
Glucose: 91 mg/dL (ref 70–99)
Potassium: 4.4 mmol/L (ref 3.5–5.2)
Sodium: 139 mmol/L (ref 134–144)
Total Protein: 6.8 g/dL (ref 6.0–8.5)
eGFR: 86 mL/min/{1.73_m2} (ref >59)

## 2023-06-03 NOTE — Progress Notes (Signed)
Slightly elevated ferritin, all other iron levels are normal, so will continue to monitor. Vitamin B12 is low. Recommend taking 1000 -2000 mcg daily and we can recheck labs in 8 weeks.

## 2023-07-25 ENCOUNTER — Other Ambulatory Visit: Payer: Self-pay | Admitting: Nurse Practitioner

## 2023-07-25 DIAGNOSIS — I10 Essential (primary) hypertension: Secondary | ICD-10-CM

## 2023-08-02 DIAGNOSIS — R0681 Apnea, not elsewhere classified: Secondary | ICD-10-CM | POA: Diagnosis not present

## 2023-08-03 DIAGNOSIS — R0681 Apnea, not elsewhere classified: Secondary | ICD-10-CM | POA: Diagnosis not present

## 2023-08-11 ENCOUNTER — Telehealth: Payer: Self-pay | Admitting: Nurse Practitioner

## 2023-08-11 NOTE — Telephone Encounter (Signed)
Advised ok to change brand of levothyroxine.

## 2023-08-17 DIAGNOSIS — R92323 Mammographic fibroglandular density, bilateral breasts: Secondary | ICD-10-CM | POA: Diagnosis not present

## 2023-08-17 DIAGNOSIS — Z1231 Encounter for screening mammogram for malignant neoplasm of breast: Secondary | ICD-10-CM | POA: Diagnosis not present

## 2023-08-17 LAB — HM MAMMOGRAPHY

## 2023-08-20 ENCOUNTER — Encounter: Payer: Self-pay | Admitting: Nurse Practitioner

## 2023-08-31 ENCOUNTER — Ambulatory Visit: Payer: BC Managed Care – PPO | Admitting: Nurse Practitioner

## 2023-08-31 ENCOUNTER — Encounter: Payer: Self-pay | Admitting: Nurse Practitioner

## 2023-08-31 VITALS — BP 146/85 | HR 86 | Temp 97.8°F | Resp 20 | Ht 62.0 in | Wt 163.0 lb

## 2023-08-31 DIAGNOSIS — I10 Essential (primary) hypertension: Secondary | ICD-10-CM

## 2023-08-31 DIAGNOSIS — E039 Hypothyroidism, unspecified: Secondary | ICD-10-CM

## 2023-08-31 DIAGNOSIS — E785 Hyperlipidemia, unspecified: Secondary | ICD-10-CM

## 2023-08-31 DIAGNOSIS — Z6830 Body mass index (BMI) 30.0-30.9, adult: Secondary | ICD-10-CM

## 2023-08-31 DIAGNOSIS — I6930 Unspecified sequelae of cerebral infarction: Secondary | ICD-10-CM

## 2023-08-31 MED ORDER — LOSARTAN POTASSIUM 100 MG PO TABS: 100.0000 mg | ORAL_TABLET | Freq: Every day | ORAL | 1 refills | Status: DC

## 2023-08-31 MED ORDER — AMLODIPINE BESYLATE 10 MG PO TABS: 10.0000 mg | ORAL_TABLET | Freq: Every day | ORAL | 1 refills | Status: DC

## 2023-08-31 MED ORDER — LEVOTHYROXINE SODIUM 100 MCG PO TABS: 100.0000 ug | ORAL_TABLET | Freq: Every day | ORAL | 1 refills | Status: DC

## 2023-08-31 NOTE — Progress Notes (Signed)
Subjective:    Patient ID: Brooke Newton, female    DOB: [birth year], 62 y.o.   MRN: [id number]   Chief Complaint: medical management of chronic issues     HPI:  Brooke Newton is a 62 y.o. who identifies as a female who was assigned female at birth.   Social history: Lives with: husband Work history: retired   Water engineer in today for follow up of the following chronic medical issues:  1. Essential hypertension, benign No c/o chest pain, sob or headache. Does not check blood pressure at home. BP Readings from Last 3 Encounters:  06/02/23 (!) 151/87  03/01/23 (!) 161/98  08/31/22 132/81     2. Hyperlipidemia with target LDL less than 100 Does watch diet but does not do much exercise. Refuses statin. Lab Results  Component Value Date   CHOL 222 (H) 03/01/2023   HDL 60 03/01/2023   LDLCALC 147 (H) 03/01/2023   TRIG 85 03/01/2023   CHOLHDL 3.7 03/01/2023     3. Acquired hypothyroidism No issues that she is aware of. Lab Results  Component Value Date   TSH 2.170 05/31/2023    4. Late effect of cerebrovascular accident No permanent effects  5. BMI 30.0-30.9,adult No recent weight changes Wt Readings from Last 3 Encounters:  08/31/23 163 lb (73.9 kg)  06/02/23 163 lb (73.9 kg)  03/01/23 167 lb (75.8 kg)   BMI Readings from Last 3 Encounters:  08/31/23 29.81 kg/m  06/02/23 29.81 kg/m  03/01/23 30.54 kg/m      New complaints: None today  No Known Allergies Outpatient Encounter Medications as of 08/31/2023  Medication Sig   amLODipine (NORVASC) 10 MG tablet TAKE 1 TABLET BY MOUTH DAILY   aspirin 325 MG tablet Take 325 mg by mouth daily.   levothyroxine (SYNTHROID) 100 MCG tablet Take 1 tablet (100 mcg total) by mouth daily.   losartan (COZAAR) 100 MG tablet TAKE 1 TABLET BY MOUTH DAILY   Vitamin D, Ergocalciferol, (DRISDOL) 1.25 MG (50000 UNIT) CAPS capsule TAKE 1 CAPSULE BY MOUTH EVERY 7  DAYS   No facility-administered encounter medications on file  as of 08/31/2023.    Past Surgical History:  Procedure Laterality Date   CESAREAN SECTION      Family History  Problem Relation Age of Onset   Hypertension Mother    Stroke Mother    Hypertension Brother       Controlled substance contract: n/a     Review of Systems  Constitutional:  Negative for diaphoresis.  Eyes:  Negative for pain.  Respiratory:  Negative for shortness of breath.   Cardiovascular:  Negative for chest pain, palpitations and leg swelling.  Gastrointestinal:  Negative for abdominal pain.  Endocrine: Negative for polydipsia.  Skin:  Negative for rash.  Neurological:  Negative for dizziness, weakness and headaches.  Hematological:  Does not bruise/bleed easily.  All other systems reviewed and are negative.      Objective:   Physical Exam Vitals and nursing note reviewed.  Constitutional:      General: She is not in acute distress.    Appearance: Normal appearance. She is well-developed.  HENT:     Head: Normocephalic.     Right Ear: Tympanic membrane normal.     Left Ear: Tympanic membrane normal.     Nose: Nose normal.     Mouth/Throat:     Mouth: Mucous membranes are moist.  Eyes:     Pupils: Pupils are equal, round, and reactive to light.  Neck:     Vascular: No carotid bruit or JVD.  Cardiovascular:     Rate and Rhythm: Normal rate and regular rhythm.     Heart sounds: Normal heart sounds.  Pulmonary:     Effort: Pulmonary effort is normal. No respiratory distress.     Breath sounds: Normal breath sounds. No wheezing or rales.  Chest:     Chest wall: No tenderness.  Abdominal:     General: Bowel sounds are normal. There is no distension or abdominal bruit.     Palpations: Abdomen is soft. There is no hepatomegaly, splenomegaly, mass or pulsatile mass.     Tenderness: There is no abdominal tenderness.  Musculoskeletal:        General: Normal range of motion.     Cervical back: Normal range of motion and neck supple.   Lymphadenopathy:     Cervical: No cervical adenopathy.  Skin:    General: Skin is warm and dry.  Neurological:     Mental Status: She is alert and oriented to person, place, and time.     Deep Tendon Reflexes: Reflexes are normal and symmetric.  Psychiatric:        Behavior: Behavior normal.        Thought Content: Thought content normal.        Judgment: Judgment normal.    BP (!) 146/85   Pulse 86   Temp 97.8 F (36.6 C) (Temporal)   Resp 20   Ht 5\' 2"  (1.575 m)   Wt 163 lb (73.9 kg)   SpO2 97%   BMI 29.81 kg/m         Assessment & Plan:   Brooke Newton comes in today with chief complaint of Medical Management of Chronic Issues   Diagnosis and orders addressed:  1. Essential hypertension, benign Low sodium diet - amLODipine (NORVASC) 10 MG tablet; Take 1 tablet (10 mg total) by mouth daily.  Dispense: 90 tablet; Refill: 1 - losartan (COZAAR) 100 MG tablet; Take 1 tablet (100 mg total) by mouth daily.  Dispense: 90 tablet; Refill: 1 - CBC with Differential/Platelet - CMP14+EGFR  2. Hyperlipidemia with target LDL less than 100 Low fat diet - Lipid panel  3. Acquired hypothyroidism Labs pending - levothyroxine (SYNTHROID) 100 MCG tablet; Take 1 tablet (100 mcg total) by mouth daily.  Dispense: 90 tablet; Refill: 1 - Thyroid Panel With TSH  4. Late effect of cerebrovascular accident   5. BMI 30.0-30.9,adult Discussed diet and exercise for person with BMI >25 Will recheck weight in 3-6 months    Labs pending Health Maintenance reviewed Diet and exercise encouraged  Follow up plan: 6 months   Mary-Margaret Daphine Deutscher, FNP

## 2023-08-31 NOTE — Patient Instructions (Signed)
Fall Prevention in the Home, Adult Falls can cause injuries and can happen to people of all ages. There are many things you can do to make your home safer and to help prevent falls. What actions can I take to prevent falls? General information Use good lighting in all rooms. Make sure to: Replace any light bulbs that burn out. Turn on the lights in dark areas and use night-lights. Keep items that you use often in easy-to-reach places. Lower the shelves around your home if needed. Move furniture so that there are clear paths around it. Do not use throw rugs or other things on the floor that can make you trip. If any of your floors are uneven, fix them. Add color or contrast paint or tape to clearly mark and help you see: Grab bars or handrails. First and last steps of staircases. Where the edge of each step is. If you use a ladder or stepladder: Make sure that it is fully opened. Do not climb a closed ladder. Make sure the sides of the ladder are locked in place. Have someone hold the ladder while you use it. Know where your pets are as you move through your home. What can I do in the bathroom?     Keep the floor dry. Clean up any water on the floor right away. Remove soap buildup in the bathtub or shower. Buildup makes bathtubs and showers slippery. Use non-skid mats or decals on the floor of the bathtub or shower. Attach bath mats securely with double-sided, non-slip rug tape. If you need to sit down in the shower, use a non-slip stool. Install grab bars by the toilet and in the bathtub and shower. Do not use towel bars as grab bars. What can I do in the bedroom? Make sure that you have a light by your bed that is easy to reach. Do not use any sheets or blankets on your bed that hang to the floor. Have a firm chair or bench with side arms that you can use for support when you get dressed. What can I do in the kitchen? Clean up any spills right away. If you need to reach something  above you, use a step stool with a grab bar. Keep electrical cords out of the way. Do not use floor polish or wax that makes floors slippery. What can I do with my stairs? Do not leave anything on the stairs. Make sure that you have a light switch at the top and the bottom of the stairs. Make sure that there are handrails on both sides of the stairs. Fix handrails that are broken or loose. Install non-slip stair treads on all your stairs if they do not have carpet. Avoid having throw rugs at the top or bottom of the stairs. Choose a carpet that does not hide the edge of the steps on the stairs. Make sure that the carpet is firmly attached to the stairs. Fix carpet that is loose or worn. What can I do on the outside of my home? Use bright outdoor lighting. Fix the edges of walkways and driveways and fix any cracks. Clear paths of anything that can make you trip, such as tools or rocks. Add color or contrast paint or tape to clearly mark and help you see anything that might make you trip as you walk through a door, such as a raised step or threshold. Trim any bushes or trees on paths to your home. Check to see if handrails are loose   or broken and that both sides of all steps have handrails. Install guardrails along the edges of any raised decks and porches. Have leaves, snow, or ice cleared regularly. Use sand, salt, or ice melter on paths if you live where there is ice and snow during the winter. Clean up any spills in your garage right away. This includes grease or oil spills. What other actions can I take? Review your medicines with your doctor. Some medicines can cause dizziness or changes in blood pressure, which increase your risk of falling. Wear shoes that: Have a low heel. Do not wear high heels. Have rubber bottoms and are closed at the toe. Feel good on your feet and fit well. Use tools that help you move around if needed. These include: Canes. Walkers. Scooters. Crutches. Ask  your doctor what else you can do to help prevent falls. This may include seeing a physical therapist to learn to do exercises to move better and get stronger. Where to find more information Centers for Disease Control and Prevention, [employer]: cdc.gov National Institute on Aging: nia.nih.gov National Institute on Aging: nia.nih.gov Contact a doctor if: You are afraid of falling at home. You feel weak, drowsy, or dizzy at home. You fall at home. Get help right away if you: Lose consciousness or have trouble moving after a fall. Have a fall that causes a head injury. These symptoms may be an emergency. Get help right away. Call 911. Do not wait to see if the symptoms will go away. Do not drive yourself to the hospital. This information is not intended to replace advice given to you by your health care provider. Make sure you discuss any questions you have with your health care provider. Document Revised: 08/10/2022 Document Reviewed: 08/10/2022 Elsevier Patient Education  2024 [employer]  

## 2023-09-01 LAB — CBC WITH DIFFERENTIAL/PLATELET
Basophils Absolute: 0 10*3/uL (ref 0.0–0.2)
Basos: 0 %
EOS (ABSOLUTE): 0.1 10*3/uL (ref 0.0–0.4)
Eos: 1 %
Hematocrit: 44.9 % (ref 34.0–46.6)
Hemoglobin: 14.8 g/dL (ref 11.1–15.9)
Immature Grans (Abs): 0 10*3/uL (ref 0.0–0.1)
Immature Granulocytes: 0 %
Lymphocytes Absolute: 1.7 10*3/uL (ref 0.7–3.1)
Lymphs: 16 %
MCH: 30.6 pg (ref 26.6–33.0)
MCHC: 33 g/dL (ref 31.5–35.7)
MCV: 93 fL (ref 79–97)
Monocytes Absolute: 0.4 10*3/uL (ref 0.1–0.9)
Monocytes: 4 %
Neutrophils Absolute: 8.2 10*3/uL — ABNORMAL HIGH (ref 1.4–7.0)
Neutrophils: 79 %
Platelets: 329 10*3/uL (ref 150–450)
RBC: 4.84 x10E6/uL (ref 3.77–5.28)
RDW: 14.3 % (ref 11.7–15.4)
WBC: 10.4 10*3/uL (ref 3.4–10.8)

## 2023-09-01 LAB — CMP14+EGFR
ALT: 20 IU/L (ref 0–32)
AST: 19 IU/L (ref 0–40)
Albumin: 4.6 g/dL (ref 3.9–4.9)
Alkaline Phosphatase: 101 IU/L (ref 44–121)
BUN/Creatinine Ratio: 16 (ref 12–28)
BUN: 11 mg/dL (ref 8–27)
Bilirubin Total: 0.4 mg/dL (ref 0.0–1.2)
CO2: 20 mmol/L (ref 20–29)
Calcium: 9.8 mg/dL (ref 8.7–10.3)
Chloride: 102 mmol/L (ref 96–106)
Creatinine, Ser: 0.7 mg/dL (ref 0.57–1.00)
Globulin, Total: 2.6 g/dL (ref 1.5–4.5)
Glucose: 116 mg/dL — ABNORMAL HIGH (ref 70–99)
Potassium: 4.1 mmol/L (ref 3.5–5.2)
Sodium: 138 mmol/L (ref 134–144)
Total Protein: 7.2 g/dL (ref 6.0–8.5)
eGFR: 97 mL/min/{1.73_m2} (ref >59)

## 2023-09-01 LAB — THYROID PANEL WITH TSH
Free Thyroxine Index: 2.9 (ref 1.2–4.9)
T3 Uptake Ratio: 27 % (ref 24–39)
T4, Total: 10.7 ug/dL (ref 4.5–12.0)
TSH: 3.58 u[IU]/mL (ref 0.450–4.500)

## 2023-09-01 LAB — LIPID PANEL
Chol/HDL Ratio: 3.6 ratio (ref 0.0–4.4)
Cholesterol, Total: 224 mg/dL — ABNORMAL HIGH (ref 100–199)
HDL: 62 mg/dL (ref >39)
LDL Chol Calc (NIH): 150 mg/dL — ABNORMAL HIGH (ref 0–99)
Triglycerides: 67 mg/dL (ref 0–149)
VLDL Cholesterol Cal: 12 mg/dL (ref 5–40)

## 2024-02-22 ENCOUNTER — Ambulatory Visit: Payer: BC Managed Care – PPO | Admitting: Nurse Practitioner

## 2024-02-22 ENCOUNTER — Encounter: Payer: Self-pay | Admitting: Nurse Practitioner

## 2024-02-22 VITALS — BP 161/98 | HR 77 | Temp 97.1°F | Ht 62.0 in | Wt 170.0 lb

## 2024-02-22 DIAGNOSIS — E039 Hypothyroidism, unspecified: Secondary | ICD-10-CM

## 2024-02-22 DIAGNOSIS — Z6830 Body mass index (BMI) 30.0-30.9, adult: Secondary | ICD-10-CM

## 2024-02-22 DIAGNOSIS — E785 Hyperlipidemia, unspecified: Secondary | ICD-10-CM

## 2024-02-22 DIAGNOSIS — I10 Essential (primary) hypertension: Secondary | ICD-10-CM | POA: Diagnosis not present

## 2024-02-22 DIAGNOSIS — N3000 Acute cystitis without hematuria: Secondary | ICD-10-CM

## 2024-02-22 DIAGNOSIS — R8290 Unspecified abnormal findings in urine: Secondary | ICD-10-CM

## 2024-02-22 DIAGNOSIS — Z8673 Personal history of transient ischemic attack (TIA), and cerebral infarction without residual deficits: Secondary | ICD-10-CM | POA: Diagnosis not present

## 2024-02-22 LAB — CMP14+EGFR
ALT: 37 IU/L — ABNORMAL HIGH (ref 0–32)
AST: 33 IU/L (ref 0–40)
Albumin: 4.1 g/dL (ref 3.9–4.9)
Alkaline Phosphatase: 105 IU/L (ref 44–121)
BUN/Creatinine Ratio: 10 — ABNORMAL LOW (ref 12–28)
BUN: 8 mg/dL (ref 8–27)
Bilirubin Total: 0.4 mg/dL (ref 0.0–1.2)
CO2: 22 mmol/L (ref 20–29)
Calcium: 9.4 mg/dL (ref 8.7–10.3)
Chloride: 103 mmol/L (ref 96–106)
Creatinine, Ser: 0.77 mg/dL (ref 0.57–1.00)
Globulin, Total: 2.5 g/dL (ref 1.5–4.5)
Glucose: 102 mg/dL — ABNORMAL HIGH (ref 70–99)
Potassium: 4.2 mmol/L (ref 3.5–5.2)
Sodium: 140 mmol/L (ref 134–144)
Total Protein: 6.6 g/dL (ref 6.0–8.5)
eGFR: 86 mL/min/{1.73_m2} (ref >59)

## 2024-02-22 LAB — CBC WITH DIFFERENTIAL/PLATELET
Basophils Absolute: 0 10*3/uL (ref 0.0–0.2)
Basos: 1 %
EOS (ABSOLUTE): 0.2 10*3/uL (ref 0.0–0.4)
Eos: 2 %
Hematocrit: 43.6 % (ref 34.0–46.6)
Hemoglobin: 14.4 g/dL (ref 11.1–15.9)
Immature Grans (Abs): 0 10*3/uL (ref 0.0–0.1)
Immature Granulocytes: 0 %
Lymphocytes Absolute: 1.5 10*3/uL (ref 0.7–3.1)
Lymphs: 22 %
MCH: 30.4 pg (ref 26.6–33.0)
MCHC: 33 g/dL (ref 31.5–35.7)
MCV: 92 fL (ref 79–97)
Monocytes Absolute: 0.4 10*3/uL (ref 0.1–0.9)
Monocytes: 7 %
Neutrophils Absolute: 4.4 10*3/uL (ref 1.4–7.0)
Neutrophils: 68 %
Platelets: 318 10*3/uL (ref 150–450)
RBC: 4.74 x10E6/uL (ref 3.77–5.28)
RDW: 13.5 % (ref 11.7–15.4)
WBC: 6.5 10*3/uL (ref 3.4–10.8)

## 2024-02-22 LAB — LIPID PANEL
Chol/HDL Ratio: 3.9 ratio (ref 0.0–4.4)
Cholesterol, Total: 168 mg/dL (ref 100–199)
HDL: 43 mg/dL (ref >39)
LDL Chol Calc (NIH): 111 mg/dL — ABNORMAL HIGH (ref 0–99)
Triglycerides: 74 mg/dL (ref 0–149)
VLDL Cholesterol Cal: 14 mg/dL (ref 5–40)

## 2024-02-22 LAB — URINALYSIS, COMPLETE
Bilirubin, UA: NEGATIVE
Glucose, UA: NEGATIVE
Ketones, UA: NEGATIVE
Nitrite, UA: NEGATIVE
Protein,UA: NEGATIVE
Specific Gravity, UA: 1.01 (ref 1.005–1.030)
Urobilinogen, Ur: 1 mg/dL (ref 0.2–1.0)
pH, UA: 6.5 (ref 5.0–7.5)

## 2024-02-22 LAB — MICROSCOPIC EXAMINATION
RBC, Urine: NONE SEEN /HPF (ref 0–2)
Renal Epithel, UA: NONE SEEN /HPF
Yeast, UA: NONE SEEN

## 2024-02-22 MED ORDER — SULFAMETHOXAZOLE-TRIMETHOPRIM 800-160 MG PO TABS: 1.0000 | ORAL_TABLET | Freq: Two times a day (BID) | ORAL | 0 refills | Status: DC

## 2024-02-22 MED ORDER — LEVOTHYROXINE SODIUM 100 MCG PO TABS: 100.0000 ug | ORAL_TABLET | Freq: Every day | ORAL | 1 refills | Status: DC

## 2024-02-22 MED ORDER — AMLODIPINE BESYLATE 10 MG PO TABS: 10.0000 mg | ORAL_TABLET | Freq: Every day | ORAL | 1 refills | Status: DC

## 2024-02-22 MED ORDER — LOSARTAN POTASSIUM 100 MG PO TABS: 100.0000 mg | ORAL_TABLET | Freq: Every day | ORAL | 1 refills | Status: DC

## 2024-02-22 NOTE — Progress Notes (Signed)
 Subjective:    Patient ID: Brooke Newton, female    DOB: June 15, 1961, 63 y.o.   MRN: [id number]   Chief Complaint: medical management of chronic issues     HPI:  Brooke Newton is a 63 y.o. who identifies as a female who was assigned female at birth.   Social history: Lives with: husband Work history: retired   Water engineer in today for follow up of the following chronic medical issues:  1. Essential hypertension, benign No c/o chest pain, sob or headache. Does not check blood pressure at home. BP Readings from Last 3 Encounters:  08/31/23 (!) 146/85  06/02/23 (!) 151/87  03/01/23 (!) 161/98     2. Hyperlipidemia with target LDL less than 100 Does watch diet but does not do much exercise. Refuses statin. We have offered her repatha but she refuses at this time. Insurance will change when she turns 31 and she may considered it then. Lab Results  Component Value Date   CHOL 224 (H) 08/31/2023   HDL 62 08/31/2023   LDLCALC 150 (H) 08/31/2023   TRIG 67 08/31/2023   CHOLHDL 3.6 08/31/2023     3. Acquired hypothyroidism No issues that she is aware of. Lab Results  Component Value Date   TSH 3.580 08/31/2023    4. HIstory of cerebrovascular accident No permanent effects  5. BMI 30.0-30.9,adult Weight is up 7 lbs  Wt Readings from Last 3 Encounters:  02/22/24 170 lb (77.1 kg)  08/31/23 163 lb (73.9 kg)  06/02/23 163 lb (73.9 kg)   BMI Readings from Last 3 Encounters:  02/22/24 31.09 kg/m  08/31/23 29.81 kg/m  06/02/23 29.81 kg/m       New complaints: Has ear ache on right for several weeks. Sharp intermittent pain.  No Known Allergies Outpatient Encounter Medications as of 02/22/2024  Medication Sig   amLODipine (NORVASC) 10 MG tablet Take 1 tablet (10 mg total) by mouth daily.   aspirin 325 MG tablet Take 325 mg by mouth daily.   levothyroxine (SYNTHROID) 100 MCG tablet Take 1 tablet (100 mcg total) by mouth daily.   losartan (COZAAR) 100 MG tablet  Take 1 tablet (100 mg total) by mouth daily.   Prenatal Vit-Fe Fumarate-FA (M-VIT PO) Take by mouth.   No facility-administered encounter medications on file as of 02/22/2024.    Past Surgical History:  Procedure Laterality Date   CESAREAN SECTION      Family History  Problem Relation Age of Onset   Hypertension Mother    Stroke Mother    Hypertension Brother       Controlled substance contract: n/a     Review of Systems  Constitutional:  Negative for diaphoresis.  Eyes:  Negative for pain.  Respiratory:  Negative for shortness of breath.   Cardiovascular:  Negative for chest pain, palpitations and leg swelling.  Gastrointestinal:  Negative for abdominal pain.  Endocrine: Negative for polydipsia.  Skin:  Negative for rash.  Neurological:  Negative for dizziness, weakness and headaches.  Hematological:  Does not bruise/bleed easily.  All other systems reviewed and are negative.      Objective:   Physical Exam Vitals and nursing note reviewed.  Constitutional:      General: She is not in acute distress.    Appearance: Normal appearance. She is well-developed.  HENT:     Head: Normocephalic.     Right Ear: Tympanic membrane normal.     Left Ear: Tympanic membrane normal.     Nose: Nose  normal.     Mouth/Throat:     Mouth: Mucous membranes are moist.  Eyes:     Pupils: Pupils are equal, round, and reactive to light.  Neck:     Vascular: No carotid bruit or JVD.  Cardiovascular:     Rate and Rhythm: Normal rate and regular rhythm.     Heart sounds: Normal heart sounds.  Pulmonary:     Effort: Pulmonary effort is normal. No respiratory distress.     Breath sounds: Normal breath sounds. No wheezing or rales.  Chest:     Chest wall: No tenderness.  Abdominal:     General: Bowel sounds are normal. There is no distension or abdominal bruit.     Palpations: Abdomen is soft. There is no hepatomegaly, splenomegaly, mass or pulsatile mass.     Tenderness: There is  no abdominal tenderness.  Musculoskeletal:        General: Normal range of motion.     Cervical back: Normal range of motion and neck supple.  Lymphadenopathy:     Cervical: No cervical adenopathy.  Skin:    General: Skin is warm and dry.  Neurological:     Mental Status: She is alert and oriented to person, place, and time.     Deep Tendon Reflexes: Reflexes are normal and symmetric.  Psychiatric:        Behavior: Behavior normal.        Thought Content: Thought content normal.        Judgment: Judgment normal.    There were no vitals taken for this visit.        Assessment & Plan:   Brooke Newton comes in today with chief complaint of No chief complaint on file.   Diagnosis and orders addressed:  1. Essential hypertension, benign Low sodium diet - amLODipine (NORVASC) 10 MG tablet; Take 1 tablet (10 mg total) by mouth daily.  Dispense: 90 tablet; Refill: 1 - losartan (COZAAR) 100 MG tablet; Take 1 tablet (100 mg total) by mouth daily.  Dispense: 90 tablet; Refill: 1 - CBC with Differential/Platelet - CMP14+EGFR  2. Hyperlipidemia with target LDL less than 100 Low fat diet - Lipid panel  3. Acquired hypothyroidism Labs pending - levothyroxine (SYNTHROID) 100 MCG tablet; Take 1 tablet (100 mcg total) by mouth daily.  Dispense: 90 tablet; Refill: 1 - Thyroid Panel With TSH  4. History of cerebrovascular accident Report any one sided weakness  5. BMI 30.0-30.9,adult Discussed diet and exercise for person with BMI >25 Will recheck weight in 3-6 months  6. Malodorous urine -UA -Urine culture  7. Acute cystitis Take medication as prescribe Cotton underwear Take shower not bath Cranberry juice, yogurt Force fluids AZO over the counter X2 days Culture pending RTO prn - bactrim 1 po BID for 10 days #20 no refills  Labs pending Health Maintenance reviewed Diet and exercise encouraged  Follow up plan: 6 months   Mary-Margaret Daphine Deutscher, FNP

## 2024-02-22 NOTE — Addendum Note (Signed)
 Addended by: Bennie Pierini on: 02/22/2024 08:41 AM   Modules accepted: Orders

## 2024-02-22 NOTE — Patient Instructions (Signed)

## 2024-02-24 LAB — URINE CULTURE

## 2024-03-02 ENCOUNTER — Ambulatory Visit: Payer: BC Managed Care – PPO | Admitting: Nurse Practitioner

## 2024-08-24 ENCOUNTER — Ambulatory Visit: Admitting: Nurse Practitioner

## 2024-08-27 ENCOUNTER — Other Ambulatory Visit: Payer: Self-pay | Admitting: Nurse Practitioner

## 2024-08-27 DIAGNOSIS — I10 Essential (primary) hypertension: Secondary | ICD-10-CM

## 2024-08-27 DIAGNOSIS — E039 Hypothyroidism, unspecified: Secondary | ICD-10-CM

## 2024-08-29 ENCOUNTER — Ambulatory Visit: Admitting: Family Medicine

## 2024-08-29 ENCOUNTER — Encounter: Payer: Self-pay | Admitting: Family Medicine

## 2024-08-29 VITALS — BP 165/85 | HR 89 | Temp 98.0°F | Ht 62.0 in | Wt 169.0 lb

## 2024-08-29 DIAGNOSIS — Z8742 Personal history of other diseases of the female genital tract: Secondary | ICD-10-CM | POA: Diagnosis not present

## 2024-08-29 DIAGNOSIS — R300 Dysuria: Secondary | ICD-10-CM | POA: Diagnosis not present

## 2024-08-29 DIAGNOSIS — N3001 Acute cystitis with hematuria: Secondary | ICD-10-CM

## 2024-08-29 LAB — MICROSCOPIC EXAMINATION
Epithelial Cells (non renal): NONE SEEN /HPF (ref 0–10)
Renal Epithel, UA: NONE SEEN /HPF
WBC, UA: >30 /HPF — AB (ref 0–5)
Yeast, UA: NONE SEEN

## 2024-08-29 LAB — URINALYSIS, ROUTINE W REFLEX MICROSCOPIC
Bilirubin, UA: NEGATIVE
Glucose, UA: NEGATIVE
Ketones, UA: NEGATIVE
Nitrite, UA: POSITIVE — AB
Specific Gravity, UA: 1.01 (ref 1.005–1.030)
Urobilinogen, Ur: 2 mg/dL — ABNORMAL HIGH (ref 0.2–1.0)
pH, UA: 6 (ref 5.0–7.5)

## 2024-08-29 MED ORDER — FLUCONAZOLE 150 MG PO TABS: 150.0000 mg | ORAL_TABLET | Freq: Once | ORAL | 0 refills | Status: AC

## 2024-08-29 MED ORDER — SULFAMETHOXAZOLE-TRIMETHOPRIM 800-160 MG PO TABS: 1.0000 | ORAL_TABLET | Freq: Two times a day (BID) | ORAL | 0 refills | Status: AC

## 2024-08-29 NOTE — Progress Notes (Signed)
 Subjective:  Patient ID: Brooke Newton, female    DOB: 1960-12-22, 63 y.o.   MRN: [id number]  Patient Care Team: Gladis Mustard, FNP as PCP - General (Nurse Practitioner)   Chief Complaint:  Urinary Frequency (X 2-3 days )   HPI: Brooke Newton is a 63 y.o. female presenting on 08/29/2024 for Urinary Frequency (X 2-3 days )   Brooke Newton is a 63 year old female with a history of urinary tract infections who presents with symptoms of a urinary tract infection.  Symptoms began approximately two to three days ago, including mild fever, chills, and lower back pain, similar to her previous urinary tract infections. No dysuria is reported.  Her last confirmed urinary tract infection was in 2022. She recalls having symptoms earlier this year in March, but it was not a confirmed infection. She has experienced a yeast infection once with antibiotic use and has been preparing by eating more yogurt to prevent it.  She takes 325 mg of aspirin  daily since having a stroke. She drinks about four to five 16-ounce bottles of water daily and enjoys green tea, which she drinks once a day.          Relevant past medical, surgical, family, and social history reviewed and updated as indicated.  Allergies and medications reviewed and updated. Data reviewed: Chart in Epic.   Past Medical History:  Diagnosis Date   Hypertension    Shingles At age 36   Stroke [facility])    Thyroid  disease    hypothyroidism    Past Surgical History:  Procedure Laterality Date   CESAREAN SECTION      Social History   Socioeconomic History   Marital status: Married    Spouse name: norleen   Number of children: 2   Years of education: 12   Highest education level: Not on file  Occupational History   Occupation: self employed  Tobacco Use   Smoking status: Never   Smokeless tobacco: Never  Substance and Sexual Activity   Alcohol use: No   Drug use: No   Sexual activity: Yes  Other Topics Concern    Not on file  Social History Narrative   Not on file   Social Drivers of Health   Financial Resource Strain: Not on file  Food Insecurity: Not on file  Transportation Needs: Not on file  Physical Activity: Not on file  Stress: Not on file  Social Connections: Unknown (05/04/2022)   Received from [facility]   Social Network    Social Network: Not on file  Intimate Partner Violence: Unknown (03/26/2022)   Received from [facility]   HITS    Physically Hurt: Not on file    Insult or Talk Down To: Not on file    Threaten Physical Harm: Not on file    Scream or Curse: Not on file    Outpatient Encounter Medications as of 08/29/2024  Medication Sig   amLODipine  (NORVASC ) 10 MG tablet TAKE 1 TABLET BY MOUTH DAILY   aspirin  325 MG tablet Take 325 mg by mouth daily.   fluconazole  (DIFLUCAN ) 150 MG tablet Take 1 tablet (150 mg total) by mouth once for 1 dose.   levothyroxine  (SYNTHROID ) 100 MCG tablet TAKE 1 TABLET BY MOUTH DAILY   losartan  (COZAAR ) 100 MG tablet TAKE 1 TABLET BY MOUTH DAILY   sulfamethoxazole -trimethoprim  (BACTRIM  DS) 800-160 MG tablet Take 1 tablet by mouth 2 (two) times daily for 7 days.   Prenatal Vit-Fe Fumarate-FA (M-VIT  PO) Take by mouth. (Patient not taking: Reported on 08/29/2024)   [DISCONTINUED] sulfamethoxazole -trimethoprim  (BACTRIM  DS) 800-160 MG tablet Take 1 tablet by mouth 2 (two) times daily.   No facility-administered encounter medications on file as of 08/29/2024.    No Known Allergies  Pertinent ROS per HPI, otherwise unremarkable      Objective:  BP (!) 165/85   Pulse 89   Temp 98 F (36.7 C)   Ht 5' 2 (1.575 m)   Wt 169 lb (76.7 kg)   SpO2 95%   BMI 30.91 kg/m    Wt Readings from Last 3 Encounters:  08/29/24 169 lb (76.7 kg)  02/22/24 170 lb (77.1 kg)  08/31/23 163 lb (73.9 kg)    Physical Exam Vitals and nursing note reviewed.  Constitutional:      General: She is not in acute distress.    Appearance: Normal appearance. She is  obese. She is not ill-appearing, toxic-appearing or diaphoretic.  HENT:     Head: Normocephalic and atraumatic.     Nose: Nose normal.     Mouth/Throat:     Mouth: Mucous membranes are moist.  Eyes:     Pupils: Pupils are equal, round, and reactive to light.  Cardiovascular:     Rate and Rhythm: Normal rate and regular rhythm.     Heart sounds: Normal heart sounds.  Pulmonary:     Effort: Pulmonary effort is normal.     Breath sounds: Normal breath sounds.  Abdominal:     General: Bowel sounds are normal.     Palpations: Abdomen is soft.     Tenderness: There is no abdominal tenderness. There is no right CVA tenderness or left CVA tenderness.  Musculoskeletal:     Right lower leg: No edema.     Left lower leg: No edema.  Skin:    General: Skin is warm and dry.     Capillary Refill: Capillary refill takes less than 2 seconds.  Neurological:     General: No focal deficit present.     Mental Status: She is alert and oriented to person, place, and time.  Psychiatric:        Mood and Affect: Mood normal.        Behavior: Behavior normal.        Thought Content: Thought content normal.        Judgment: Judgment normal.      Results for orders placed or performed in visit on 02/22/24  Urine Culture   Collection Time: 02/22/24  8:29 AM   Specimen: Urine   UR  Result Value Ref Range   Urine Culture, Routine Final report    Organism ID, Bacteria Comment   Microscopic Examination   Collection Time: 02/22/24  8:29 AM   Urine  Result Value Ref Range   WBC, UA 6-10 (A) 0 - 5 /hpf   RBC, Urine None seen 0 - 2 /hpf   Epithelial Cells (non renal) 0-10 0 - 10 /hpf   Renal Epithel, UA None seen None seen /hpf   Bacteria, UA Few (A) None seen/Few   Yeast, UA None seen None seen  Urinalysis, Complete   Collection Time: 02/22/24  8:29 AM  Result Value Ref Range   Specific Gravity, UA 1.010 1.005 - 1.030   pH, UA 6.5 5.0 - 7.5   Color, UA Yellow Yellow   Appearance Ur Clear Clear    Leukocytes,UA 3+ (A) Negative   Protein,UA Negative Negative/Trace   Glucose, UA Negative  Negative   Ketones, UA Negative Negative   RBC, UA Trace (A) Negative   Bilirubin, UA Negative Negative   Urobilinogen, Ur 1.0 0.2 - 1.0 mg/dL   Nitrite, UA Negative Negative   Microscopic Examination See below:   Lipid panel   Collection Time: 02/22/24  8:44 AM  Result Value Ref Range   Cholesterol, Total 168 100 - 199 mg/dL   Triglycerides 74 0 - 149 mg/dL   HDL 43 >60 mg/dL   VLDL Cholesterol Cal 14 5 - 40 mg/dL   LDL Chol Calc (NIH) 888 (H) 0 - 99 mg/dL   Chol/HDL Ratio 3.9 0.0 - 4.4 ratio  CBC with Differential/Platelet   Collection Time: 02/22/24  8:44 AM  Result Value Ref Range   WBC 6.5 3.4 - 10.8 x10E3/uL   RBC 4.74 3.77 - 5.28 x10E6/uL   Hemoglobin 14.4 11.1 - 15.9 g/dL   Hematocrit 56.3 65.9 - 46.6 %   MCV 92 79 - 97 fL   MCH 30.4 26.6 - 33.0 pg   MCHC 33.0 31.5 - 35.7 g/dL   RDW 86.4 88.2 - 84.5 %   Platelets 318 150 - 450 x10E3/uL   Neutrophils 68 Not Estab. %   Lymphs 22 Not Estab. %   Monocytes 7 Not Estab. %   Eos 2 Not Estab. %   Basos 1 Not Estab. %   Neutrophils Absolute 4.4 1.4 - 7.0 x10E3/uL   Lymphocytes Absolute 1.5 0.7 - 3.1 x10E3/uL   Monocytes Absolute 0.4 0.1 - 0.9 x10E3/uL   EOS (ABSOLUTE) 0.2 0.0 - 0.4 x10E3/uL   Basophils Absolute 0.0 0.0 - 0.2 x10E3/uL   Immature Granulocytes 0 Not Estab. %   Immature Grans (Abs) 0.0 0.0 - 0.1 x10E3/uL  CMP14+EGFR   Collection Time: 02/22/24  8:44 AM  Result Value Ref Range   Glucose 102 (H) 70 - 99 mg/dL   BUN 8 8 - 27 mg/dL   Creatinine, Ser 9.22 0.57 - 1.00 mg/dL   eGFR 86 >40 fO/fpw/8.26   BUN/Creatinine Ratio 10 (L) 12 - 28   Sodium 140 134 - 144 mmol/L   Potassium 4.2 3.5 - 5.2 mmol/L   Chloride 103 96 - 106 mmol/L   CO2 22 20 - 29 mmol/L   Calcium  9.4 8.7 - 10.3 mg/dL   Total Protein 6.6 6.0 - 8.5 g/dL   Albumin 4.1 3.9 - 4.9 g/dL   Globulin, Total 2.5 1.5 - 4.5 g/dL   Bilirubin Total 0.4 0.0 -  1.2 mg/dL   Alkaline Phosphatase 105 44 - 121 IU/L   AST 33 0 - 40 IU/L   ALT 37 (H) 0 - 32 IU/L       Pertinent labs & imaging results that were available during my care of the patient were reviewed by me and considered in my medical decision making.  Assessment & Plan:  Brooke Newton was seen today for urinary frequency.  Diagnoses and all orders for this visit:  Dysuria -     Urine Culture -     Urinalysis, Routine w reflex microscopic  Acute cystitis with hematuria -     sulfamethoxazole -trimethoprim  (BACTRIM  DS) 800-160 MG tablet; Take 1 tablet by mouth 2 (two) times daily for 7 days.  History of vaginitis -     fluconazole  (DIFLUCAN ) 150 MG tablet; Take 1 tablet (150 mg total) by mouth once for 1 dose.       Urinary tract infection Acute urinary tract infection confirmed by positive nitrites, blood, and  leukocytes in urinalysis. No measured fever, but reports feeling cold. Previous culture in 2022 showed susceptibility to Bactrim . No known allergies to antibiotics. Discussed potential for systemic infection if untreated. Advised on caffeine's potential to irritate the bladder. Informed that if the culture indicates a need for a different antibiotic, adjustments will be made. - Prescribe Bactrim  twice daily for 7 days - Order urine culture and adjust antibiotics if necessary based on results - Prescribe Diflucan  for potential yeast infection post-antibiotics - Advise to drink plenty of water and limit caffeine intake - Instruct to report any worsening symptoms        Continue all other maintenance medications.  Follow up plan: Return if symptoms worsen or fail to improve.   Continue healthy lifestyle choices, including diet (rich in fruits, vegetables, and lean proteins, and low in salt and simple carbohydrates) and exercise (at least 30 minutes of moderate physical activity daily).  Educational handout given for UTI  The above assessment and management plan was discussed  with the patient. The patient verbalized understanding of and has agreed to the management plan. Patient is aware to call the clinic if they develop any new symptoms or if symptoms persist or worsen. Patient is aware when to return to the clinic for a follow-up visit. Patient educated on when it is appropriate to go to the emergency department.   Brooke Bruns, FNP-C [facility] [phone]

## 2024-08-30 ENCOUNTER — Ambulatory Visit: Payer: Self-pay | Admitting: Family Medicine

## 2024-08-30 DIAGNOSIS — N3001 Acute cystitis with hematuria: Secondary | ICD-10-CM

## 2024-09-01 LAB — URINE CULTURE

## 2024-09-12 ENCOUNTER — Other Ambulatory Visit: Payer: Self-pay | Admitting: Family Medicine

## 2024-09-12 ENCOUNTER — Other Ambulatory Visit

## 2024-09-12 DIAGNOSIS — N3001 Acute cystitis with hematuria: Secondary | ICD-10-CM | POA: Diagnosis not present

## 2024-09-14 ENCOUNTER — Ambulatory Visit: Payer: Self-pay | Admitting: Family Medicine

## 2024-09-14 LAB — URINE CULTURE

## 2024-09-29 ENCOUNTER — Other Ambulatory Visit: Payer: Self-pay | Admitting: Nurse Practitioner

## 2024-09-29 DIAGNOSIS — E039 Hypothyroidism, unspecified: Secondary | ICD-10-CM

## 2024-09-29 DIAGNOSIS — I10 Essential (primary) hypertension: Secondary | ICD-10-CM

## 2024-09-29 MED ORDER — LEVOTHYROXINE SODIUM 100 MCG PO TABS: 100.0000 ug | ORAL_TABLET | Freq: Every day | ORAL | 0 refills | Status: DC

## 2024-09-29 MED ORDER — AMLODIPINE BESYLATE 10 MG PO TABS: 10.0000 mg | ORAL_TABLET | Freq: Every day | ORAL | 0 refills | Status: DC

## 2024-09-29 MED ORDER — LOSARTAN POTASSIUM 100 MG PO TABS: 100.0000 mg | ORAL_TABLET | Freq: Every day | ORAL | 0 refills | Status: DC

## 2024-09-29 NOTE — Telephone Encounter (Signed)
 Copied from CRM [id number]. Topic: Clinical - Medication Refill >> Sep 29, 2024  1:16 PM Dominique E wrote: Medication:  losartan  (COZAAR ) 100 MG tablet levothyroxine  (SYNTHROID ) 100 MCG tablet amLODipine  (NORVASC ) 10 MG tablet  Has the patient contacted their pharmacy? Yes (Agent: If no, request that the patient contact the pharmacy for the refill. If patient does not wish to contact the pharmacy document the reason why and proceed with request.) (Agent: If yes, when and what did the pharmacy advise?)  This is the patient's preferred pharmacy:   [facility] - [place], [place] - [address] [place] [address] Phone: [phone] Fax: [phone]  Is this the correct pharmacy for this prescription? Yes If no, delete pharmacy and type the correct one.   Has the prescription been filled recently? Yes  Is the patient out of the medication? No. But must wait on delivery   Has the patient been seen for an appointment in the last year OR does the patient have an upcoming appointment? Yes  Can we respond through MyChart? Yes  Agent: Please be advised that Rx refills may take up to 3 business days. We ask that you follow-up with your pharmacy.

## 2024-10-02 ENCOUNTER — Ambulatory Visit: Admitting: Nurse Practitioner

## 2024-10-09 ENCOUNTER — Ambulatory Visit: Admitting: Nurse Practitioner

## 2024-10-09 ENCOUNTER — Encounter: Payer: Self-pay | Admitting: Nurse Practitioner

## 2024-10-09 ENCOUNTER — Other Ambulatory Visit (HOSPITAL_COMMUNITY)
Admission: RE | Admit: 2024-10-09 | Discharge: 2024-10-09 | Disposition: A | Source: Ambulatory Visit | Attending: Nurse Practitioner | Admitting: Nurse Practitioner

## 2024-10-09 ENCOUNTER — Other Ambulatory Visit

## 2024-10-09 VITALS — BP 174/93 | HR 78 | Temp 97.3°F | Ht 62.0 in | Wt 168.0 lb

## 2024-10-09 DIAGNOSIS — E785 Hyperlipidemia, unspecified: Secondary | ICD-10-CM | POA: Diagnosis not present

## 2024-10-09 DIAGNOSIS — Z0000 Encounter for general adult medical examination without abnormal findings: Secondary | ICD-10-CM

## 2024-10-09 DIAGNOSIS — I10 Essential (primary) hypertension: Secondary | ICD-10-CM

## 2024-10-09 DIAGNOSIS — Z0001 Encounter for general adult medical examination with abnormal findings: Secondary | ICD-10-CM

## 2024-10-09 DIAGNOSIS — E039 Hypothyroidism, unspecified: Secondary | ICD-10-CM | POA: Diagnosis not present

## 2024-10-09 DIAGNOSIS — Z23 Encounter for immunization: Secondary | ICD-10-CM

## 2024-10-09 DIAGNOSIS — Z6830 Body mass index (BMI) 30.0-30.9, adult: Secondary | ICD-10-CM

## 2024-10-09 LAB — LIPID PANEL

## 2024-10-09 NOTE — Progress Notes (Signed)
 Subjective:    Patient ID: Brooke Newton, female    DOB: 22-Sep-1961, 63 y.o.   MRN: [id number]   Chief Complaint: annual physical    HPI:  Brooke Newton is a 63 y.o. who identifies as a female who was assigned female at birth.   Social history: Lives with: husband Work history: retired   Water engineer in today for follow up of the following chronic medical issues:  1. Essential hypertension, benign No c/o chest pain, sob or earache. Does  check blood pressure at home. Runs 130's systolic. Blood pressure is always high at doctors office. BP Readings from Last 3 Encounters:  10/09/24 (!) 174/93  08/29/24 (!) 165/85  02/22/24 (!) 161/98      2. Hyperlipidemia with target LDL less than 100 Does watch diet and stays very active. Patient says statins make her feel bad.  Lab Results  Component Value Date   CHOL 168 02/22/2024   HDL 43 02/22/2024   LDLCALC 111 (H) 02/22/2024   TRIG 74 02/22/2024   CHOLHDL 3.9 02/22/2024     3. Late effect of cerebrovascular accident No permanent effects  4. Acquired hypothyroidism No issue that she is aware of. ' Lab Results  Component Value Date   TSH 3.580 08/31/2023     5. BMI 30.0-30.9,adult Weight is up 5lbs Wt Readings from Last 3 Encounters:  08/29/24 169 lb (76.7 kg)  02/22/24 170 lb (77.1 kg)  08/31/23 163 lb (73.9 kg)   BMI Readings from Last 3 Encounters:  08/29/24 30.91 kg/m  02/22/24 31.09 kg/m  08/31/23 29.81 kg/m       New complaints: None today  No Known Allergies Outpatient Encounter Medications as of 10/09/2024  Medication Sig   amLODipine  (NORVASC ) 10 MG tablet Take 1 tablet (10 mg total) by mouth daily.   aspirin  325 MG tablet Take 325 mg by mouth daily.   levothyroxine  (SYNTHROID ) 100 MCG tablet Take 1 tablet (100 mcg total) by mouth daily.   losartan  (COZAAR ) 100 MG tablet Take 1 tablet (100 mg total) by mouth daily.   Prenatal Vit-Fe Fumarate-FA (M-VIT PO) Take by mouth. (Patient not taking:  Reported on 08/29/2024)   No facility-administered encounter medications on file as of 10/09/2024.    Past Surgical History:  Procedure Laterality Date   CESAREAN SECTION      Family History  Problem Relation Age of Onset   Hypertension Mother    Stroke Mother    Hypertension Brother       Controlled substance contract: n/a     Review of Systems  Constitutional:  Negative for diaphoresis.  Eyes:  Negative for pain.  Respiratory:  Negative for shortness of breath.   Cardiovascular:  Negative for chest pain, palpitations and leg swelling.  Gastrointestinal:  Negative for abdominal pain.  Endocrine: Negative for polydipsia.  Skin:  Negative for rash.  Neurological:  Negative for dizziness, weakness and headaches.  Hematological:  Does not bruise/bleed easily.  All other systems reviewed and are negative.      Objective:   Physical Exam Vitals and nursing note reviewed.  Constitutional:      General: She is not in acute distress.    Appearance: Normal appearance. She is well-developed.  HENT:     Head: Normocephalic.     Right Ear: Tympanic membrane normal.     Left Ear: Tympanic membrane normal.     Nose: Nose normal.     Mouth/Throat:     Mouth: Mucous membranes are moist.  Eyes:     Pupils: Pupils are equal, round, and reactive to light.  Neck:     Vascular: No carotid bruit or JVD.  Cardiovascular:     Rate and Rhythm: Normal rate and regular rhythm.     Heart sounds: Normal heart sounds.  Pulmonary:     Effort: Pulmonary effort is normal. No respiratory distress.     Breath sounds: Normal breath sounds. No wheezing or rales.  Chest:     Chest wall: No tenderness.  Abdominal:     General: Bowel sounds are normal. There is no distension or abdominal bruit.     Palpations: Abdomen is soft. There is no hepatomegaly, splenomegaly, mass or pulsatile mass.     Tenderness: There is no abdominal tenderness.  Genitourinary:    Comments: Cervix nonparous and  pink No adnexal masses or tenderness Musculoskeletal:        General: Normal range of motion.     Cervical back: Normal range of motion and neck supple.  Lymphadenopathy:     Cervical: No cervical adenopathy.  Skin:    General: Skin is warm and dry.  Neurological:     Mental Status: She is alert and oriented to person, place, and time.     Deep Tendon Reflexes: Reflexes are normal and symmetric.  Psychiatric:        Behavior: Behavior normal.        Thought Content: Thought content normal.        Judgment: Judgment normal.    BP (!) 174/93   Pulse 78   Temp (!) 97.3 F (36.3 C) (Temporal)   Ht 5' 2 (1.575 m)   Wt 168 lb (76.2 kg)   SpO2 96%   BMI 30.73 kg/m         Assessment & Plan:   Brooke Newton comes in today with chief complaint of ANNUAL PHYSICAL  Diagnosis and orders addressed:  1. Annual physical exam - DG Chest 2 View - EKG 12-Lead - VITAMIN D  25 Hydroxy (Vit-D Deficiency, Fractures)  2. Essential hypertension, benign Low sodium diet - DG Chest 2 View - EKG 12-Lead - CBC with Differential/Platelet - CMP14+EGFR - amLODipine  (NORVASC ) 10 MG tablet; Take 1 tablet (10 mg total) by mouth daily.  Dispense: 90 tablet; Refill: 1 - losartan  (COZAAR ) 100 MG tablet; Take 1 tablet (100 mg total) by mouth daily.  Dispense: 90 tablet; Refill: 1  3. Hyperlipidemia with target LDL less than 100 Low fatdiet - Lipid panel  4. Late effect of cerebrovascular accident  5. Acquired hypothyroidism Labs pending - Thyroid  Panel With TSH - levothyroxine  (SYNTHROID ) 88 MCG tablet; Take 1 tablet (88 mcg total) by mouth daily.  Dispense: 90 tablet; Refill: 1  6. BMI 30.0-30.9,adult Discussed diet and exercise for person with BMI >25 Will recheck weight in 3-6 months   7. Encounter for screening colonoscopy Referral made - Ambulatory referral to Gastroenterology   Labs pending Health Maintenance reviewed Diet and exercise encouraged  Follow up plan: 6  months   Brooke Gladis, FNP

## 2024-10-09 NOTE — Patient Instructions (Signed)
 Exercising to Stay Healthy To become healthy and stay healthy, it is recommended that you do moderate-intensity and vigorous-intensity exercise. You can tell that you are exercising at a moderate intensity if your heart starts beating faster and you start breathing faster but can still hold a conversation. You can tell that you are exercising at a vigorous intensity if you are breathing much harder and faster and cannot hold a conversation while exercising. How can exercise benefit me? Exercising regularly is important. It has many health benefits, such as: Improving overall fitness, flexibility, and endurance. Increasing bone density. Helping with weight control. Decreasing body fat. Increasing muscle strength and endurance. Reducing stress and tension, anxiety, depression, or anger. Improving overall health. What guidelines should I follow while exercising? Before you start a new exercise program, talk with your health care provider. Do not exercise so much that you hurt yourself, feel dizzy, or get very short of breath. Wear comfortable clothes and wear shoes with good support. Drink plenty of water while you exercise to prevent dehydration or heat stroke. Work out until your breathing and your heartbeat get faster (moderate intensity). How often should I exercise? Choose an activity that you enjoy, and set realistic goals. Your health care provider can help you make an activity plan that is individually designed and works best for you. Exercise regularly as told by your health care provider. This may include: Doing strength training two times a week, such as: Lifting weights. Using resistance bands. Push-ups. Sit-ups. Yoga. Doing a certain intensity of exercise for a given amount of time. Choose from these options: A total of 150 minutes of moderate-intensity exercise every week. A total of 75 minutes of vigorous-intensity exercise every week. A mix of moderate-intensity and  vigorous-intensity exercise every week. Children, pregnant women, people who have not exercised regularly, people who are overweight, and older adults may need to talk with a health care provider about what activities are safe to perform. If you have a medical condition, be sure to talk with your health care provider before you start a new exercise program. What are some exercise ideas? Moderate-intensity exercise ideas include: Walking 1 mile (1.6 km) in about 15 minutes. Biking. Hiking. Golfing. Dancing. Water aerobics. Vigorous-intensity exercise ideas include: Walking 4.5 miles (7.2 km) or more in about 1 hour. Jogging or running 5 miles (8 km) in about 1 hour. Biking 10 miles (16.1 km) or more in about 1 hour. Lap swimming. Roller-skating or in-line skating. Cross-country skiing. Vigorous competitive sports, such as football, basketball, and soccer. Jumping rope. Aerobic dancing. What are some everyday activities that can help me get exercise? Yard work, such as: Child psychotherapist. Raking and bagging leaves. Washing your car. Pushing a stroller. Shoveling snow. Gardening. Washing windows or floors. How can I be more active in my day-to-day activities? Use stairs instead of an elevator. Take a walk during your lunch break. If you drive, park your car farther away from your work or school. If you take public transportation, get off one stop early and walk the rest of the way. Stand up or walk around during all of your indoor phone calls. Get up, stretch, and walk around every 30 minutes throughout the day. Enjoy exercise with a friend. Support to continue exercising will help you keep a regular routine of activity. Where to find more information You can find more information about exercising to stay healthy from: U.S. Department of Health and Human Services: ThisPath.fi Centers for Disease Control and Prevention (  CDC): FootballExhibition.com.br Summary Exercising regularly is  important. It will improve your overall fitness, flexibility, and endurance. Regular exercise will also improve your overall health. It can help you control your weight, reduce stress, and improve your bone density. Do not exercise so much that you hurt yourself, feel dizzy, or get very short of breath. Before you start a new exercise program, talk with your health care provider. This information is not intended to replace advice given to you by your health care provider. Make sure you discuss any questions you have with your health care provider. Document Revised: 04/04/2021 Document Reviewed: 04/04/2021 Elsevier Patient Education  2024 [employer].

## 2024-10-10 ENCOUNTER — Ambulatory Visit: Payer: Self-pay | Admitting: Nurse Practitioner

## 2024-10-10 LAB — CBC WITH DIFFERENTIAL/PLATELET
Basophils Absolute: 0 x10E3/uL (ref 0.0–0.2)
Basos: 0 %
EOS (ABSOLUTE): 0.1 x10E3/uL (ref 0.0–0.4)
Eos: 1 %
Hematocrit: 44.8 % (ref 34.0–46.6)
Hemoglobin: 14.4 g/dL (ref 11.1–15.9)
Immature Grans (Abs): 0 x10E3/uL (ref 0.0–0.1)
Immature Granulocytes: 0 %
Lymphocytes Absolute: 1.5 x10E3/uL (ref 0.7–3.1)
Lymphs: 21 %
MCH: 30.1 pg (ref 26.6–33.0)
MCHC: 32.1 g/dL (ref 31.5–35.7)
MCV: 94 fL (ref 79–97)
Monocytes Absolute: 0.5 x10E3/uL (ref 0.1–0.9)
Monocytes: 6 %
Neutrophils Absolute: 5.2 x10E3/uL (ref 1.4–7.0)
Neutrophils: 72 %
Platelets: 333 x10E3/uL (ref 150–450)
RBC: 4.79 x10E6/uL (ref 3.77–5.28)
RDW: 14.2 % (ref 11.7–15.4)
WBC: 7.3 x10E3/uL (ref 3.4–10.8)

## 2024-10-10 LAB — CMP14+EGFR
ALT: 24 IU/L (ref 0–32)
AST: 28 IU/L (ref 0–40)
Albumin: 4.4 g/dL (ref 3.9–4.9)
Alkaline Phosphatase: 105 IU/L (ref 49–135)
BUN/Creatinine Ratio: 10 — AB (ref 12–28)
BUN: 8 mg/dL (ref 8–27)
Bilirubin Total: 0.4 mg/dL (ref 0.0–1.2)
CO2: 24 mmol/L (ref 20–29)
Calcium: 9.9 mg/dL (ref 8.7–10.3)
Chloride: 104 mmol/L (ref 96–106)
Creatinine, Ser: 0.8 mg/dL (ref 0.57–1.00)
Globulin, Total: 2.6 g/dL (ref 1.5–4.5)
Glucose: 101 mg/dL — AB (ref 70–99)
Potassium: 5 mmol/L (ref 3.5–5.2)
Sodium: 140 mmol/L (ref 134–144)
Total Protein: 7 g/dL (ref 6.0–8.5)
eGFR: 82 mL/min/1.73 (ref >59)

## 2024-10-10 LAB — THYROID PANEL WITH TSH
Free Thyroxine Index: 2.9 (ref 1.2–4.9)
T3 Uptake Ratio: 27 % (ref 24–39)
T4, Total: 10.6 ug/dL (ref 4.5–12.0)
TSH: 4.52 u[IU]/mL — AB (ref 0.450–4.500)

## 2024-10-10 LAB — LIPID PANEL
Cholesterol, Total: 211 mg/dL — AB (ref 100–199)
HDL: 58 mg/dL (ref >39)
LDL CALC COMMENT:: 3.6 ratio (ref 0.0–4.4)
LDL Chol Calc (NIH): 138 mg/dL — AB (ref 0–99)
Triglycerides: 86 mg/dL (ref 0–149)
VLDL Cholesterol Cal: 15 mg/dL (ref 5–40)

## 2024-10-10 LAB — VITAMIN D 25 HYDROXY (VIT D DEFICIENCY, FRACTURES): Vit D, 25-Hydroxy: 22.9 ng/mL — AB (ref 30.0–100.0)

## 2024-10-11 LAB — CYTOLOGY - PAP
Comment: NEGATIVE
Diagnosis: NEGATIVE
High risk HPV: NEGATIVE

## 2024-11-20 ENCOUNTER — Other Ambulatory Visit: Payer: Self-pay | Admitting: Nurse Practitioner

## 2024-11-20 ENCOUNTER — Other Ambulatory Visit: Payer: Self-pay

## 2024-11-20 DIAGNOSIS — R7989 Other specified abnormal findings of blood chemistry: Secondary | ICD-10-CM

## 2024-11-20 DIAGNOSIS — Z13820 Encounter for screening for osteoporosis: Secondary | ICD-10-CM

## 2024-11-20 DIAGNOSIS — Z780 Asymptomatic menopausal state: Secondary | ICD-10-CM

## 2024-11-21 ENCOUNTER — Ambulatory Visit: Payer: Self-pay | Admitting: Nurse Practitioner

## 2024-11-21 LAB — THYROID PANEL WITH TSH
Free Thyroxine Index: 3.3 (ref 1.2–4.9)
T3 Uptake Ratio: 28 % (ref 24–39)
T4, Total: 11.8 ug/dL (ref 4.5–12.0)
TSH: 2.68 u[IU]/mL (ref 0.450–4.500)

## 2024-11-23 ENCOUNTER — Ambulatory Visit: Payer: Self-pay | Admitting: Nurse Practitioner

## 2024-11-24 ENCOUNTER — Telehealth: Payer: Self-pay

## 2024-11-24 NOTE — Telephone Encounter (Signed)
 Copied from CRM #[id number]. Topic: Clinical - Lab/Test Results >> Nov 24, 2024 10:04 AM Diannia H wrote: Reason for CRM: Patient called in regarding labs dexa results, patient understood and had no further questions

## 2024-11-24 NOTE — Telephone Encounter (Signed)
Documented in result notes. 

## 2024-12-05 ENCOUNTER — Other Ambulatory Visit: Payer: Self-pay | Admitting: Nurse Practitioner

## 2024-12-05 DIAGNOSIS — I10 Essential (primary) hypertension: Secondary | ICD-10-CM

## 2024-12-07 ENCOUNTER — Other Ambulatory Visit: Payer: Self-pay | Admitting: Nurse Practitioner

## 2024-12-07 DIAGNOSIS — E039 Hypothyroidism, unspecified: Secondary | ICD-10-CM

## 2025-02-20 ENCOUNTER — Encounter: Admitting: Nurse Practitioner

## 2025-04-06 ENCOUNTER — Ambulatory Visit: Payer: Self-pay | Admitting: Nurse Practitioner
